# Patient Record
Sex: Female | Born: 1955 | Race: White | Hispanic: No | Marital: Married | Smoking: Former smoker
Health system: Southern US, Community
[De-identification: ages and names within clinical notes are randomized; demographics above are authoritative.]

## PROBLEM LIST (undated history)

## (undated) DIAGNOSIS — I671 Cerebral aneurysm, nonruptured: Secondary | ICD-10-CM

## (undated) DIAGNOSIS — Z9221 Personal history of antineoplastic chemotherapy: Secondary | ICD-10-CM

## (undated) DIAGNOSIS — C50919 Malignant neoplasm of unspecified site of unspecified female breast: Secondary | ICD-10-CM

## (undated) DIAGNOSIS — F419 Anxiety disorder, unspecified: Secondary | ICD-10-CM

## (undated) DIAGNOSIS — I720 Aneurysm of carotid artery: Secondary | ICD-10-CM

## (undated) DIAGNOSIS — M8430XA Stress fracture, unspecified site, initial encounter for fracture: Secondary | ICD-10-CM

## (undated) DIAGNOSIS — T7840XA Allergy, unspecified, initial encounter: Secondary | ICD-10-CM

## (undated) DIAGNOSIS — R232 Flushing: Secondary | ICD-10-CM

## (undated) DIAGNOSIS — C801 Malignant (primary) neoplasm, unspecified: Secondary | ICD-10-CM

## (undated) DIAGNOSIS — N951 Menopausal and female climacteric states: Secondary | ICD-10-CM

## (undated) DIAGNOSIS — Z79810 Long term (current) use of selective estrogen receptor modulators (SERMs): Secondary | ICD-10-CM

## (undated) DIAGNOSIS — Z923 Personal history of irradiation: Secondary | ICD-10-CM

## (undated) DIAGNOSIS — M1990 Unspecified osteoarthritis, unspecified site: Secondary | ICD-10-CM

## (undated) HISTORY — PX: PARTIAL HYSTERECTOMY: SHX80

## (undated) HISTORY — DX: Aneurysm of carotid artery: I72.0

## (undated) HISTORY — PX: OTHER SURGICAL HISTORY: SHX169

## (undated) HISTORY — DX: Cerebral aneurysm, nonruptured: I67.1

## (undated) HISTORY — DX: Malignant (primary) neoplasm, unspecified: C80.1

## (undated) HISTORY — DX: Personal history of antineoplastic chemotherapy: Z92.21

## (undated) HISTORY — DX: Stress fracture, unspecified site, initial encounter for fracture: M84.30XA

## (undated) HISTORY — DX: Allergy, unspecified, initial encounter: T78.40XA

## (undated) HISTORY — DX: Malignant neoplasm of unspecified site of unspecified female breast: C50.919

## (undated) HISTORY — DX: Menopausal and female climacteric states: N95.1

---
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
## 1979-02-17 HISTORY — PX: ABDOMINAL HYSTERECTOMY: SHX81

## 1998-12-25 ENCOUNTER — Emergency Department (HOSPITAL_COMMUNITY): Admission: EM | Admit: 1998-12-25 | Discharge: 1998-12-25 | Payer: Self-pay | Admitting: *Deleted

## 1998-12-25 ENCOUNTER — Encounter: Payer: Self-pay | Admitting: *Deleted

## 2003-02-02 ENCOUNTER — Emergency Department (HOSPITAL_COMMUNITY): Admission: EM | Admit: 2003-02-02 | Discharge: 2003-02-02 | Payer: Self-pay | Admitting: Emergency Medicine

## 2008-06-29 ENCOUNTER — Ambulatory Visit (HOSPITAL_BASED_OUTPATIENT_CLINIC_OR_DEPARTMENT_OTHER): Admission: RE | Admit: 2008-06-29 | Discharge: 2008-06-29 | Payer: Self-pay | Admitting: Orthopedic Surgery

## 2008-06-29 ENCOUNTER — Encounter (INDEPENDENT_AMBULATORY_CARE_PROVIDER_SITE_OTHER): Payer: Self-pay | Admitting: Orthopedic Surgery

## 2008-06-29 HISTORY — PX: TRIGGER FINGER RELEASE: SHX641

## 2009-11-09 ENCOUNTER — Ambulatory Visit: Payer: Self-pay | Admitting: Sports Medicine

## 2009-11-09 DIAGNOSIS — M79609 Pain in unspecified limb: Secondary | ICD-10-CM | POA: Insufficient documentation

## 2009-11-09 DIAGNOSIS — M84376A Stress fracture, unspecified foot, initial encounter for fracture: Secondary | ICD-10-CM | POA: Insufficient documentation

## 2009-11-09 DIAGNOSIS — M2010 Hallux valgus (acquired), unspecified foot: Secondary | ICD-10-CM | POA: Insufficient documentation

## 2009-11-09 HISTORY — DX: Stress fracture, unspecified foot, initial encounter for fracture: M84.376A

## 2010-01-17 ENCOUNTER — Ambulatory Visit: Payer: Self-pay | Admitting: Sports Medicine

## 2010-07-20 NOTE — Assessment & Plan Note (Signed)
Summary: NP,FOOT PAIN,MC   Vital Signs:  Patient profile:   55 year old female Height:      67 inches Weight:      182 pounds BMI:     28.61 BP sitting:   142 / 82  Vitals Entered By: Lillia Pauls CMA (Nov 09, 2009 2:41 PM)  History of Present Illness: This patient has had chronic lat foot pain Hx of multiple knee surgeries on left played a lot of softball and sports  old 3/4 length rigid orthotic  off the shelf sports inserts recently felt OK but not comfortable with current foot prob  4 weeks severe outer foot pain xrays neg at Dr Margreta Journey and he noted still poss stress fx  last week had 2 treatments with acupenture which helped significantly   went back to [facility] w worse pain cam walker placed 2 days ago? felt better stood 8 hrs p this and now with a lot of pain  comes for eval   Allergies (verified): No Known Drug Allergies  Physical Exam  General:  Well-developed,well-nourished,in no acute distress; alert,appropriate and cooperative throughout examination Msk:  2nd, 3rd toe riding over the 1st  1st toe has hallux vallugas  transverse arch collapse callusing under 3rd and 4th metatarsal  pain on palpation of distal shaft of 5th  pain under metatarsal heads of 3rd, 4th   Additional Exam:  MSK US  shows a partial callus and thickening of cortex of distal 5th MT before MTP this is noted on long and trans view edema noted as well no inc in doppler noted 4th MT loosk normal  images saved   Impression & Recommendations:  Problem # 1:  FOOT PAIN, LEFT (ICD-729.5)  This was much improved with placement of MT support into her CROCs very point tender over dorsum whjere we see callus not as much over MT heads or shaft to suggest metatarsalgia though  Orders: [place] LIMITED ([id number])  Problem # 2:  STRESS FRACTURE OF THE METATARSALS (ICD-733.94)  scan and exam very consistent w this  note seems that this must relate to abnormal foot structure and  breakdown  Orders: [place] LIMITED ([id number])  Problem # 3:  HALLUX VALGUS (ICD-735.0) left foot shows lots of transverse arch change and shift ot outside of foot bunion and lat subluxation  will need custom orthotic at some point  reck as noted or sooner for probs  Patient Instructions: 1)  calcium 2000 mg under instructions 2)  vitamin 800 international units  3)  ice it 5-10 min/day  4)  don't stand more than have to  5)  when tired, use camwalker for a while  6)  try not to walk barefoot without support  7)  take 4-6 weeks to heal 8)  recheck in 4-6 weeks

## 2010-07-20 NOTE — Assessment & Plan Note (Signed)
Summary: F/U FOOT,MC   Vital Signs:  Patient profile:   55 year old female BP sitting:   120 / 75  Vitals Entered By: Lillia Pauls CMA (January 17, 2010 10:18 AM)  History of Present Illness: Patient presented a little over a month ago with lateral left foot pain and ultrasound findings that were consistent with a distal fifth metatarsal stress fracture. Overall, the patient reports that she is feeling some small improvement. She used the metatarsal pad for four weeks with initial pain relief, but then increasing soreness that was not tolerable.  Patient has most pain with plantarflexion of left foot. Cam walker seems to help. She is not taking any NSAIDs at the moment, but has used celebrex in past which was helpful, and she is interested in using this again.  Still walking and standing quite a bit, building cornhole boards. 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  Allergies: No Known Drug Allergies  Physical Exam  General:  Well-developed,well-nourished,in no acute distress; alert,appropriate and cooperative throughout examination Msk:  First toe with hallux vallugas, bilaterally and right-sided hallux bunion.  Transverse arch collapse bilaterally with tendency toward supination, as evidenced by callouses on lateral foot and toes.  Pain on palpation of distal shaft of 5th metatarsal without evidence of significant erythema or edema. Additional Exam:  Ultrasound: Proximal 5th MT shows some mild callous formation. Distal 5th MTshows more significant callous formation that has improved and resolved hematoma. No increase in doppler flow.  Images saved.   Impression & Recommendations:  Problem # 1:  FOOT PAIN, LEFT (ICD-729.5)  Patient's foot pain is secondary to stress fracture of the 5th MT of the left foot. She may have started to develop some slight stress fracture reaction on the proximal portion of that metatarsal secondary to increased pressure to the area while  using the MT pad. We will discontinue use of the MT pad and try an arch strap to help with stabilization and healing. May consider orthotics in the future, after actue pain is resolved, to address structural and gait abnormailities that increase her risk of developing stress fractures. She can use celebrex 200mg  one tab daily, as needed for the pain.  Orders: [place] LIMITED ([id number])  Problem # 2:  STRESS FRACTURE OF THE METATARSALS (ICD-733.94)  The clinical and ultrasound exam show some improvement in distal left 5th MT stress fracture. As the patient did not tolerate the metatarsal pad, and sincer her pain is improved, will try the arch strap for support while standing or walking th stabilize the metatarsal for improved healing.  Orders: [place] LIMITED ([id number])  Problem # 3:  HALLUX VALGUS (ICD-735.0) This patient has transverse arch collapse and hallux valgus that are putting increaing pressure on the lateral edges of her feet. Will consider custom orthotics after acute pain is resolved.  Complete Medication List: 1)  Celebrex 200 Mg Caps (Celecoxib) .Marland Kitchen.. 1 by mouth once daily as needed for pain Prescriptions: CELEBREX 200 MG CAPS (CELECOXIB) 1 by mouth once daily as needed for pain  #30 x 4   Entered by:   Lillia Pauls CMA   Authorized by:   Enid Baas MD   Signed by:   Lillia Pauls CMA on 01/17/2010   Method used:   Electronically to        [id number] (retail)       [address]       [place], [place]  [address]  Ph: [phone] or [phone]       Fax: [phone]   RxID:   [id number]

## 2010-08-10 ENCOUNTER — Observation Stay (HOSPITAL_COMMUNITY)
Admission: EM | Admit: 2010-08-10 | Discharge: 2010-08-11 | Disposition: A | Discharge status: Home / Self Care | Payer: 59 | Attending: Internal Medicine | Admitting: Internal Medicine

## 2010-08-10 ENCOUNTER — Emergency Department (HOSPITAL_COMMUNITY): Payer: 59

## 2010-08-10 DIAGNOSIS — R110 Nausea: Secondary | ICD-10-CM | POA: Insufficient documentation

## 2010-08-10 DIAGNOSIS — Z8249 Family history of ischemic heart disease and other diseases of the circulatory system: Secondary | ICD-10-CM | POA: Insufficient documentation

## 2010-08-10 DIAGNOSIS — F17200 Nicotine dependence, unspecified, uncomplicated: Secondary | ICD-10-CM | POA: Insufficient documentation

## 2010-08-10 DIAGNOSIS — R079 Chest pain, unspecified: Principal | ICD-10-CM | POA: Insufficient documentation

## 2010-08-10 LAB — LIPID PANEL
Cholesterol: 167 mg/dL (ref 0–200)
HDL: 36 mg/dL — ABNORMAL LOW (ref >39)
LDL Cholesterol: 101 mg/dL — ABNORMAL HIGH (ref 0–99)
Total CHOL/HDL Ratio: 4.6 RATIO

## 2010-08-10 LAB — DIFFERENTIAL
Basophils Absolute: 0 10*3/uL (ref 0.0–0.1)
Basophils Relative: 1 % (ref 0–1)
Eosinophils Absolute: 0.1 10*3/uL (ref 0.0–0.7)
Eosinophils Relative: 2 % (ref 0–5)
Lymphocytes Relative: 22 % (ref 12–46)
Lymphs Abs: 1.5 10*3/uL (ref 0.7–4.0)
Monocytes Absolute: 0.3 10*3/uL (ref 0.1–1.0)
Monocytes Relative: 4 % (ref 3–12)
Neutro Abs: 4.6 10*3/uL (ref 1.7–7.7)
Neutrophils Relative %: 72 % (ref 43–77)

## 2010-08-10 LAB — POCT CARDIAC MARKERS
CKMB, poc: 2.4 ng/mL (ref 1.0–8.0)
Myoglobin, poc: 57.5 ng/mL (ref 12–200)
Troponin i, poc: <0.05 ng/mL (ref 0.00–0.09)

## 2010-08-10 LAB — CBC
HCT: 39 % (ref 36.0–46.0)
Hemoglobin: 13.8 g/dL (ref 12.0–15.0)
MCH: 31.8 pg (ref 26.0–34.0)
MCHC: 35.4 g/dL (ref 30.0–36.0)
MCV: 89.9 fL (ref 78.0–100.0)
Platelets: 276 10*3/uL (ref 150–400)
RBC: 4.34 MIL/uL (ref 3.87–5.11)
RDW: 12.5 % (ref 11.5–15.5)
WBC: 6.5 10*3/uL (ref 4.0–10.5)

## 2010-08-10 LAB — CARDIAC PANEL(CRET KIN+CKTOT+MB+TROPI)
CK, MB: 4.3 ng/mL — ABNORMAL HIGH (ref 0.3–4.0)
Total CK: 111 U/L (ref 7–177)
Troponin I: 0.01 ng/mL (ref 0.00–0.06)

## 2010-08-10 LAB — COMPREHENSIVE METABOLIC PANEL
ALT: 26 U/L (ref 0–35)
AST: 24 U/L (ref 0–37)
Calcium: 9 mg/dL (ref 8.4–10.5)
GFR calc Af Amer: >60 mL/min (ref >60)
Sodium: 140 mEq/L (ref 135–145)
Total Protein: 6.6 g/dL (ref 6.0–8.3)

## 2010-08-10 LAB — D-DIMER, QUANTITATIVE: D-Dimer, Quant: 0.38 ug/mL-FEU (ref 0.00–0.48)

## 2010-08-10 LAB — CK TOTAL AND CKMB (NOT AT ARMC): Relative Index: 2.8 — ABNORMAL HIGH (ref 0.0–2.5)

## 2010-08-10 LAB — TROPONIN I: Troponin I: 0.01 ng/mL (ref 0.00–0.06)

## 2010-08-10 LAB — GLUCOSE, CAPILLARY: Glucose-Capillary: 113 mg/dL — ABNORMAL HIGH (ref 70–99)

## 2010-08-10 LAB — T3 UPTAKE: T3 Uptake Ratio: 32.2 % (ref 22.5–37.0)

## 2010-08-10 NOTE — H&P (Signed)
NAME:  Emily Knight, MYHRE NO.:  [id number]  MEDICAL RECORD NO.:  [id number]           PATIENT TYPE:  E  LOCATION:  [facility]                         FACILITY:  [facility]  PHYSICIAN:  [name], MD  DATE OF BIRTH:  1956/06/13  DATE OF ADMISSION:  08/10/2010 DATE OF DISCHARGE:                             HISTORY & PHYSICAL   PRIMARY CARE PHYSICIAN:  [name], MD.  History obtainable from the patient.  CHIEF COMPLAINT:  Chest pain, which started today at about 7:30 a.m.  HISTORY OF PRESENT ILLNESS:  The patient is a very pleasant 55 year old Caucasian female with history of cerebral aneurysm, presenting to the emergency room with chest pain, which started at about 7:30 a.m. when the patient woke up from sleep.  The patient claimed that for some months now she has been having a lot of precordial discomfort, which has been intermittent; however, the present episode started when the patient woke up from sleep.  Pain is described as sharp, located in the precordial region about 10/10 intensity radiating to the neck to the left shoulder and also to the left hand with tingling.  Pain was said to be made worse by movement.  She claims she was nauseated, but denied any vomiting.  She denied any diaphoresis.  She denied any fever.  She denied any chills.  She denied any rigor.  Pain was said to have persisted, hence the patient presented to the emergency room to be evaluated.  PAST MEDICAL HISTORY:  Positive for cerebral aneurysm.  PAST SURGICAL HISTORY:  Left knee surgery 3 open and 3 arthroscopically.  MEDICATIONS:  Preadmission meds include, 1. Celebrex 20 mg one p.o. daily p.r.n. 2. Premarin (conjugated estrogens) 0.625 mg 1 p.o. daily.  ALLERGIES:  CODEINE.  SOCIAL HISTORY:  Negative for tobacco use.  Takes alcohol socially and she is a retired Emergency planning/management officer.  FAMILY HISTORY:  York Spaniel to be positive for coronary artery disease. Mother had an open heart  surgery.  REVIEW OF SYSTEMS:  The patient denies any history of headaches.  No blurred vision.  No nausea or vomiting.  No fever.  No chills.  No rigor.  The precordial pain is moderate in intensity.  She denies any systemic symptoms.  No fever.  No chills. No rigors.  She denies any cough.  No abdominal discomfort.  No diarrhea or hematochezia.  No dysuria or hematuria.  No swelling of the lower extremities.  No intolerance to heat or cold and no neuropsychiatric disorder.  PHYSICAL EXAMINATION:  GENERAL:  On examination, middle-aged lady, very pleasant, not in any obvious respiratory distress, well-hydrated. PRESENT VITAL SIGNS:  Blood pressure is 130/82, pulse is 83, respiratory rate 18, temperature is 97.7. HEENT:  Pupils are reactive to light.  Extraocular muscles are intact. NECK:  No jugular venous distention.  No carotid bruit.  No lymphadenopathy. CHEST:  Clear to auscultation. CARDIAC:  Heart Sounds are 1 and 2. ABDOMEN:  Soft and nontender.  Liver, spleen and kidney not palpable. Bowel sounds positive. EXTREMITIES:  No pedal edema. NEUROLOGIC EXAM:  Nonfocal. MUSCULOSKELETAL SYSTEM:  Show arthritic changes in the knees.  NEUROPSYCHIATRIC EVALUATION:  Unremarkable. SKIN:  Showed normal turgor.  LABORATORY DATA:  BNP 32.  First set of cardiac markers troponin-I is 0.01.  Chemistry showed sodium of 140, potassium of 3.9, chloride of 109, bicarb of 23, glucose is 101, BUN is 16, creatinine is 0.69.  LFT normal.  Hematological indices showed WBC of 6.5, hemoglobin of 13.8, hematocrit 39.0, MCV 89.9 with a platelet count of 276, normal differentials.  EKG shows sinus bradycardia with a rate of 56.  No ST- wave elevation or depression noted.  Chest x-ray, not done.  ADMITTING IMPRESSION:  Chest pain, rule out for acute coronary syndrome. Other medical problem: cerebral aneurysm.  PLAN:  Admit the patient to telemetry.  The patient will be given O2 via nasal cannula three  times per minute, aspirin 325 mg p.o. daily, nitroglycerin 0.4 mg sublingual p.r.n. for chest pain, and morphine 2 mg IV q.4 p.r.n. and she will be on Protonix 40 mg IV q.24 for GI prophylaxis and heparin 5000 units subcu daily for DVT prophylaxis. Labs ordered on this patient will include cardiac enzymes q.6 x3.  Lipid panel and thyroid panel, which include TSH, T3, and T4 and also D-dimer stat.  Imaging study to be ordered will include 2-D echo, carotid duplex.  Chest x-ray stat and CT thorax with PE protocol. Ultimately when all the results are obtained, the patient will be evaluated by the cardiologist for possible stress test either as inpatient or outpatient. The patient will be followed and evaluated on day-to-day basis.     [name], MD     CN/MEDQ  D:  08/10/2010  T:  08/10/2010  Job:  [id number]  Electronically Signed by [name]  on 08/10/2010 05:11:20 PM

## 2010-08-11 ENCOUNTER — Inpatient Hospital Stay (HOSPITAL_COMMUNITY): Payer: 59

## 2010-08-11 LAB — CARDIAC PANEL(CRET KIN+CKTOT+MB+TROPI)
CK, MB: 3.3 ng/mL (ref 0.3–4.0)
Relative Index: INVALID (ref 0.0–2.5)
Total CK: 78 U/L (ref 7–177)
Troponin I: 0.01 ng/mL (ref 0.00–0.06)

## 2010-08-12 NOTE — Discharge Summary (Signed)
NAME:  Emily Knight, Emily Knight NO.:  [id number]  MEDICAL RECORD NO.:  [id number]           PATIENT TYPE:  I  LOCATION:  3715                         FACILITY:  [facility]  PHYSICIAN:  [name], MD  DATE OF BIRTH:  06/05/1956  DATE OF ADMISSION:  08/10/2010 DATE OF DISCHARGE:  08/11/2010                        DISCHARGE SUMMARY - REFERRING   PRIMARY CARE PHYSICIAN:  [name], MD.  CONSULTANT CARDIOLOGY:  [name], MD  DISCHARGE DIAGNOSIS: 1. Chest pain, questionable unstable angina. 2. History of cerebral aneurysm.  HISTORY OF PRESENT ILLNESS:  The patient is a very pleasant 55 year old Caucasian female with history of cerebral aneurysm, was admitted to the hospital on August 10, 2010 with precordial chest pain, radiating to the neck and to the left shoulder with tingling sensation in the left arm.  This was associated with nausea, but no vomiting.  She denied any diaphoresis.  She denied any systemic symptoms i.e. no fever, no chills, no rigor.  The pain was said to have persisted, hence the patient presented to the Emergency Room to be evaluated.  PREADMISSION MEDICATIONS:  Include: 1. Celebrex 20 mg p.o. daily. 2. Premarin (conjugated estrogens) 0.625 mg 1 p.o. daily.  ALLERGIES:  CODEINE.  PAST SURGICAL HISTORY:  Left knee surgeries 6 in total, 3 open and 3 arthroscopically.  SOCIAL HISTORY:  Negative for tobacco use.  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  FAMILY HISTORY:  Positive for coronary artery disease.  REVIEW OF SYSTEMS:  Essentially as documented initial history and physical.  PHYSICAL EXAMINATION:  GENERAL:  At time the patient was seen by me she was not in any distress, well-hydrated. VITAL SIGNS:  Blood pressure is 130/82, pulse 83, respiratory rate 18, temperature is 97.7. HEENT:  Pupils are reactive to light.  Extraocular muscles intact. NECK:  No jugular venous distention.  No carotid  bruits.  No lymphadenopathy. CHEST:  Clear to auscultation. HEART:  Heart sounds are 1 and 2. ABDOMEN:  Soft and nontender.  Liver, spleen, and kidney are not palpable.  Bowel sounds are positive. EXTREMITIES: No pedal edema. NEUROLOGIC:  Nonfocal. MUSCULOSKELETAL:  Unremarkable. SKIN:  Normal turgor.  LABORATORY DATA:  Initial complete blood count with differential showed a WBC of 6.5, hemoglobin 13.8, hematocrit 39.0, MCV 89.9 with a platelet count of 276, normal differentials.  First set of cardiac markers troponin-I less than 0.05, 0.01, point less than 0.01 respectively.  BNP 32, D-dimer 0.38.  Lipid panel showed total cholesterol was 67, triglyceride 149, HDL 36, LDL 101.  Thyroid panel; TSH 1.409 normal, T3 of 32.3 normal, and T4 of 8.4 normal.  Imaging studies done include chest x-ray, which showed emphysematous changes in the upper lungs, no evidence of active cardiopulmonary disease.  A 2-D echo was cancelled by the cardiologist Dr. Anne Fu and this was agreeable by me.  HOSPITAL COURSE:  The patient was admitted to telemetry.  She was given aspirin 325 mg p.o. daily.  She was placed on O2 via nasal cannula 2 L per minute and this was eventually discontinued and she was also given nitro 0.4 mg sublingual p.r.n.,  pain control was with morphine, and GI prophylaxis with Protonix and DVT prophylaxis with heparin 5000 subcu q.12 hours.  The patient was also given Vicodin for pain control.  She was evaluated by the in-house cardiologist Dr. Anne Fu who recommended outpatient stress test.  The patient was reevaluated by me today, chest pain free.  Cardiac enzymes are negative.  Examination unremarkable. Vital signs, blood pressure is 107/68, temperature of 97.3, pulse 66, respiratory rate 20.  Medically stable.  PLAN:  For the patient to be discharged home today.  DISCHARGE ACTIVITIES:  As tolerated.  DISCHARGE DIET:  Low-sodium, low-cholesterol diet.  DISCHARGE  INSTRUCTIONS:  She will have a stress test done in  Cardiologist Office on Monday the August 14, 2010.  DISCHARGE MEDICATIONS:  Medication to be taken at home include: 1. Aspirin 325 mg one p.o. daily. 2. Nitroglycerin 0.4 mg sublingual p.r.n. 3. Celebrex 20 mg p.o. daily p.r.n. 4. Premarin (conjugated estrogens) 0.625 mg 1 p.o. daily.     [name], MD     CN/MEDQ  D:  08/11/2010  T:  08/11/2010  Job:  [id number]  cc:   [name], M.D.  Electronically Signed by [name]  on 08/12/2010 06:28:52 AM

## 2010-08-14 NOTE — Consult Note (Signed)
NAMEMarland Knight  Knight, Emily NO.:  [id number]  MEDICAL RECORD NO.:  [id number]           PATIENT TYPE:  I  LOCATION:  3715                         FACILITY:  [facility]  PHYSICIAN:  [name], MD      DATE OF BIRTH:  06/23/1955  DATE OF CONSULTATION: DATE OF DISCHARGE:  08/11/2010                                CONSULTATION   CARDIOLOGIST:  [name], MD  PRIMARY CARE PHYSICIAN:  [name] with [name].  REASON FOR CONSULTATION:  Evaluation of chest pain at the request of Dr. [name], [facility].  HISTORY OF PRESENT ILLNESS:  A 55 year old female, former smoker, nondiabetic, no hypertension, LDL 101 who over the past month has been experiencing sharp substernal chest discomfort at rest while sitting that has been gaining in frequency and intensity.  One episode occurred, for instance,  while sitting in her car and was quite intense.  The episode that brought her to the hospital however occurred after she woke up in the morning, went to the [employer] for breakfast, then sat down and began having quite severe substernal sharp chest pain.  She also woke up at that time with some left arm discomfort but she attributed this to lifting the day prior.  She did not experience any shortness of breath or syncope or dizziness with this discomfort.  She stated that the sharp retrosternal chest discomfort lasted for over an hour duration and was intermittent waxing and waning.  EMS was called and gave her aspirin as well as nitroglycerin and she did state that one nitroglycerin seem to ease the discomfort.  Shortly after arrival in the emergency department her chest pain was gone.  As stated above she has had this chest discomfort off and on over the past month and this has been worrisome to her.  In the year 2007, Dr. [name] had performed a nuclear stress test on her and she exercised for 11 minutes and had no evidence of ischemia on perfusion  images.  She did quit smoking in 2002.  PAST MEDICAL HISTORY: 1. Prior tobacco use - quit in 2002. 2. Hot flashes, perimenopausal - just started Premarin last month. 3. Ankle stress fracture. 4. Partial hysterectomy for fibroids in 1989. 5. Left knee surgery x6, ACL injury. 6. Cerebral aneurysm had been diagnosed previously with cerebral     angiogram.  I do not have the details but her history states that     the aneurysm was encased in bone and would not be touched.  FAMILY HISTORY:  Her brother had bypass surgery at age 76.  Her mother had bypass surgery at age 79 with aortic valve replacement.  SOCIAL HISTORY:  Rare alcohol use, quit tobacco in 2002.  No illicit drug use.  She has close friends with a rapid response nurse here at [facility] who was present during the interview.  MEDICATIONS:  Premarin just started over the past month and she has also been taking Celebrex for her ankle pain.  REVIEW OF SYSTEMS:  Denies any fever, melena, hematemesis, syncope, dizziness, rashes.  Positive for ankle pain as below.  She did have about of diarrhea  that was quite severe within the past month.  Unless expressed above, all other 12 review of systems negative.  Recent hot flashes for which she has been taking Premarin.  PHYSICAL EXAMINATION:  VITAL SIGNS:  Temperature 98.1, pulse 66, respirations 20, blood pressure 121/78 to 107/68, satting 97% on room air. GENERAL:  Alert and oriented x3 in no acute distress, pleasant. HEENT:  Eyes; well-perfused conjunctivae, EOMI, scleral icterus. NECK:  Supple.  No lymphadenopathy.  No thyromegaly.  No carotid bruits. No JVD. CARDIOVASCULAR:  Regular rate and rhythm without any appreciable murmurs, rubs or gallops.  Normal PMI. LUNGS:  Clear to auscultation bilaterally.  Normal respiratory effort. No wheezes.  No rales. ABDOMEN:  Soft, nontender, normoactive bowel sounds.  No rebound.  No guarding.  No bruits. EXTREMITIES:  No clubbing,  cyanosis or edema.  Normal distal pulses. SKIN:  Warm, dry and intact.  No rashes. GU:  Deferred. RECTAL:  Deferred. NEURO:  Nonfocal.  No tremors.  Distal pulses are equal in upper extremities bilaterally.  DATA:  Cardiac biomarkers are negative x3.  TSH is 1.4 and normal.  LDL cholesterol is 101, HDL 36, D-dimer is normal.  BNP was 32, creatinine was 0.6, potassium 3.9, white count 6.5, hemoglobin 13.8, platelets 276. Chest x-ray personally reviewed shows some emphysematous changes in the upper lungs but no evidence of active pulmonary disease.  EKG personally reviewed shows sinus rhythm with no ST-segment changes.  No other abnormalities noted.  ASSESSMENT/PLAN:  A 55 year old female with chest pain.  Chest discomfort has been increasing in frequency and severity according to the patient.  Does have some atypical features to it such as sharpness, nonexertional.  Certainly could be musculoskeletal in etiology.  However given her previous smoking history as well as her brother with bypass surgery at age 65, I do feel that further cardiac workup is warranted. I am currently setting up for a nuclear stress test/Lexiscan given her stress fracture in her ankle to be performed in the office setting. Full details will be given to her.  I have communicated this to [employer].  I have discussed with her the mild risk associated with Celebrex from a cardiovascular standpoint and I have also reassured her that there is no evidence of myocardial infarction based upon cardiac biomarkers and EKG at this point.  I do feel comfortable allowing her to be discharged and to be performing a stress test as an outpatient and I will ensure that she has nitroglycerin prescription.  In the course of any worrisome symptoms develop, she knows to call 911 or to report immediately to the emergency room.  In regards to her LDL, it is 101 and based on her current risk factors this is reasonable.  Of  course if she does end up with a diagnosis of coronary artery disease, we should treat for bolus less than 70.  Once again prior nuclear stress test in 2007 was low- risk.  If nuclear stress test is abnormal, of course, we will proceed with cardiac catheterization.  If symptoms worsened, may proceed with catheterization as well.     [name], MD     MCS/MEDQ  D:  08/11/2010  T:  08/12/2010  Job:  [id number]  cc:   [name], M.D. [name], MD  Electronically Signed by Donato Schultz MD on 08/14/2010 08:02:10 AM

## 2010-08-30 ENCOUNTER — Ambulatory Visit: Admission: RE | Admit: 2010-08-30 | Payer: 59 | Source: Ambulatory Visit | Admitting: Interventional Cardiology

## 2010-08-30 ENCOUNTER — Inpatient Hospital Stay (HOSPITAL_BASED_OUTPATIENT_CLINIC_OR_DEPARTMENT_OTHER)
Admission: RE | Admit: 2010-08-30 | Discharge: 2010-08-30 | Disposition: A | Discharge status: Home / Self Care | Payer: 59 | Source: Ambulatory Visit | Attending: Interventional Cardiology | Admitting: Interventional Cardiology

## 2010-08-30 DIAGNOSIS — R079 Chest pain, unspecified: Secondary | ICD-10-CM | POA: Insufficient documentation

## 2010-08-30 DIAGNOSIS — Z8249 Family history of ischemic heart disease and other diseases of the circulatory system: Secondary | ICD-10-CM | POA: Insufficient documentation

## 2010-10-31 NOTE — Op Note (Signed)
NAMEMarland Kitchen  Emily, Knight NO.:  [id number]   MEDICAL RECORD NO.:  [id number]          PATIENT TYPE:  AMB   LOCATION:  DSC                          FACILITY:  [facility]   PHYSICIAN:  Cindee Salt, M.D.       DATE OF BIRTH:  1956/01/09   DATE OF PROCEDURE:  DATE OF DISCHARGE:                               OPERATIVE REPORT   PREOPERATIVE DIAGNOSIS:  Release of A1 pulley and excision of flexor  sheath cyst, left thumb.   POSTOPERATIVE DIAGNOSIS:  Release of A1 pulley and excision of flexor  sheath cyst, left thumb.   OPERATION:  Release of A1 pulley and excision of cyst, left thumb.   SURGEON:  Cindee Salt, MD   ASSISTANT:  Carolyne Fiscal, RN   ANESTHESIA:  IV regional with local supplementation.   ANESTHESIOLOGIST:  Burna Forts, MD   HISTORY:  The patient is a 55 year old female with a history of a large  mass on the metacarpophalangeal joint crease of her left thumb,  triggering of the left middle finger.  This has not responded to  conservative treatment.  She has elected to undergo surgical release and  excision.  She is aware of risks and complications including infection,  recurrence of injury to arteries, nerves, tendons, incomplete relief of  symptoms, and dystrophy.  In the preoperative area, the patient is seen.  The extremity marked by both the patient and surgeon.  Antibiotic given.   PROCEDURE:  The patient was brought to the operating room where a  forearm-based IV regional anesthetic was carried out without difficulty  under the direction of Dr. Jacklynn Bue.  She was prepped using DuraPrep,  supine position, left arm.  A time-out was taken.  The incision was  made, carried down through the subcutaneous tissue.  The digital nerves  were identified immediately.  The ulnar digital nerve was found draped  over a large cystic mass.  This was freed, taken to the ulnar aspect.  The A1 pulley was identified.  A retractor was placed protecting the  neurovascular  structures and release performed to the A1 pulley in the  radial aspect.  The cyst was then excised taking care to protect the  neurovascular bundle and sent to pathology.  No further lesions were  identified.  The thumb placed through a full range of motion, no further  triggering was evident.  The wound was copiously irrigated with saline.  The skin closed with interrupted 5-0 Vicryl Rapide sutures.  Sterile  compressive dressing was applied with the thumb free.  The patient  tolerated the procedure well and was taken to the recovery room for  observation in satisfactory condition.  On deflation of the tourniquet,  all fingers of the thumb immediately pinked.  She is discharged to home  to return to [facility] in 1 week on [place] [facility].           ______________________________  Cindee Salt, M.D.    GK/MEDQ  D:  06/29/2008  T:  06/30/2008  Job:  [id number]

## 2010-12-28 NOTE — Cardiovascular Report (Signed)
  NAME:  Emily Knight, SGROI NO.:  [id number]  MEDICAL RECORD NO.:  [id number]           PATIENT TYPE:  O  LOCATION:  CATH                         FACILITY:  [facility]  PHYSICIAN:  [name], MDDATE OF BIRTH:  April 29, 1956  DATE OF PROCEDURE:  08/30/2010 DATE OF DISCHARGE:                           CARDIAC CATHETERIZATION   PRIMARY CARE PHYSICIAN:  [name], MD  PROCEDURES PERFORMED: 1. Left heart catheterization. 2. Left ventriculogram. 3. Coronary angiogram. 4. Abdominal aortogram.  OPERATOR:  [name], MD  INDICATIONS:  Refractory chest pain in a patient with significant family history of heart disease.  PROCEDURE NARRATIVE:  The risks and benefits of cardiac catheterization were explained to the patient and informed consent was obtained.  She was brought to the cath lab.  She was prepped and draped in the usual sterile fashion.  Her right groin was infiltrated with 1% lidocaine.  A 4-French sheath was placed into the right common femoral artery using the modified Seldinger technique.  Left coronary artery angiography was performed using a JL-4 pigtail catheter.  The catheter was advanced to the vessel ostium under fluoroscopic guidance.  Digital angiography was performed in multiple projections using hand injection of contrast. Right coronary artery angiography was performed using a JR-4 catheter in a similar fashion.  A pigtail catheter was advanced to the ascending aorta and across the aortic valve under fluoroscopic guidance.  A power injection of contrast was performed in the RAO projection to image the left ventricle.  The catheter was pulled back under continuous hemodynamic pressure monitoring.  The catheter was withdrawn to the abdominal aorta and a power injection of contrast was performed in the AP projection.  FINDINGS:  The left main was a large vessel, widely patent. The left circumflex is a large vessel.  There were 3  obtuse marginal vessels which were medium-sized and widely patent. The left anterior descending is a large vessel proximally.  There is a large first diagonal vessel.  The mid-to-distal LAD is a small vessel. The entire LAD system appeared angiographically widely patent. The right coronary artery is a large dominant vessel.  There are minimal luminal irregularities.  The posterolateral artery and posterior descending artery are medium-sized vessels and appear widely patent. The left ventriculogram showed normal ventricular function with an estimated ejection fraction of 60%.  There is no significant mitral regurgitation.  The ascending aorta appeared normal. The abdominal aortogram showed no abdominal aortic aneurysm.  There are bilateral single renal arteries.  There is no proximal vessel stenosis.  HEMODYNAMICS:  Left ventricular pressure 111/10 with an LVEDP of 16 mmHg.  Aortic pressure 109/61 with a mean aortic pressure of 85 mmHg.  IMPRESSION: 1. No significant coronary artery disease. 2. Normal left ventricular function. 3. No abdominal aortic aneurysm. 4. No renal artery stenosis. 5. Normal hemodynamics.  RECOMMENDATIONS:  Continue aggressive preventive therapy.     [name], MD     JSV/MEDQ  D:  08/30/2010  T:  08/31/2010  Job:  [id number]  Electronically Signed by Lance Muss MD on 10/04/2010 02:22:12 PM

## 2011-02-13 ENCOUNTER — Other Ambulatory Visit: Payer: Self-pay | Admitting: Family Medicine

## 2011-02-13 DIAGNOSIS — I729 Aneurysm of unspecified site: Secondary | ICD-10-CM

## 2011-02-14 ENCOUNTER — Other Ambulatory Visit: Payer: Self-pay | Admitting: Sports Medicine

## 2011-02-21 ENCOUNTER — Ambulatory Visit
Admission: RE | Admit: 2011-02-21 | Discharge: 2011-02-21 | Disposition: A | Discharge status: Home / Self Care | Payer: 59 | Source: Ambulatory Visit | Attending: Family Medicine | Admitting: Family Medicine

## 2011-02-21 DIAGNOSIS — I729 Aneurysm of unspecified site: Secondary | ICD-10-CM

## 2011-07-24 ENCOUNTER — Other Ambulatory Visit: Payer: Self-pay | Admitting: Radiology

## 2011-07-25 ENCOUNTER — Other Ambulatory Visit: Payer: Self-pay | Admitting: Radiology

## 2011-07-25 DIAGNOSIS — C50912 Malignant neoplasm of unspecified site of left female breast: Secondary | ICD-10-CM

## 2011-07-26 ENCOUNTER — Encounter (INDEPENDENT_AMBULATORY_CARE_PROVIDER_SITE_OTHER): Payer: Self-pay | Admitting: General Surgery

## 2011-07-26 ENCOUNTER — Ambulatory Visit (INDEPENDENT_AMBULATORY_CARE_PROVIDER_SITE_OTHER): Payer: 59 | Admitting: General Surgery

## 2011-07-26 VITALS — BP 142/88 | HR 70 | Resp 18 | Ht 67.0 in | Wt 183.0 lb

## 2011-07-26 DIAGNOSIS — C50419 Malignant neoplasm of upper-outer quadrant of unspecified female breast: Secondary | ICD-10-CM

## 2011-07-26 NOTE — Progress Notes (Signed)
Patient ID: Emily Knight, female   DOB: [birth year], 56 y.o.   MRN: [id number] HPI Emily Knight is a 56 y.o. female.  Referred by Dr. Rogelia Mire HPI This is a 56 year old female who has a history of abnormal mammograms on the right side. She was then followed up for her routine yearly exam when she had a mass in the left upper outer quadrant that was spiculated measuring 1.5 cm in diameter noted. There is some adjacent architectural distortion. Ultrasound of the left breast showed an ill-defined, hypoechoic mass in the 2:30 position about 4 cm from the nipple measuring 15 mm in diameter. There also were some cystic lesions in the 11:00 position an ultrasound of the right breast also showed several cystic-appearing lesions. She underwent ultrasound-guided core biopsy with clip placement that shows an invasive mammary carcinoma that is grade 2. This has now been confirmed as a lobular cancer after each adherent staining was performed. She reports no complaints referable to her breasts and cannot identify the mass and denies any discharge. She comes in today to discuss her options. She has an MRI scheduled for the 12th. Her prognostic panel is still pending at this time.  Past Medical History  Diagnosis Date  . Allergy   . Cancer     left breast    Past Surgical History  Procedure Date  . Left knee reconstruction     six surgeries  . Tonsillectomy and adenoidectomy 1971  . Partial hysterectomy 1984    History reviewed. No pertinent family history.  Social History History  Substance Use Topics  . Smoking status: Former Games developer  . Smokeless tobacco: Not on file  . Alcohol Use: Yes    Allergies  Allergen Reactions  . Codeine Nausea And Vomiting    No current outpatient prescriptions on file.    Review of Systems Review of Systems  Constitutional: Negative for fever, chills and unexpected weight change.  HENT: Negative for hearing loss, congestion, sore throat, trouble  swallowing and voice change.   Eyes: Negative for visual disturbance.  Respiratory: Negative for cough and wheezing.   Cardiovascular: Negative for chest pain, palpitations and leg swelling.  Gastrointestinal: Negative for nausea, vomiting, abdominal pain, diarrhea, constipation, blood in stool, abdominal distention and anal bleeding.  Genitourinary: Negative for hematuria, vaginal bleeding and difficulty urinating.  Musculoskeletal: Positive for arthralgias.  Skin: Negative for rash and wound.  Neurological: Negative for seizures, syncope and headaches.  Hematological: Negative for adenopathy. Bruises/bleeds easily.  Psychiatric/Behavioral: Negative for confusion.    Blood pressure 142/88, pulse 70, resp. rate 18, height 5\' 7"  (1.702 m), weight 183 lb (83.008 kg).  Physical Exam Physical Exam  Vitals reviewed. Constitutional: She appears well-developed and well-nourished.  Eyes: No scleral icterus.  Neck: Neck supple.  Cardiovascular: Normal rate, regular rhythm and normal heart sounds.   Pulmonary/Chest: Effort normal and breath sounds normal. She has no wheezes. She has no rales. Right breast exhibits no inverted nipple, no mass, no nipple discharge, no skin change and no tenderness. Left breast exhibits no inverted nipple, no mass, no nipple discharge, no skin change and no tenderness. Breasts are symmetrical.    Abdominal: Soft. There is no hepatomegaly.  Lymphadenopathy:    She has no cervical adenopathy.    She has no axillary adenopathy.       Right: No supraclavicular adenopathy present.       Left: No supraclavicular adenopathy present.    Data Reviewed MMG, pathology reviewed  Assessment  Left breast cancer, clinical stage I, lobular    Plan    We discussed breast conservation therapy with wire guided lumpectomy, snbx today pending results of MRI.  I think reasonable to get MRI due to breast density, lobular cancer and other abnormalities she was followed for.   We will follow up after MRI and discuss final options.  We discussed at length the pros and cons of breast MRI.   We discussed the staging and pathophysiology of breast cancer. We discussed all of the different options for treatment for breast cancer including surgery, chemotherapy, radiation therapy, Herceptin, and antiestrogen therapy.   We discussed a sentinel lymph node biopsy as she does not appear to having lymph node involvement right now. We discussed the performance of that with injection of radioactive tracer and blue dye. We discussed that she would have an incision underneath her axillary hairline. We discussed that there is a bout a 10-20% chance of having a positive node with a sentinel lymph node biopsy and we will await the permanent pathology to make any other first further decisions in terms of her treatment. One of these options might be to return to the operating room to perform an axillary lymph node dissection. We discussed about a 1-2% risk lifetime of chronic shoulder pain as well as lymphedema associated with a sentinel lymph node biopsy.  We discussed the options for treatment of the breast cancer which included lumpectomy versus a mastectomy. We discussed the performance of the lumpectomy with a wire placement. We discussed a 10-20% chance of a positive margin requiring reexcision in the operating room. We also discussed that she may need radiation therapy or antiestrogen therapy or both if she undergoes lumpectomy. We discussed the mastectomy and the postoperative care for that as well. We discussed that there is no difference in her survival whether she undergoes lumpectomy with radiation therapy or antiestrogen therapy versus a mastectomy. There is a slight difference in the local recurrence rate being 3-5% with lumpectomy and about 1% with a mastectomy. We discussed the risks of operation including bleeding, infection, possible reoperation. She understands her further therapy  will be based on what her stages at the time of her operation.

## 2011-07-31 ENCOUNTER — Ambulatory Visit
Admission: RE | Admit: 2011-07-31 | Discharge: 2011-07-31 | Disposition: A | Discharge status: Home / Self Care | Payer: 59 | Source: Ambulatory Visit | Attending: Radiology | Admitting: Radiology

## 2011-07-31 ENCOUNTER — Other Ambulatory Visit (INDEPENDENT_AMBULATORY_CARE_PROVIDER_SITE_OTHER): Payer: Self-pay | Admitting: General Surgery

## 2011-07-31 ENCOUNTER — Other Ambulatory Visit: Payer: Self-pay | Admitting: Radiology

## 2011-07-31 DIAGNOSIS — C50912 Malignant neoplasm of unspecified site of left female breast: Secondary | ICD-10-CM

## 2011-07-31 DIAGNOSIS — C50919 Malignant neoplasm of unspecified site of unspecified female breast: Secondary | ICD-10-CM

## 2011-07-31 DIAGNOSIS — Z7729 Contact with and (suspected ) exposure to other hazardous substances: Secondary | ICD-10-CM

## 2011-07-31 MED ORDER — GADOBENATE DIMEGLUMINE 529 MG/ML IV SOLN: 17.0000 mL | Freq: Once | INTRAVENOUS | Status: DC | PRN

## 2011-08-01 ENCOUNTER — Telehealth (INDEPENDENT_AMBULATORY_CARE_PROVIDER_SITE_OTHER): Payer: Self-pay

## 2011-08-01 NOTE — Telephone Encounter (Signed)
Called pt to let her know that Dr Dwain Sarna did review her breast MRI today and we will be able to do a lumpectomy. Dr Dwain Sarna will go over the results with pt for her appt in the am but he wanted her to know that there were no other area's of concern beside the know breast cancer. The pt understands and will see [place] tomorrow.

## 2011-08-02 ENCOUNTER — Encounter (INDEPENDENT_AMBULATORY_CARE_PROVIDER_SITE_OTHER): Payer: Self-pay | Admitting: General Surgery

## 2011-08-02 ENCOUNTER — Ambulatory Visit (INDEPENDENT_AMBULATORY_CARE_PROVIDER_SITE_OTHER): Payer: 59 | Admitting: General Surgery

## 2011-08-02 VITALS — BP 142/82 | HR 74 | Resp 16 | Ht 67.0 in | Wt 186.0 lb

## 2011-08-02 DIAGNOSIS — C50919 Malignant neoplasm of unspecified site of unspecified female breast: Secondary | ICD-10-CM

## 2011-08-02 NOTE — Progress Notes (Signed)
Subjective:     Patient ID: Emily Knight, female   DOB: Nov 11, 1955, 56 y.o.   MRN: [id number]  HPI 56 yof with newly diagnosed left breast cancer.  She has now undergone MRI which shows a little bit larger area on the left side but no other abnormality.  Her 2 cannot be performed, er and pr are weakly positive at 30 and 10%. Ki67 is 30.  We discussed her MRI results today at length.     Review of Systems BILATERAL BREAST MRI WITH AND WITHOUT CONTRAST  Technique: Multiplanar, multisequence MR images of both breasts  were obtained prior to and following the intravenous administration  of 17ml of multihance. Three dimensional images were evaluated at  the independent DynaCad workstation.  Comparison: Mammograms from [facility] dated 07/24/2011,  07/23/2011 and 06/30/2010  Findings: There is a moderate background parenchymal enhancement  pattern.  In the lateral aspect of the left breast there is an irregular,  enhancing mass with satellite nodularity measuring 2.2 x 1.4 x 2.5  cm in anterior-posterior, transverse and longitudinal dimensions.  It is associated with a biopsy clip artifact.  There is no enlarged axillary or internal mammary adenopathy.  No abnormal enhancement is seen in the right breast.  IMPRESSION:  Solitary enhancing mass with adjacent satellite nodularity in the  lateral aspect of the left breast corresponding well with the known  malignancy. Treatment planning is recommended.  THREE-DIMENSIONAL MR IMAGE RENDERING ON INDEPENDENT WORKSTATION:  Three-dimensional MR images were rendered by post-processing of the  original MR data on an independent workstation. The three-  dimensional MR images were interpreted, and findings were reported  in the accompanying complete MRI report for this study.  BI-RADS CATEGORY 6: Known biopsy-proven malignancy - appropriate  action should be taken.     Objective:   Physical Exam     Assessment:     Left breast  cancer    Plan:     We discussed MRI findings.  I still think wire guide lumpectomy and sentinel node biopsy are reasonable.  We again discussed possibility of returning for positive margins as well as all treatments being possibilities after surgery.

## 2011-08-03 ENCOUNTER — Encounter (HOSPITAL_BASED_OUTPATIENT_CLINIC_OR_DEPARTMENT_OTHER): Payer: Self-pay | Admitting: *Deleted

## 2011-08-03 ENCOUNTER — Encounter (HOSPITAL_BASED_OUTPATIENT_CLINIC_OR_DEPARTMENT_OTHER)
Admission: RE | Admit: 2011-08-03 | Discharge: 2011-08-03 | Disposition: A | Discharge status: Home / Self Care | Payer: 59 | Source: Ambulatory Visit | Attending: General Surgery | Admitting: General Surgery

## 2011-08-03 NOTE — Pre-Procedure Instructions (Signed)
To come for CBC, CMET, CA 27.29, EKG, CXR

## 2011-08-03 NOTE — Pre-Procedure Instructions (Signed)
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 OK to come for surg.

## 2011-08-09 ENCOUNTER — Encounter (HOSPITAL_BASED_OUTPATIENT_CLINIC_OR_DEPARTMENT_OTHER)
Admission: RE | Admit: 2011-08-09 | Discharge: 2011-08-09 | Disposition: A | Discharge status: Home / Self Care | Payer: 59 | Source: Ambulatory Visit | Attending: General Surgery | Admitting: General Surgery

## 2011-08-09 ENCOUNTER — Other Ambulatory Visit: Payer: Self-pay

## 2011-08-09 ENCOUNTER — Ambulatory Visit
Admission: RE | Admit: 2011-08-09 | Discharge: 2011-08-09 | Disposition: A | Discharge status: Home / Self Care | Payer: 59 | Source: Ambulatory Visit | Attending: General Surgery | Admitting: General Surgery

## 2011-08-09 LAB — COMPREHENSIVE METABOLIC PANEL
ALT: 54 U/L — ABNORMAL HIGH (ref 0–35)
AST: 29 U/L (ref 0–37)
Albumin: 3.9 g/dL (ref 3.5–5.2)
Alkaline Phosphatase: 55 U/L (ref 39–117)
Calcium: 10.1 mg/dL (ref 8.4–10.5)
GFR calc Af Amer: >90 mL/min (ref >90)
Glucose, Bld: 98 mg/dL (ref 70–99)
Potassium: 3.8 mEq/L (ref 3.5–5.1)
Sodium: 139 mEq/L (ref 135–145)
Total Protein: 7.3 g/dL (ref 6.0–8.3)

## 2011-08-09 LAB — CBC
HCT: 39.5 % (ref 36.0–46.0)
Hemoglobin: 14.2 g/dL (ref 12.0–15.0)
RDW: 11.6 % (ref 11.5–15.5)
WBC: 7.8 10*3/uL (ref 4.0–10.5)

## 2011-08-09 LAB — CANCER ANTIGEN 27.29: CA 27.29: 22 U/mL (ref 0–39)

## 2011-08-10 ENCOUNTER — Other Ambulatory Visit (INDEPENDENT_AMBULATORY_CARE_PROVIDER_SITE_OTHER): Payer: Self-pay | Admitting: General Surgery

## 2011-08-10 DIAGNOSIS — Z01818 Encounter for other preprocedural examination: Secondary | ICD-10-CM

## 2011-08-14 ENCOUNTER — Ambulatory Visit (HOSPITAL_COMMUNITY)
Admission: RE | Admit: 2011-08-14 | Discharge: 2011-08-14 | Disposition: A | Discharge status: Home / Self Care | Payer: 59 | Source: Ambulatory Visit | Attending: General Surgery | Admitting: General Surgery

## 2011-08-14 ENCOUNTER — Encounter (HOSPITAL_BASED_OUTPATIENT_CLINIC_OR_DEPARTMENT_OTHER): Payer: Self-pay | Admitting: Certified Registered Nurse Anesthetist

## 2011-08-14 ENCOUNTER — Ambulatory Visit (HOSPITAL_BASED_OUTPATIENT_CLINIC_OR_DEPARTMENT_OTHER)
Admission: RE | Admit: 2011-08-14 | Discharge: 2011-08-14 | Disposition: A | Discharge status: Home / Self Care | Payer: 59 | Source: Ambulatory Visit | Attending: General Surgery | Admitting: General Surgery

## 2011-08-14 ENCOUNTER — Encounter (HOSPITAL_BASED_OUTPATIENT_CLINIC_OR_DEPARTMENT_OTHER)
Admission: RE | Disposition: A | Discharge status: Home / Self Care | Payer: Self-pay | Source: Ambulatory Visit | Attending: General Surgery

## 2011-08-14 ENCOUNTER — Ambulatory Visit (HOSPITAL_BASED_OUTPATIENT_CLINIC_OR_DEPARTMENT_OTHER): Payer: 59 | Admitting: Certified Registered Nurse Anesthetist

## 2011-08-14 ENCOUNTER — Encounter (HOSPITAL_BASED_OUTPATIENT_CLINIC_OR_DEPARTMENT_OTHER): Payer: Self-pay | Admitting: *Deleted

## 2011-08-14 DIAGNOSIS — C50919 Malignant neoplasm of unspecified site of unspecified female breast: Secondary | ICD-10-CM | POA: Insufficient documentation

## 2011-08-14 DIAGNOSIS — Z170 Estrogen receptor positive status [ER+]: Secondary | ICD-10-CM | POA: Insufficient documentation

## 2011-08-14 DIAGNOSIS — Z01810 Encounter for preprocedural cardiovascular examination: Secondary | ICD-10-CM | POA: Insufficient documentation

## 2011-08-14 DIAGNOSIS — Z01818 Encounter for other preprocedural examination: Secondary | ICD-10-CM

## 2011-08-14 DIAGNOSIS — Z01812 Encounter for preprocedural laboratory examination: Secondary | ICD-10-CM | POA: Insufficient documentation

## 2011-08-14 HISTORY — PX: BREAST LUMPECTOMY: SHX2

## 2011-08-14 HISTORY — DX: Unspecified osteoarthritis, unspecified site: M19.90

## 2011-08-14 LAB — POCT HEMOGLOBIN-HEMACUE: Hemoglobin: 15.9 g/dL — ABNORMAL HIGH (ref 12.0–15.0)

## 2011-08-14 SURGERY — BREAST LUMPECTOMY WITH NEEDLE LOCALIZATION AND AXILLARY SENTINEL LYMPH NODE BX
Anesthesia: General | Site: Breast | Laterality: Left | Wound class: Clean

## 2011-08-14 MED ORDER — EPHEDRINE SULFATE 50 MG/ML IJ SOLN
INTRAMUSCULAR | Status: DC | PRN
  Administered 2011-08-14: 10 mg via INTRAVENOUS

## 2011-08-14 MED ORDER — FENTANYL CITRATE 0.05 MG/ML IJ SOLN
INTRAMUSCULAR | Status: DC | PRN
  Administered 2011-08-14: 25 ug via INTRAVENOUS
  Administered 2011-08-14: 50 ug via INTRAVENOUS
  Administered 2011-08-14: 25 ug via INTRAVENOUS

## 2011-08-14 MED ORDER — BUPIVACAINE HCL (PF) 0.25 % IJ SOLN
INTRAMUSCULAR | Status: DC | PRN
  Administered 2011-08-14: 11 mL

## 2011-08-14 MED ORDER — TECHNETIUM TC 99M SULFUR COLLOID FILTERED
1.0000 | Freq: Once | INTRAVENOUS | Status: AC | PRN
  Administered 2011-08-14: 1 via INTRADERMAL

## 2011-08-14 MED ORDER — METOCLOPRAMIDE HCL 5 MG/ML IJ SOLN: 10.0000 mg | Freq: Once | INTRAMUSCULAR | Status: DC | PRN

## 2011-08-14 MED ORDER — ACETAMINOPHEN 10 MG/ML IV SOLN
1000.0000 mg | Freq: Once | INTRAVENOUS | Status: AC
  Administered 2011-08-14: 1000 mg via INTRAVENOUS

## 2011-08-14 MED ORDER — FENTANYL CITRATE 0.05 MG/ML IJ SOLN
50.0000 ug | INTRAMUSCULAR | Status: DC | PRN
  Administered 2011-08-14: 100 ug via INTRAVENOUS

## 2011-08-14 MED ORDER — ONDANSETRON HCL 4 MG/2ML IJ SOLN
INTRAMUSCULAR | Status: DC | PRN
  Administered 2011-08-14: 4 mg via INTRAVENOUS

## 2011-08-14 MED ORDER — MIDAZOLAM HCL 2 MG/2ML IJ SOLN
0.5000 mg | INTRAMUSCULAR | Status: DC | PRN
  Administered 2011-08-14: 2 mg via INTRAVENOUS

## 2011-08-14 MED ORDER — PROPOFOL 10 MG/ML IV EMUL
INTRAVENOUS | Status: DC | PRN
  Administered 2011-08-14: 200 mg via INTRAVENOUS

## 2011-08-14 MED ORDER — DEXAMETHASONE SODIUM PHOSPHATE 4 MG/ML IJ SOLN
INTRAMUSCULAR | Status: DC | PRN
  Administered 2011-08-14: 1 mg via INTRAVENOUS

## 2011-08-14 MED ORDER — MIDAZOLAM HCL 5 MG/5ML IJ SOLN
INTRAMUSCULAR | Status: DC | PRN
  Administered 2011-08-14: 1 mg via INTRAVENOUS

## 2011-08-14 MED ORDER — SCOPOLAMINE 1 MG/3DAYS TD PT72
1.0000 | MEDICATED_PATCH | TRANSDERMAL | Status: DC
  Administered 2011-08-14: 1.5 mg via TRANSDERMAL

## 2011-08-14 MED ORDER — CEFAZOLIN SODIUM 1-5 GM-% IV SOLN
1.0000 g | INTRAVENOUS | Status: AC
  Administered 2011-08-14: 2 g via INTRAVENOUS

## 2011-08-14 MED ORDER — HYDROMORPHONE HCL PF 1 MG/ML IJ SOLN
0.2500 mg | INTRAMUSCULAR | Status: DC | PRN
  Administered 2011-08-14 (×3): 0.5 mg via INTRAVENOUS

## 2011-08-14 MED ORDER — OXYCODONE HCL 5 MG PO TABS: 5.0000 mg | ORAL_TABLET | Freq: Once | ORAL | Status: DC | PRN

## 2011-08-14 MED ORDER — LACTATED RINGERS IV SOLN
INTRAVENOUS | Status: DC
  Administered 2011-08-14 (×2): via INTRAVENOUS

## 2011-08-14 MED ORDER — METOCLOPRAMIDE HCL 5 MG/ML IJ SOLN
INTRAMUSCULAR | Status: DC | PRN
  Administered 2011-08-14: 10 mg via INTRAVENOUS

## 2011-08-14 MED ORDER — SODIUM CHLORIDE 0.9 % IJ SOLN
INTRAMUSCULAR | Status: DC | PRN
  Administered 2011-08-14: 10:00:00 via INTRAMUSCULAR

## 2011-08-14 MED ORDER — OXYCODONE-ACETAMINOPHEN 5-325 MG PO TABS
1.0000 | ORAL_TABLET | Freq: Four times a day (QID) | ORAL | Status: AC | PRN
Start: 2011-08-14 — End: 2011-08-24

## 2011-08-14 MED ORDER — LIDOCAINE HCL (CARDIAC) 20 MG/ML IV SOLN
INTRAVENOUS | Status: DC | PRN
  Administered 2011-08-14: 60 mg via INTRAVENOUS

## 2011-08-14 MED ORDER — OXYCODONE HCL 5 MG PO TABS
5.0000 mg | ORAL_TABLET | ORAL | Status: DC | PRN
  Administered 2011-08-14: 5 mg via ORAL

## 2011-08-14 SURGICAL SUPPLY — 59 items
APPLIER CLIP 9.375 MED OPEN (MISCELLANEOUS) ×2
BENZOIN TINCTURE PRP APPL 2/3 (GAUZE/BANDAGES/DRESSINGS) ×2 IMPLANT
BINDER BREAST LRG (GAUZE/BANDAGES/DRESSINGS) IMPLANT
BINDER BREAST MEDIUM (GAUZE/BANDAGES/DRESSINGS) IMPLANT
BINDER BREAST XLRG (GAUZE/BANDAGES/DRESSINGS) ×2 IMPLANT
BINDER BREAST XXLRG (GAUZE/BANDAGES/DRESSINGS) IMPLANT
BLADE SURG 15 STRL LF DISP TIS (BLADE) ×1 IMPLANT
BLADE SURG 15 STRL SS (BLADE) ×1
BNDG COHESIVE 4X5 TAN STRL (GAUZE/BANDAGES/DRESSINGS) IMPLANT
CANISTER SUCTION 1200CC (MISCELLANEOUS) ×2 IMPLANT
CHLORAPREP W/TINT 26ML (MISCELLANEOUS) ×2 IMPLANT
CLIP APPLIE 9.375 MED OPEN (MISCELLANEOUS) ×1 IMPLANT
CLOTH BEACON ORANGE TIMEOUT ST (SAFETY) ×2 IMPLANT
COVER MAYO STAND STRL (DRAPES) ×2 IMPLANT
COVER PROBE W GEL 5X96 (DRAPES) ×2 IMPLANT
COVER TABLE BACK 60X90 (DRAPES) ×2 IMPLANT
DECANTER SPIKE VIAL GLASS SM (MISCELLANEOUS) IMPLANT
DERMABOND ADVANCED (GAUZE/BANDAGES/DRESSINGS)
DERMABOND ADVANCED .7 DNX12 (GAUZE/BANDAGES/DRESSINGS) IMPLANT
DEVICE DUBIN W/COMP PLATE 8390 (MISCELLANEOUS) ×2 IMPLANT
DRAIN CHANNEL 19F RND (DRAIN) IMPLANT
DRAPE LAPAROSCOPIC ABDOMINAL (DRAPES) ×2 IMPLANT
DRAPE U-SHAPE 76X120 STRL (DRAPES) IMPLANT
DRSG TEGADERM 4X4.75 (GAUZE/BANDAGES/DRESSINGS) ×4 IMPLANT
ELECT COATED BLADE 2.86 ST (ELECTRODE) ×2 IMPLANT
ELECT REM PT RETURN 9FT ADLT (ELECTROSURGICAL) ×2
ELECTRODE REM PT RTRN 9FT ADLT (ELECTROSURGICAL) ×1 IMPLANT
EVACUATOR SILICONE 100CC (DRAIN) IMPLANT
GAUZE SPONGE 4X4 12PLY STRL LF (GAUZE/BANDAGES/DRESSINGS) ×2 IMPLANT
GLOVE BIO SURGEON STRL SZ7 (GLOVE) ×4 IMPLANT
GLOVE BIOGEL PI IND STRL 7.5 (GLOVE) ×1 IMPLANT
GLOVE BIOGEL PI INDICATOR 7.5 (GLOVE) ×1
GOWN PREVENTION PLUS XLARGE (GOWN DISPOSABLE) ×4 IMPLANT
KIT MARKER MARGIN INK (KITS) ×2 IMPLANT
NDL SAFETY ECLIPSE 18X1.5 (NEEDLE) ×1 IMPLANT
NEEDLE HYPO 18GX1.5 SHARP (NEEDLE) ×1
NEEDLE HYPO 25X1 1.5 SAFETY (NEEDLE) ×4 IMPLANT
NS IRRIG 1000ML POUR BTL (IV SOLUTION) ×2 IMPLANT
PACK BASIN DAY SURGERY FS (CUSTOM PROCEDURE TRAY) ×2 IMPLANT
PENCIL BUTTON HOLSTER BLD 10FT (ELECTRODE) ×2 IMPLANT
PIN SAFETY STERILE (MISCELLANEOUS) IMPLANT
SLEEVE SCD COMPRESS KNEE MED (MISCELLANEOUS) ×2 IMPLANT
SPONGE LAP 18X18 X RAY DECT (DISPOSABLE) IMPLANT
SPONGE LAP 4X18 X RAY DECT (DISPOSABLE) ×4 IMPLANT
STOCKINETTE IMPERVIOUS LG (DRAPES) IMPLANT
STRIP CLOSURE SKIN 1/2X4 (GAUZE/BANDAGES/DRESSINGS) ×2 IMPLANT
SUT MNCRL AB 4-0 PS2 18 (SUTURE) ×4 IMPLANT
SUT SILK 2 0 SH (SUTURE) ×2 IMPLANT
SUT VIC AB 2-0 SH 27 (SUTURE) ×2
SUT VIC AB 2-0 SH 27XBRD (SUTURE) ×2 IMPLANT
SUT VIC AB 3-0 SH 27 (SUTURE) ×2
SUT VIC AB 3-0 SH 27X BRD (SUTURE) ×2 IMPLANT
SUT VICRYL AB 3 0 TIES (SUTURE) IMPLANT
SYR CONTROL 10ML LL (SYRINGE) ×4 IMPLANT
TOWEL OR 17X24 6PK STRL BLUE (TOWEL DISPOSABLE) ×2 IMPLANT
TOWEL OR NON WOVEN STRL DISP B (DISPOSABLE) ×2 IMPLANT
TUBE CONNECTING 20X1/4 (TUBING) ×2 IMPLANT
WATER STERILE IRR 1000ML POUR (IV SOLUTION) IMPLANT
YANKAUER SUCT BULB TIP NO VENT (SUCTIONS) ×2 IMPLANT

## 2011-08-14 NOTE — Progress Notes (Signed)
Pt tolerated well ,  Family at bedside

## 2011-08-14 NOTE — Anesthesia Postprocedure Evaluation (Signed)
Anesthesia Post Note  Patient: Emily Knight  Procedure(s) Performed: Procedure(s) (LRB): BREAST LUMPECTOMY WITH NEEDLE LOCALIZATION AND AXILLARY SENTINEL LYMPH NODE BX (Left)  Anesthesia type: General  Patient location: PACU  Post pain: Pain level controlled  Post assessment: Patient's Cardiovascular Status Stable  Last Vitals:  Filed Vitals:   08/14/11 1207  BP: 132/72  Pulse: 70  Temp: 36.7 C  Resp: 18    Post vital signs: Reviewed and stable  Level of consciousness: alert  Complications: No apparent anesthesia complications

## 2011-08-14 NOTE — Interval H&P Note (Signed)
History and Physical Interval Note:  08/14/2011 9:33 AM  Emily Knight  has presented today for surgery, with the diagnosis of breast cance  The various methods of treatment have been discussed with the patient and family. After consideration of risks, benefits and other options for treatment, the patient has consented to  Procedure(s) (LRB): Left BREAST LUMPECTOMY WITH NEEDLE LOCALIZATION AND AXILLARY SENTINEL LYMPH NODE BX (Left) as a surgical intervention .  The patients' history has been reviewed, patient examined, no change in status, stable for surgery.  I have reviewed the patients' chart and labs.  Questions were answered to the patient's satisfaction.     [name]

## 2011-08-14 NOTE — Anesthesia Procedure Notes (Signed)
Procedure Name: LMA Insertion Date/Time: 08/14/2011 9:42 AM Performed by: Amariana Mirando D Pre-anesthesia Checklist: Patient identified, Emergency Drugs available, Suction available and Patient being monitored Patient Re-evaluated:Patient Re-evaluated prior to inductionOxygen Delivery Method: Circle System Utilized Preoxygenation: Pre-oxygenation with 100% oxygen Intubation Type: IV induction Ventilation: Mask ventilation without difficulty LMA: LMA inserted LMA Size: 4.0 Number of attempts: 1 Placement Confirmation: positive ETCO2 Tube secured with: Tape Dental Injury: Teeth and Oropharynx as per pre-operative assessment

## 2011-08-14 NOTE — Transfer of Care (Signed)
Immediate Anesthesia Transfer of Care Note  Patient: Emily Knight  Procedure(s) Performed: Procedure(s) (LRB): BREAST LUMPECTOMY WITH NEEDLE LOCALIZATION AND AXILLARY SENTINEL LYMPH NODE BX (Left)  Patient Location: PACU  Anesthesia Type: General  Level of Consciousness: awake, alert , oriented and patient cooperative  Airway & Oxygen Therapy: Patient Spontanous Breathing and Patient connected to face mask oxygen  Post-op Assessment: Report given to PACU RN and Post -op Vital signs reviewed and stable  Post vital signs: Reviewed and stable  Complications: No apparent anesthesia complications

## 2011-08-14 NOTE — Discharge Instructions (Signed)
[facility] Office Phone Number [phone]  BREAST BIOPSY/ PARTIAL MASTECTOMY: POST OP INSTRUCTIONS  Always review your discharge instruction sheet given to you by the facility where your surgery was performed.  IF YOU HAVE DISABILITY OR FAMILY LEAVE FORMS, YOU MUST BRING THEM TO THE OFFICE FOR PROCESSING.  DO NOT GIVE THEM TO YOUR DOCTOR.  1. A prescription for pain medication may be given to you upon discharge.  Take your pain medication as prescribed, if needed.  If narcotic pain medicine is not needed, then you may take acetaminophen (Tylenol), naprosyn (Alleve) or ibuprofen (Advil) as needed. 2. Take your usually prescribed medications unless otherwise directed 3. If you need a refill on your pain medication, please contact your pharmacy.  They will contact our office to request authorization.  Prescriptions will not be filled after 5pm or on week-ends. 4. You should eat very light the first 24 hours after surgery, such as soup, crackers, pudding, etc.  Resume your normal diet the day after surgery. 5. Most patients will experience some swelling and bruising in the breast.  Ice packs and a good support bra will help.  Wear the breast binder provided or a sports bra for 72 hours day and night.  After that wear a sports bra during the day until you return to the office. Swelling and bruising can take several days to resolve.  6. It is common to experience some constipation if taking pain medication after surgery.  Increasing fluid intake and taking a stool softener will usually help or prevent this problem from occurring.  A mild laxative (Milk of Magnesia or Miralax) should be taken according to package directions if there are no bowel movements after 48 hours. 7. Unless discharge instructions indicate otherwise, you may remove your bandages 48 hours after surgery and you may shower at that time.  You may have steri-strips (small skin tapes) in place directly over the incision.   These strips should be left on the skin for 7-10 days and will come off on their own.  If your surgeon used skin glue on the incision, you may shower in 24 hours.  The glue will flake off over the next 2-3 weeks.  Any sutures or staples will be removed at the office during your follow-up visit. 8. ACTIVITIES:  You may resume regular daily activities (gradually increasing) beginning the next day.  Wearing a good support bra or sports bra minimizes pain and swelling.  You may have sexual intercourse when it is comfortable. a. You may drive when you no longer are taking prescription pain medication, you can comfortably wear a seatbelt, and you can safely maneuver your car and apply brakes. b. RETURN TO WORK:  ______________________________________________________________________________________ 9. You should see your doctor in the office for a follow-up appointment approximately two weeks after your surgery.  Your doctor's nurse will typically make your follow-up appointment when she calls you with your pathology report.  Expect your pathology report 3-4 business days after your surgery.  You may call to check if you do not hear from us after three days. 10. OTHER INSTRUCTIONS: _______________________________________________________________________________________________ _____________________________________________________________________________________________________________________________________ _____________________________________________________________________________________________________________________________________ _____________________________________________________________________________________________________________________________________  WHEN TO CALL DR WAKEFIELD: 1. Fever over 101.0 2. Nausea and/or vomiting. 3. Extreme swelling or bruising. 4. Continued bleeding from incision. 5. Increased pain, redness, or drainage from the incision.  The clinic staff is available to  answer your questions during regular business hours.  Please don't hesitate to call and ask to speak to one of the nurses for   clinical concerns.  If you have a medical emergency, go to the nearest emergency room or call 911.  A surgeon from [facility] is always on call at the hospital.  For further questions, please visit [email or web address]   [facility]  [address] [place], [place] [address] [phone]   Post Anesthesia Home Care Instructions  Activity: Get plenty of rest for the remainder of the day. A responsible adult should stay with you for 24 hours following the procedure.  For the next 24 hours, DO NOT: -Drive a car -Operate machinery -Drink alcoholic beverages -Take any medication unless instructed by your physician -Make any legal decisions or sign important papers.  Meals: Start with liquid foods such as gelatin or soup. Progress to regular foods as tolerated. Avoid greasy, spicy, heavy foods. If nausea and/or vomiting occur, drink only clear liquids until the nausea and/or vomiting subsides. Call your physician if vomiting continues.  Special Instructions/Symptoms: Your throat may feel dry or sore from the anesthesia or the breathing tube placed in your throat during surgery. If this causes discomfort, gargle with warm salt water. The discomfort should disappear within 24 hours.   

## 2011-08-14 NOTE — Anesthesia Preprocedure Evaluation (Signed)
Anesthesia Evaluation  Patient identified by MRN, date of birth, ID band Patient awake    Reviewed: Allergy & Precautions, H&P , NPO status , Patient's Chart, lab work & pertinent test results, reviewed documented beta blocker date and time   Airway Mallampati: II TM Distance: >3 FB Neck ROM: full    Dental   Pulmonary neg pulmonary ROS,          Cardiovascular neg cardio ROS     Neuro/Psych Negative Neurological ROS  Negative Psych ROS   GI/Hepatic negative GI ROS, Neg liver ROS,   Endo/Other  Negative Endocrine ROS  Renal/GU negative Renal ROS  Genitourinary negative   Musculoskeletal   Abdominal   Peds  Hematology negative hematology ROS (+)   Anesthesia Other Findings See surgeon's H&P   Reproductive/Obstetrics negative OB ROS                           Anesthesia Physical Anesthesia Plan  ASA: II  Anesthesia Plan: General   Post-op Pain Management:    Induction: Intravenous  Airway Management Planned: LMA  Additional Equipment:   Intra-op Plan:   Post-operative Plan: Extubation in OR  Informed Consent: I have reviewed the patients History and Physical, chart, labs and discussed the procedure including the risks, benefits and alternatives for the proposed anesthesia with the patient or authorized representative who has indicated his/her understanding and acceptance.     Plan Discussed with: CRNA and Surgeon  Anesthesia Plan Comments:         Anesthesia Quick Evaluation  

## 2011-08-14 NOTE — H&P (View-Only) (Signed)
Subjective:     Patient ID: Emily Knight, female   DOB: 03/25/1956, 55 y.o.   MRN: [id number]  HPI 55 yof with newly diagnosed left breast cancer.  She has now undergone MRI which shows a little bit larger area on the left side but no other abnormality.  Her 2 cannot be performed, er and pr are weakly positive at 30 and 10%. Ki67 is 30.  We discussed her MRI results today at length.     Review of Systems BILATERAL BREAST MRI WITH AND WITHOUT CONTRAST  Technique: Multiplanar, multisequence MR images of both breasts  were obtained prior to and following the intravenous administration  of 17ml of multihance. Three dimensional images were evaluated at  the independent DynaCad workstation.  Comparison: Mammograms from [facility] dated 07/24/2011,  07/23/2011 and 06/30/2010  Findings: There is a moderate background parenchymal enhancement  pattern.  In the lateral aspect of the left breast there is an irregular,  enhancing mass with satellite nodularity measuring 2.2 x 1.4 x 2.5  cm in anterior-posterior, transverse and longitudinal dimensions.  It is associated with a biopsy clip artifact.  There is no enlarged axillary or internal mammary adenopathy.  No abnormal enhancement is seen in the right breast.  IMPRESSION:  Solitary enhancing mass with adjacent satellite nodularity in the  lateral aspect of the left breast corresponding well with the known  malignancy. Treatment planning is recommended.  THREE-DIMENSIONAL MR IMAGE RENDERING ON INDEPENDENT WORKSTATION:  Three-dimensional MR images were rendered by post-processing of the  original MR data on an independent workstation. The three-  dimensional MR images were interpreted, and findings were reported  in the accompanying complete MRI report for this study.  BI-RADS CATEGORY 6: Known biopsy-proven malignancy - appropriate  action should be taken.     Objective:   Physical Exam     Assessment:     Left breast  cancer    Plan:     We discussed MRI findings.  I still think wire guide lumpectomy and sentinel node biopsy are reasonable.  We again discussed possibility of returning for positive margins as well as all treatments being possibilities after surgery.      

## 2011-08-14 NOTE — Op Note (Signed)
Preoperative diagnosis: Clinical stage I left breast cancer Postoperative diagnosis: Same as above Procedure: #1 left breast wire-guided lumpectomy #2 left axillary sentinel lymph node biopsy #3 injection of blue dye for sentinel node identification  Surgeon: Dr. Harden Mo Anesthesia: Gen. Estimated blood loss: 50 cc Specimens: #1 left breast tissue marked with paint kit #2 additional deep margin marked short stitch superior, long stitch lateral, double stitch deep #3 left axillary sentinel node #1 with count of 1609 #4 left axillary sentinel node #2 with count of 485 #5 left axillary non-sentinel node tissue  Complications: None Drains: None Disposition of patient to recovery room in stable condition Sponge and needle count correct x2 at operation  Indications: This is a 57 year old female with a left breast mammographic abnormality. This was followed up with ultrasound and a core biopsy showing invasive lobular carcinoma. She also underwent an MRI which showed around a 2 cm lesion with no other abnormality noted. All of her other information puts this as a stage I cancer which is why it is that way in the op report.I discussed all of her options with her. We have decided on breast conservation therapy with sentinel lymph node biopsy. We discussed the risks and benefits of that procedure prior to beginning.  Procedure: After informed consent was obtained the patient was first taken at the breast center where she had a wire placed. She was then brought to day surgery. She was then injected with technetium in a standard periareolar fashion. She was administered 1 g of intravenous cefazolin. Sequential compression devices were placed on her legs prior to induction. She was then placed under general anesthesia without complication. A surgical timeout was then performed. I then cleansed the area around her areola. I injected a cc of a methylene blue saline mixture and each of the 4 positions  around the nipple areolar complex. This was then massaged. She was then prepped and draped in the standard sterile surgical fashion.  I first approached the breast. I had the mammograms with the wire placement available for my review. I made a radial incision in the lower outer quadrant of the left breast. I then used cautery to excise the wire and all of the surrounding tissue. I brought the wire in remotely from my incision. This was removed in its entirety. I then marked this with the patient can't. Faxitron mammogram was taken confirming removal of the wire, clipped, and lesion. I did take some additional deep margin of her deep margin is now her pectoralis muscle.I obtained hemostasis in this and packed this with a sponge. It was confirmed by radiology that I had removed the specimen adequately.  I then located the sentinel node in her axilla. I made about a 2 cm incision below her axillary hairline. I used cautery to carry this through the axillary fascia. I then noted a fair amount of radioactivity. There were some a bundle of small nodes excised and this is the axillary non-sentinel nodes. I then identified 2 nodes. Counts are listed as above. After these were excised there was no background radioactivity present. I packed this with a sponge. I then turned my attention back to the breast.  I then placed 2 clips deep and one clip in each caudal position around the cavity. I then closed this in multiple layers of 2-0 Vicryl 3-0 Vicryl and 4 Monocryl. I then went to the axilla again. Hemostasis was observed. I closed the x-ray fashion with 2-0 Vicryl. The dermis was closed with  3-0 Vicryl and the skin with 4 Monocryl. Steri-Strips and sterile dressings were placed on both of these. A breast binder was placed. She was extubated in the operating room and transferred to recovery room in stable condition.

## 2011-08-16 ENCOUNTER — Other Ambulatory Visit (INDEPENDENT_AMBULATORY_CARE_PROVIDER_SITE_OTHER): Payer: Self-pay | Admitting: General Surgery

## 2011-08-16 DIAGNOSIS — Z09 Encounter for follow-up examination after completed treatment for conditions other than malignant neoplasm: Secondary | ICD-10-CM

## 2011-08-21 ENCOUNTER — Encounter: Payer: Self-pay | Admitting: *Deleted

## 2011-08-21 ENCOUNTER — Telehealth: Payer: Self-pay | Admitting: *Deleted

## 2011-08-21 ENCOUNTER — Ambulatory Visit (INDEPENDENT_AMBULATORY_CARE_PROVIDER_SITE_OTHER): Payer: 59 | Admitting: General Surgery

## 2011-08-21 ENCOUNTER — Encounter (INDEPENDENT_AMBULATORY_CARE_PROVIDER_SITE_OTHER): Payer: Self-pay | Admitting: General Surgery

## 2011-08-21 VITALS — BP 138/98 | HR 72 | Temp 97.3°F | Resp 16 | Ht 67.0 in | Wt 185.0 lb

## 2011-08-21 DIAGNOSIS — Z09 Encounter for follow-up examination after completed treatment for conditions other than malignant neoplasm: Secondary | ICD-10-CM

## 2011-08-21 DIAGNOSIS — C50919 Malignant neoplasm of unspecified site of unspecified female breast: Secondary | ICD-10-CM | POA: Insufficient documentation

## 2011-08-21 NOTE — Progress Notes (Signed)
Patient is at Dr. Doreen Salvage office and [name] called [name] for an appt.  Gave [name] Thursday, 08/23/11 at 12:30 to see Dr. Donnie Coffin.  Unable to mail before letter & packet.  Emailed Clydie Braun for [employer].

## 2011-08-21 NOTE — Progress Notes (Signed)
Subjective:     Patient ID: Emily Knight, female   DOB: 05/07/1956, 55 y.o.   MRN: [id number]  HPI 55 yof who underwent left lumpectomy/snbx recently.  She is doing well without any real complaints from the surgery.  She comes in today to discuss her pathology results.  Pathology showed 2.2 cm lobular cancer with negative margins and extensive lcis.  The closest margin was 0.8 cm.  Three nodes were excised.  Two sentinel nodes were positive for tumor and extracapsular extension.  One additional nonsentinel node was negative.  Her tumor is er pos at 20% pr pos at 10% her2neu is not amplified.  Review of Systems     Objective:   Physical Exam Healing left breast and left axillary incisions without infection    Assessment:     Stage IIb left breast cancer    Plan:     We had a lengthy discussion about pathology and options from now on out.  We first discussed lumpectomy.  She asked if proceeding with a mastectomy would have benefit for her cancer and I told her no.  I think continuing with lumpectomy and xrt is still fine esp with negative margin.  We discussed the LCIS component as well.  She is comfortable with the decision still that lumpectomy will give her same survival after she undergoes xrt.   We also discussed her lymph node status at length.  She has two sentinel nodes positive and a nonsentinel node negative.  I told her I do think she will receive both chemotherapy as well as antiestrogen therapy to reduce risk of recurrence long term.  She will also likely need a staging workup.  She is due to see Dr. Donnie Coffin of med onc and Dr. Basilio Cairo of rad onc later this week.  I told her we would then come up with final recommendations.  We discussed completion alnd vs no further axillary surgery.  Technically she meets Z11 criteria although low er positivity, extracapsular extensions and her age are somewhat worrisome to her.  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  I think we will determine need for alnd after she sees both the other physicians.  I will plan on following up with her next week.

## 2011-08-22 ENCOUNTER — Ambulatory Visit
Admission: RE | Admit: 2011-08-22 | Discharge: 2011-08-22 | Disposition: A | Discharge status: Home / Self Care | Payer: 59 | Source: Ambulatory Visit | Attending: Radiation Oncology | Admitting: Radiation Oncology

## 2011-08-22 ENCOUNTER — Encounter: Payer: Self-pay | Admitting: Radiation Oncology

## 2011-08-22 VITALS — BP 146/98 | HR 75 | Temp 98.0°F | Resp 20 | Ht 67.0 in | Wt 187.5 lb

## 2011-08-22 DIAGNOSIS — Z808 Family history of malignant neoplasm of other organs or systems: Secondary | ICD-10-CM | POA: Insufficient documentation

## 2011-08-22 DIAGNOSIS — Z87891 Personal history of nicotine dependence: Secondary | ICD-10-CM | POA: Insufficient documentation

## 2011-08-22 DIAGNOSIS — C50419 Malignant neoplasm of upper-outer quadrant of unspecified female breast: Secondary | ICD-10-CM

## 2011-08-22 DIAGNOSIS — Z809 Family history of malignant neoplasm, unspecified: Secondary | ICD-10-CM | POA: Insufficient documentation

## 2011-08-22 DIAGNOSIS — Z90710 Acquired absence of both cervix and uterus: Secondary | ICD-10-CM | POA: Insufficient documentation

## 2011-08-22 DIAGNOSIS — C50919 Malignant neoplasm of unspecified site of unspecified female breast: Secondary | ICD-10-CM

## 2011-08-22 DIAGNOSIS — C773 Secondary and unspecified malignant neoplasm of axilla and upper limb lymph nodes: Secondary | ICD-10-CM | POA: Insufficient documentation

## 2011-08-22 DIAGNOSIS — R232 Flushing: Secondary | ICD-10-CM | POA: Insufficient documentation

## 2011-08-22 HISTORY — DX: Flushing: R23.2

## 2011-08-22 HISTORY — DX: Anxiety disorder, unspecified: F41.9

## 2011-08-22 NOTE — Progress Notes (Signed)
Please see the Nurse Progress Note in the MD Initial Consult Encounter for this patient. 

## 2011-08-22 NOTE — Progress Notes (Signed)
New Consult Left  Breast Cancer Retired Emergency planning/management officer Allergies: Codeine, nausea adhesive tape=blister  Left breast with steri-strips incision looks good,healing, swollen,tenderness  Single,has significant other Father being treated now lung/brain Cancer

## 2011-08-22 NOTE — Progress Notes (Addendum)
[facility] Radiation Oncology NEW PATIENT EVALUATION  Name: Emily Knight MRN: [id number]  Date: 08/22/2011  DOB: 24-May-1956  Status: outpatient   CC: Mickie Hillier, MD, MD  Emelia Loron, MD Pierce Crane, MD   REFERRING PHYSICIAN: Emelia Loron, MD   DIAGNOSIS: [id number] Stage IIb invasive lobular carcinoma of the upper-outer quadrant of the left breast. ER 20% PR 10% HER-2/neu negative.    HISTORY OF PRESENT ILLNESS:  Emily Knight is a perimenopausal 56 y.o. female who has a history of abnormality in the right breast that has been followed with serial mammograms. However, her most recent screening mammogram showed a concerning abnormality in her left breast which prompted further workup. I do not have access to her prior mammography reports. However I do have access to the pathology report from her initial biopsy of the left breast which was conducted on 07-24-11. The needle core biopsy of the left breast showed invasive mammary carcinoma which is grade 2. This is ER 20% PR 10% and ultimately he was found to be HER-2/neu negative. She underwent an MRI of her breasts on 07-31-11. There was no abnormal enhancement reported in the right breast. However the left breast showed an irregular enhancing mass with satellite nodularity that measured 2.2 x 1.4 x 2.5 cm at the lateral aspect of the breast. There was no enlarged axillary or internal mammary adenopathy. The patient subsequently underwent a lumpectomy and sentinel lymph node biopsy by Dr. Dwain Sarna on 08/14/2011. I reviewed the pathology report which demonstrates invasive grade 2 lobular carcinoma - 2.2 cm in greatest dimension. The margins are negative by 0.8 cm. There is extensive LCIS. 2 sentinel lymph nodes were removed and both of these were positive with extracapsular extension. A third left axillary lymph node which is felt to be clinically suspicious during surgery was removed but this was actually negative by  pathology.  The patient has met again with her surgeon, Dr. Dwain Sarna, and he has talked to her about the ACOSOG Z-0011 acknowledging that she would've been a candidate for that trial and it would not be outside of the standard of care to do defer any further axillary surgery. However some aspects of her case give him pause, including the fact that she is relatively young and otherwise healthy and could live for quite a long time; furthermore she has invasive lobular carcinoma and there is some concern as to how sensitive this would be the chemotherapy. Furthermore she has extracapsular extension in both of the sentinel lymph nodes.  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 Lymphedema would be potentially very difficult for her lifestyle.   PREVIOUS RADIATION THERAPY: No   PAST MEDICAL HISTORY:  has a past medical history of Allergy; Carotid artery aneurysm; Arthritis; Stress fracture; Hot flushes, perimenopausal; Cerebral aneurysm; Cancer; Breast cancer (08/14/11 [facility]); Hot flashes; and Anxiety.     PAST SURGICAL HISTORY:  Past Surgical History  Procedure Date  . Left knee reconstruction     six surgeries (3 orthoscopic) 3 open, ACL  . Tonsillectomy and adenoidectomy 1971  . Partial hysterectomy 1984? 1989?     for fibroids   . Trigger finger release 06/29/2008    release A-1 pulley and exc. cyst left thumb  . Breast lumpectomy 08/14/11    Left breast lumpectomy, left axillary sentinel node biopsy  . Abdominal hysterectomy 1980's    ovaries intact     FAMILY  HISTORY: family history includes Anesthesia problems in her mother; Cancer in her father, maternal aunt, paternal aunt, and paternal uncle; and Melanoma in her cousin.   SOCIAL HISTORY:  reports that she has quit smoking. Her smoking use included Cigarettes. She quit after 10 years of use. She has never used smokeless tobacco. She reports that she drinks alcohol.  She reports that she does not use illicit drugs.   ALLERGIES: Adhesive and Codeine   MEDICATIONS:  Current Outpatient Prescriptions  Medication Sig Dispense Refill  . fish oil-omega-3 fatty acids 1000 MG capsule Take 2 g by mouth daily.      Marland Kitchen oxyCODONE-acetaminophen (ROXICET) 5-325 MG per tablet Take 1-2 tablets by mouth every 6 (six) hours as needed for pain.  30 tablet  0     REVIEW OF SYSTEMS:  A comprehensive review of systems was negative.    PHYSICAL EXAM:  height is 5\' 7"  (1.702 m) and weight is 187 lb 8 oz (85.049 kg). Her oral temperature is 98 F (36.7 C). Her blood pressure is 146/98 and her pulse is 75. Her respiration is 20.   General: Alert and oriented, in no acute distress. She is well-appearing. HEENT: Head is normocephalic. Pupils are equally round and reactive to light. Extraocular movements are intact. Oropharynx is clear. Neck: Neck is supple, no palpable cervical or supraclavicular lymphadenopathy. Heart: Regular in rate and rhythm with no murmurs, rubs, or gallops. Chest: Clear to auscultation bilaterally, with no rhonchi, wheezes, or rales. Abdomen: Soft, nontender, nondistended, with no rigidity or guarding. Extremities: No cyanosis or edema. Lymphatics: No concerning lymphadenopathy. Skin: No concerning lesions. Musculoskeletal: symmetric strength and muscle tone throughout. Neurologic: Cranial nerves II through XII are grossly intact. No obvious focalities. Speech is fluent. Coordination is intact. Psychiatric: Judgment and insight are intact. Affect is appropriate. Breast exam: The right breast demonstrates no palpable lesions. The left breast is notable for a healing lumpectomy scar. She also has a healing axillary scar. There are no palpable axillary nodes on either side.    LABORATORY DATA:  Lab Results  Component Value Date   WBC 7.8 08/09/2011   HGB 15.9* 08/14/2011   HCT 39.5 08/09/2011   MCV 87.4 08/09/2011   PLT 234 08/09/2011   CMP       Component Value Date/Time   NA 139 08/09/2011 1600   K 3.8 08/09/2011 1600   CL 104 08/09/2011 1600   CO2 25 08/09/2011 1600   GLUCOSE 98 08/09/2011 1600   BUN 17 08/09/2011 1600   CREATININE 0.73 08/09/2011 1600   CALCIUM 10.1 08/09/2011 1600   PROT 7.3 08/09/2011 1600   ALBUMIN 3.9 08/09/2011 1600   AST 29 08/09/2011 1600   ALT 54* 08/09/2011 1600   ALKPHOS 55 08/09/2011 1600   BILITOT 0.3 08/09/2011 1600   GFRNONAA >90 08/09/2011 1600   GFRAA >90 08/09/2011 1600     PATHOLOGY: As above   RADIOLOGY: As above    IMPRESSION/PLAN: This is a delightful 56 year old woman with stage IIB invasive lobular carcinoma of the left breast. Technically she would be a candidate for the ACOSOG Z-0011 trial. That being said there are some concerning factors about her case that give her surgeon pause to consider with whether axillary dissection should still be pursued.  Today, I tried to educate the patient, to help her make what I believe will ultimately be a personal decision based on her value structure and and what approach will give her the best peace of mind. I  explained that if she undergoes an axillary lymph node dissection, I would still recommend radiotherapy to the supraclavicular fossa. If she does not undergo an axillary lymph node dissection, I would provide some extra radiotherapy to the axillary region as well so that the axillary region is comprehensively covered. I explained that there is no medical data to prove a survival benefit or improved disease specific survival from axillary lymph node dissection. I did acknowledge however that the data from the ACOSOG  trial has not matured; I suspect that if there is any benefit to axillary lymph node dissection for patients with her type of disease, the statistical benefit is probably going to be modest in the future, if at all.  I explained that with axillary lymph node dissection and radiotherapy her risk of significant lymphedema would be  approximately 25-30%. If she decides against any further surgery but gets radiation alone, the risk of significant lymphedema would be approximately 10%, 15% at most.  I think either approach is appropriate and I would support either decision made by the patient.   The patient asked many thoughtful questions today which I answered to the best of my ability. She is still going to meet with Dr. Donnie Coffin of medical oncology and a couple of days and will ask him further about her concerns regarding the sensitivity of lobular breast cancers to chemotherapy. I think this will factor into her decision.   I spent 60 minutes minutes face to face with the patient and more than 50% of that time was spent in counseling and/or coordination of care.

## 2011-08-23 ENCOUNTER — Ambulatory Visit: Payer: 59

## 2011-08-23 ENCOUNTER — Other Ambulatory Visit (INDEPENDENT_AMBULATORY_CARE_PROVIDER_SITE_OTHER): Payer: Self-pay | Admitting: General Surgery

## 2011-08-23 ENCOUNTER — Other Ambulatory Visit: Payer: 59 | Admitting: Lab

## 2011-08-23 ENCOUNTER — Other Ambulatory Visit (HOSPITAL_BASED_OUTPATIENT_CLINIC_OR_DEPARTMENT_OTHER): Payer: 59 | Admitting: Lab

## 2011-08-23 ENCOUNTER — Other Ambulatory Visit: Payer: Self-pay | Admitting: *Deleted

## 2011-08-23 ENCOUNTER — Ambulatory Visit (HOSPITAL_BASED_OUTPATIENT_CLINIC_OR_DEPARTMENT_OTHER): Payer: 59 | Admitting: Oncology

## 2011-08-23 ENCOUNTER — Ambulatory Visit: Payer: 59 | Admitting: Oncology

## 2011-08-23 VITALS — BP 166/98 | HR 67 | Temp 98.0°F | Ht 67.0 in | Wt 185.8 lb

## 2011-08-23 DIAGNOSIS — C50919 Malignant neoplasm of unspecified site of unspecified female breast: Secondary | ICD-10-CM

## 2011-08-23 DIAGNOSIS — C50419 Malignant neoplasm of upper-outer quadrant of unspecified female breast: Secondary | ICD-10-CM

## 2011-08-23 LAB — COMPREHENSIVE METABOLIC PANEL
ALT: 35 U/L (ref 0–35)
CO2: 29 mEq/L (ref 19–32)
Calcium: 10.4 mg/dL (ref 8.4–10.5)
Chloride: 101 mEq/L (ref 96–112)
Sodium: 138 mEq/L (ref 135–145)
Total Protein: 7.7 g/dL (ref 6.0–8.3)

## 2011-08-23 LAB — CANCER ANTIGEN 27.29: CA 27.29: 29 U/mL (ref 0–39)

## 2011-08-23 LAB — CBC WITH DIFFERENTIAL/PLATELET
BASO%: 0.4 % (ref 0.0–2.0)
MCHC: 34.4 g/dL (ref 31.5–36.0)
MONO#: 0.5 10*3/uL (ref 0.1–0.9)
RBC: 4.81 10*6/uL (ref 3.70–5.45)
WBC: 11 10*3/uL — ABNORMAL HIGH (ref 3.9–10.3)
lymph#: 2.3 10*3/uL (ref 0.9–3.3)

## 2011-08-23 NOTE — Progress Notes (Signed)
Encounter addended by: Delynn Flavin, RN on: 08/23/2011  6:20 PM<BR>     Documentation filed: Charges VN

## 2011-08-24 ENCOUNTER — Telehealth: Payer: Self-pay | Admitting: *Deleted

## 2011-08-24 ENCOUNTER — Encounter (HOSPITAL_BASED_OUTPATIENT_CLINIC_OR_DEPARTMENT_OTHER): Payer: Self-pay | Admitting: *Deleted

## 2011-08-24 NOTE — Telephone Encounter (Signed)
Called patient to inform the patient of the new date and time of the pet scan on 09-04-2011 arrival time 1:15pm nothing to eat or drink six hours prior to 1:30 patient confirmed over the phone on 08-24-2011

## 2011-08-27 ENCOUNTER — Telehealth (INDEPENDENT_AMBULATORY_CARE_PROVIDER_SITE_OTHER): Payer: Self-pay

## 2011-08-27 NOTE — Telephone Encounter (Signed)
Called pt back to go over sx date info. The pt understands.

## 2011-08-27 NOTE — Progress Notes (Signed)
Referral MD  Reason for Referral: Breast Cancer   No chief complaint on file. : Abnormal mammogram  HPI: 56 yo woman from [place] who presents with an abnormal mammogram. U/S of the breast revealed a 1.5 cm in the lt breast at the 230 position. A biopsy of the abnormality revealed a Grade 2 , er/pr+ ( 20/10%) and ki67 30%,  her2 negative,  lobular type cancer. Bilateral MRI scans revealed a 2.5 cm abnormality in the lt breast, no other abnormalities were seen.The patient underwent lumpectomy and sentinel node biopsy on 08/14/11, measuring 2.3 cm with clear surgical margins. 3 lymph nodes were removed , 2 of which had lobular cancer with extracapsular extension.  Past Medical History  Diagnosis Date  . Allergy   . 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 Arthritis     left knee  . Stress fracture     left foot/ankle  . Hot flushes, perimenopausal   . Cerebral aneurysm     dx by cerebral angiogram,encased in bone  . Cancer     left breast  . Breast cancer 08/14/11 [facility]    Left breast lumpectomy/left axillary lymph node resection,invasive Grade II Lobular CA in Situ,,left lymph node bx==positive for metastatic lobular ca(2/2)  . Hot flashes   . Anxiety   :  Past Surgical History  Procedure Date  . Left knee reconstruction     six surgeries (3 orthoscopic) 3 open, ACL  . Tonsillectomy and adenoidectomy 1971  . Partial hysterectomy 1984? 1989?     for fibroids   . Trigger finger release 06/29/2008    release A-1 pulley and exc. cyst left thumb  . Breast lumpectomy 08/14/11    Left breast lumpectomy, left axillary sentinel node biopsy  . Abdominal hysterectomy 1980's    ovaries intact  :  No current outpatient prescriptions on file.:    :  Allergies  Allergen Reactions  . Adhesive (Tape) Other (See Comments)    BLISTERS  . Codeine Nausea And Vomiting  :  Family History  Problem Relation Age of Onset  . Anesthesia problems Mother      post-op N/V  . Cancer Father     lung  . Cancer Maternal Aunt     ovarian  . Cancer Paternal Aunt     lung  . Cancer Paternal Uncle     unaware  . Melanoma Cousin     female ,skin  :  History   Social History  . Marital Status: Single, has SO    Spouse Name: N/A    Number of Children: N/A  . Years of Education: N/A   Occupational History  . 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 Smoking status: Former Smoker -- 10 years    Types: Cigarettes  . Smokeless tobacco: Never Used   Comment: quit smoking 10 yrs. ago  . Alcohol Use: Yes     occasionally  . Drug Use: No     quit smoking 2002, smoked for 10 quit restrated for 5 more  . Sexually Active: Not on file     hrt 1 year     Other Topics Concern  . Not on file   Social History Narrative  . 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  Exam:  IPVITAL General appearance: alert, cooperative and appears stated age Eyes: conjunctivae/corneas clear. PERRL, EOM's intact. Fundi benign. Throat: lips, mucosa, and tongue normal; teeth and gums normal Resp: clear to auscultation bilaterally and normal percussion bilaterally Breasts: normal appearance, no masses or tenderness Cardio: regular rate and rhythm, S1, S2 normal, no murmur, click, rub or gallop and normal apical impulse GI: soft, non-tender; bowel sounds normal; no masses,  no organomegaly    Blood smear review: n/a  Pathology:as above  Dg Eye Foreign Body  07/31/2011  *RADIOLOGY REPORT*  Clinical Data: 57 year old female with history of metal exposure to the orbits.  Planned MRI.  ORBITS FOR FOREIGN BODY - 2 VIEW  Comparison: None.  Findings: There is no evidence of metallic foreign body within the orbits.  No significant bone abnormality identified. .  IMPRESSION:  No evidence of metallic foreign body  within the orbits.  Original Report Authenticated By: Harley Hallmark, M.D.   Chest 2 View  08/09/2011  *RADIOLOGY REPORT*  Clinical Data: Preop for breast surgery  CHEST - 2 VIEW  Comparison: Portable chest x-ray of 08/10/2010  Findings:  The lungs are clear.  Mediastinal contours appear normal.  The heart is within normal limits in size.  No bony abnormality is seen.  IMPRESSION: No active lung disease.  Original Report Authenticated By: Juline Patch, M.D.   Mr Breast Bilateral W Wo Contrast  08/01/2011  *RADIOLOGY REPORT*  Clinical Data: Newly diagnosed invasive mammary carcinoma of the left breast  BILATERAL BREAST MRI WITH AND WITHOUT CONTRAST  Technique: Multiplanar, multisequence MR images of both breasts were obtained prior to and following the intravenous administration of 17ml of multihance.  Three dimensional images were evaluated at the independent DynaCad workstation.  Comparison:  Mammograms from [facility] dated 07/24/2011, 07/23/2011 and 06/30/2010  Findings: There is a moderate background parenchymal enhancement pattern.  In the lateral aspect of the left breast there is an irregular, enhancing mass with satellite nodularity measuring 2.2 x 1.4 x 2.5 cm in anterior-posterior, transverse and longitudinal dimensions. It is associated with a biopsy clip artifact.  There is no enlarged axillary or internal mammary adenopathy.  No abnormal enhancement is seen in the right breast.  IMPRESSION: Solitary enhancing mass with adjacent satellite nodularity in the lateral aspect of the left breast corresponding well with the known malignancy.  Treatment planning is recommended.  THREE-DIMENSIONAL MR IMAGE RENDERING ON INDEPENDENT WORKSTATION:  Three-dimensional MR images were rendered by post-processing of the original MR data on an independent workstation.  The three- dimensional MR images were interpreted, and findings were reported in the accompanying complete MRI report for this study.   BI-RADS CATEGORY 6:  Known biopsy-proven malignancy - appropriate action should be taken.  Original Report Authenticated By: Littie Deeds. Judyann Munson, M.D.   Nm Sentinel Node Inj-no Rpt (breast)  08/14/2011  CLINICAL DATA: preop   Sulfur colloid was injected intradermally by the nuclear medicine  technologist for breast cancer sentinel node localization.      Assessment and Plan:  56 yo woman with hx of node positive lobular cancer . We spent 45 minutes discussing her current situation with respect to possibility of full node dissection. Given the number of nodes involved, the low er/pr positivity and increased ki67, I would recommend a full node dissection. Following this I would recommend adjuvant chemotherapy and possible enrollment in the B49 protocol. The patient has been seen  By Radiation Oncology. I will go ahead with a staging PET scan, 2d  echo, chemo teaching  and port placement/ I will see her back in 2 weeks.   Mervin Hack MD [facility]

## 2011-08-28 ENCOUNTER — Encounter (HOSPITAL_BASED_OUTPATIENT_CLINIC_OR_DEPARTMENT_OTHER): Payer: Self-pay | Admitting: Anesthesiology

## 2011-08-28 ENCOUNTER — Encounter (HOSPITAL_BASED_OUTPATIENT_CLINIC_OR_DEPARTMENT_OTHER)
Admission: RE | Disposition: A | Discharge status: Home / Self Care | Payer: Self-pay | Source: Ambulatory Visit | Attending: General Surgery

## 2011-08-28 ENCOUNTER — Ambulatory Visit (HOSPITAL_BASED_OUTPATIENT_CLINIC_OR_DEPARTMENT_OTHER)
Admission: RE | Admit: 2011-08-28 | Discharge: 2011-08-29 | Disposition: A | Discharge status: Home / Self Care | Payer: 59 | Source: Ambulatory Visit | Attending: General Surgery | Admitting: General Surgery

## 2011-08-28 ENCOUNTER — Ambulatory Visit (HOSPITAL_COMMUNITY): Payer: 59

## 2011-08-28 ENCOUNTER — Ambulatory Visit (HOSPITAL_BASED_OUTPATIENT_CLINIC_OR_DEPARTMENT_OTHER): Payer: 59 | Admitting: Anesthesiology

## 2011-08-28 ENCOUNTER — Encounter (HOSPITAL_BASED_OUTPATIENT_CLINIC_OR_DEPARTMENT_OTHER): Payer: Self-pay | Admitting: *Deleted

## 2011-08-28 DIAGNOSIS — C50919 Malignant neoplasm of unspecified site of unspecified female breast: Secondary | ICD-10-CM

## 2011-08-28 HISTORY — PX: PORTACATH PLACEMENT: SHX2246

## 2011-08-28 HISTORY — PX: AXILLARY LYMPH NODE DISSECTION: SHX5229

## 2011-08-28 LAB — POCT HEMOGLOBIN-HEMACUE: Hemoglobin: 14.8 g/dL (ref 12.0–15.0)

## 2011-08-28 SURGERY — LYMPHADENECTOMY, AXILLARY
Anesthesia: General | Site: Chest | Laterality: Right | Wound class: Clean

## 2011-08-28 MED ORDER — DROPERIDOL 2.5 MG/ML IJ SOLN
INTRAMUSCULAR | Status: DC | PRN
  Administered 2011-08-28: 0.625 mg via INTRAVENOUS

## 2011-08-28 MED ORDER — HEPARIN SOD (PORK) LOCK FLUSH 100 UNIT/ML IV SOLN
INTRAVENOUS | Status: DC | PRN
  Administered 2011-08-28: 500 [IU] via INTRAVENOUS

## 2011-08-28 MED ORDER — MIDAZOLAM HCL 5 MG/5ML IJ SOLN
INTRAMUSCULAR | Status: DC | PRN
  Administered 2011-08-28: 2 mg via INTRAVENOUS

## 2011-08-28 MED ORDER — ONDANSETRON HCL 4 MG/2ML IJ SOLN
4.0000 mg | Freq: Four times a day (QID) | INTRAMUSCULAR | Status: DC | PRN
  Administered 2011-08-28: 4 mg via INTRAVENOUS

## 2011-08-28 MED ORDER — LIDOCAINE HCL (CARDIAC) 20 MG/ML IV SOLN
INTRAVENOUS | Status: DC | PRN
  Administered 2011-08-28: 60 mg via INTRAVENOUS

## 2011-08-28 MED ORDER — ACETAMINOPHEN 325 MG PO TABS
650.0000 mg | ORAL_TABLET | Freq: Four times a day (QID) | ORAL | Status: DC | PRN
  Administered 2011-08-28 – 2011-08-29 (×2): 650 mg via ORAL

## 2011-08-28 MED ORDER — FENTANYL CITRATE 0.05 MG/ML IJ SOLN
25.0000 ug | INTRAMUSCULAR | Status: DC | PRN
  Administered 2011-08-28: 50 ug via INTRAVENOUS
  Administered 2011-08-28 (×2): 25 ug via INTRAVENOUS

## 2011-08-28 MED ORDER — BUPIVACAINE HCL (PF) 0.25 % IJ SOLN
INTRAMUSCULAR | Status: DC | PRN
  Administered 2011-08-28: 10 mL
  Administered 2011-08-28: 5 mL

## 2011-08-28 MED ORDER — METOCLOPRAMIDE HCL 5 MG/ML IJ SOLN: 10.0000 mg | Freq: Once | INTRAMUSCULAR | Status: AC | PRN

## 2011-08-28 MED ORDER — CEFAZOLIN SODIUM-DEXTROSE 2-3 GM-% IV SOLR
2.0000 g | INTRAVENOUS | Status: AC
  Administered 2011-08-28: 2 g via INTRAVENOUS

## 2011-08-28 MED ORDER — BACITRACIN ZINC 500 UNIT/GM EX OINT
TOPICAL_OINTMENT | CUTANEOUS | Status: DC | PRN
  Administered 2011-08-28: 1 via TOPICAL

## 2011-08-28 MED ORDER — CEFAZOLIN SODIUM-DEXTROSE 2-3 GM-% IV SOLR: 2.0000 g | INTRAVENOUS | Status: DC

## 2011-08-28 MED ORDER — SODIUM CHLORIDE 0.9 % IV SOLN: INTRAVENOUS | Status: DC

## 2011-08-28 MED ORDER — FENTANYL CITRATE 0.05 MG/ML IJ SOLN
INTRAMUSCULAR | Status: DC | PRN
  Administered 2011-08-28: 25 ug via INTRAVENOUS
  Administered 2011-08-28: 50 ug via INTRAVENOUS
  Administered 2011-08-28: 100 ug via INTRAVENOUS
  Administered 2011-08-28 (×4): 25 ug via INTRAVENOUS

## 2011-08-28 MED ORDER — PROPOFOL 10 MG/ML IV EMUL
INTRAVENOUS | Status: DC | PRN
  Administered 2011-08-28: 200 mg via INTRAVENOUS

## 2011-08-28 MED ORDER — ONDANSETRON HCL 4 MG/2ML IJ SOLN
INTRAMUSCULAR | Status: DC | PRN
  Administered 2011-08-28 (×2): 4 mg via INTRAVENOUS

## 2011-08-28 MED ORDER — EPHEDRINE SULFATE 50 MG/ML IJ SOLN
INTRAMUSCULAR | Status: DC | PRN
  Administered 2011-08-28 (×2): 10 mg via INTRAVENOUS

## 2011-08-28 MED ORDER — MORPHINE SULFATE 2 MG/ML IJ SOLN: 2.0000 mg | INTRAMUSCULAR | Status: DC | PRN

## 2011-08-28 MED ORDER — MIDAZOLAM HCL 2 MG/2ML IJ SOLN: 0.5000 mg | INTRAMUSCULAR | Status: DC | PRN

## 2011-08-28 MED ORDER — HEPARIN (PORCINE) IN NACL 2-0.9 UNIT/ML-% IJ SOLN
INTRAMUSCULAR | Status: DC | PRN
  Administered 2011-08-28: 1 via INTRAVENOUS

## 2011-08-28 MED ORDER — ACETAMINOPHEN 650 MG RE SUPP: 650.0000 mg | Freq: Four times a day (QID) | RECTAL | Status: DC | PRN

## 2011-08-28 MED ORDER — ACETAMINOPHEN 10 MG/ML IV SOLN
1000.0000 mg | Freq: Once | INTRAVENOUS | Status: AC
  Administered 2011-08-28: 1000 mg via INTRAVENOUS

## 2011-08-28 MED ORDER — LACTATED RINGERS IV SOLN
INTRAVENOUS | Status: DC
  Administered 2011-08-28 (×3): via INTRAVENOUS

## 2011-08-28 MED ORDER — DEXAMETHASONE SODIUM PHOSPHATE 4 MG/ML IJ SOLN
INTRAMUSCULAR | Status: DC | PRN
  Administered 2011-08-28: 10 mg via INTRAVENOUS

## 2011-08-28 MED ORDER — OXYCODONE HCL 5 MG PO TABS
5.0000 mg | ORAL_TABLET | ORAL | Status: DC | PRN
  Administered 2011-08-28 – 2011-08-29 (×4): 10 mg via ORAL

## 2011-08-28 SURGICAL SUPPLY — 72 items
APPLIER CLIP 9.375 MED OPEN (MISCELLANEOUS) ×3
BAG DECANTER FOR FLEXI CONT (MISCELLANEOUS) ×3 IMPLANT
BENZOIN TINCTURE PRP APPL 2/3 (GAUZE/BANDAGES/DRESSINGS) IMPLANT
BINDER BREAST XLRG (GAUZE/BANDAGES/DRESSINGS) ×3 IMPLANT
BLADE SURG 11 STRL SS (BLADE) ×3 IMPLANT
BLADE SURG 15 STRL LF DISP TIS (BLADE) ×2 IMPLANT
BLADE SURG 15 STRL SS (BLADE) ×1
CANISTER SUCTION 1200CC (MISCELLANEOUS) ×3 IMPLANT
CHLORAPREP W/TINT 26ML (MISCELLANEOUS) ×6 IMPLANT
CLIP APPLIE 9.375 MED OPEN (MISCELLANEOUS) ×2 IMPLANT
CLOTH BEACON ORANGE TIMEOUT ST (SAFETY) ×3 IMPLANT
COVER MAYO STAND STRL (DRAPES) ×3 IMPLANT
COVER TABLE BACK 60X90 (DRAPES) ×3 IMPLANT
DECANTER SPIKE VIAL GLASS SM (MISCELLANEOUS) IMPLANT
DERMABOND ADVANCED (GAUZE/BANDAGES/DRESSINGS) ×2
DERMABOND ADVANCED .7 DNX12 (GAUZE/BANDAGES/DRESSINGS) ×4 IMPLANT
DRAIN CHANNEL 19F RND (DRAIN) ×3 IMPLANT
DRAPE C-ARM 42X72 X-RAY (DRAPES) ×3 IMPLANT
DRAPE LAPAROSCOPIC ABDOMINAL (DRAPES) ×3 IMPLANT
DRAPE PED LAPAROTOMY (DRAPES) ×3 IMPLANT
DRAPE U-SHAPE 76X120 STRL (DRAPES) ×3 IMPLANT
DRSG TEGADERM 4X4.75 (GAUZE/BANDAGES/DRESSINGS) ×3 IMPLANT
ELECT COATED BLADE 2.86 ST (ELECTRODE) ×3 IMPLANT
ELECT REM PT RETURN 9FT ADLT (ELECTROSURGICAL) ×3
ELECTRODE REM PT RTRN 9FT ADLT (ELECTROSURGICAL) ×2 IMPLANT
EVACUATOR SILICONE 100CC (DRAIN) ×3 IMPLANT
GAUZE SPONGE 4X4 12PLY STRL LF (GAUZE/BANDAGES/DRESSINGS) ×3 IMPLANT
GLOVE BIO SURGEON STRL SZ 6.5 (GLOVE) ×3 IMPLANT
GLOVE BIO SURGEON STRL SZ7 (GLOVE) ×6 IMPLANT
GLOVE BIOGEL PI IND STRL 7.0 (GLOVE) ×2 IMPLANT
GLOVE BIOGEL PI IND STRL 7.5 (GLOVE) ×4 IMPLANT
GLOVE BIOGEL PI INDICATOR 7.0 (GLOVE) ×1
GLOVE BIOGEL PI INDICATOR 7.5 (GLOVE) ×2
GOWN PREVENTION PLUS XLARGE (GOWN DISPOSABLE) ×9 IMPLANT
HEMOSTAT SURGICEL .5X2 ABSORB (HEMOSTASIS) ×3 IMPLANT
IV HEPARIN 1000UNITS/500ML (IV SOLUTION) IMPLANT
IV KIT MINILOC 20X1 SAFETY (NEEDLE) IMPLANT
KIT POWER CATH 8FR (Catheter) ×3 IMPLANT
NDL SAFETY ECLIPSE 18X1.5 (NEEDLE) IMPLANT
NEEDLE HYPO 18GX1.5 SHARP (NEEDLE)
NEEDLE HYPO 25X1 1.5 SAFETY (NEEDLE) ×3 IMPLANT
NS IRRIG 1000ML POUR BTL (IV SOLUTION) ×3 IMPLANT
PACK BASIN DAY SURGERY FS (CUSTOM PROCEDURE TRAY) ×3 IMPLANT
PENCIL BUTTON HOLSTER BLD 10FT (ELECTRODE) ×3 IMPLANT
PIN SAFETY STERILE (MISCELLANEOUS) ×3 IMPLANT
SHEET MEDIUM DRAPE 40X70 STRL (DRAPES) ×3 IMPLANT
SLEEVE SCD COMPRESS KNEE MED (MISCELLANEOUS) ×3 IMPLANT
SPONGE GAUZE 4X4 12PLY (GAUZE/BANDAGES/DRESSINGS) ×6 IMPLANT
SPONGE LAP 4X18 X RAY DECT (DISPOSABLE) ×6 IMPLANT
STAPLER VISISTAT 35W (STAPLE) ×3 IMPLANT
STOCKINETTE IMPERVIOUS LG (DRAPES) ×3 IMPLANT
STRIP CLOSURE SKIN 1/2X4 (GAUZE/BANDAGES/DRESSINGS) ×3 IMPLANT
SUT ETHILON 2 0 FS 18 (SUTURE) ×3 IMPLANT
SUT MNCRL AB 4-0 PS2 18 (SUTURE) ×6 IMPLANT
SUT MON AB 4-0 PC3 18 (SUTURE) ×3 IMPLANT
SUT PROLENE 2 0 SH DA (SUTURE) ×3 IMPLANT
SUT SILK 2 0 SH (SUTURE) IMPLANT
SUT SILK 2 0 TIES 17X18 (SUTURE)
SUT SILK 2-0 18XBRD TIE BLK (SUTURE) IMPLANT
SUT VIC AB 2-0 SH 27 (SUTURE) ×1
SUT VIC AB 2-0 SH 27XBRD (SUTURE) ×2 IMPLANT
SUT VIC AB 3-0 SH 27 (SUTURE) ×2
SUT VIC AB 3-0 SH 27X BRD (SUTURE) ×4 IMPLANT
SUT VICRYL AB 3 0 TIES (SUTURE) IMPLANT
SYR 5ML LUER SLIP (SYRINGE) ×3 IMPLANT
SYR CONTROL 10ML LL (SYRINGE) ×3 IMPLANT
TAPE CLOTH SURG 4X10 WHT LF (GAUZE/BANDAGES/DRESSINGS) ×3 IMPLANT
TOWEL OR 17X24 6PK STRL BLUE (TOWEL DISPOSABLE) ×6 IMPLANT
TOWEL OR NON WOVEN STRL DISP B (DISPOSABLE) ×3 IMPLANT
TUBE CONNECTING 20X1/4 (TUBING) ×3 IMPLANT
WATER STERILE IRR 1000ML POUR (IV SOLUTION) IMPLANT
YANKAUER SUCT BULB TIP NO VENT (SUCTIONS) ×3 IMPLANT

## 2011-08-28 NOTE — Interval H&P Note (Signed)
History and Physical Interval Note:  08/28/2011 12:15 PM  Emily Knight  has presented today for surgery, with the diagnosis of breast cancer, need for venous access  The various methods of treatment have been discussed with the patient and family. After consideration of risks, benefits and other options for treatment, the patient has consented to  Procedure(s) (LRB): AXILLARY LYMPH NODE DISSECTION (N/A) INSERTION PORT-A-CATH (N/A) as a surgical intervention .  The patients' history has been reviewed, patient examined, no change in status, stable for surgery.  I have reviewed the patients' chart and labs.  Questions were answered to the patient's satisfaction.     Gar Glance

## 2011-08-28 NOTE — Anesthesia Postprocedure Evaluation (Signed)
Anesthesia Post Note  Patient: Emily Knight  Procedure(s) Performed: Procedure(s) (LRB): AXILLARY LYMPH NODE DISSECTION (Left) INSERTION PORT-A-CATH (Right)  Anesthesia type: General  Patient location: PACU  Post pain: Pain level controlled  Post assessment: Patient's Cardiovascular Status Stable  Last Vitals:  Filed Vitals:   08/28/11 1615  BP: 144/86  Pulse: 86  Temp: 36.7 C  Resp: 16    Post vital signs: Reviewed and stable  Level of consciousness: alert  Complications: No apparent anesthesia complications

## 2011-08-28 NOTE — Anesthesia Procedure Notes (Signed)
Procedure Name: LMA Insertion Date/Time: 08/28/2011 12:25 PM Performed by: Caren Macadam Pre-anesthesia Checklist: Patient identified, Emergency Drugs available, Suction available and Patient being monitored Patient Re-evaluated:Patient Re-evaluated prior to inductionOxygen Delivery Method: Circle system utilized Preoxygenation: Pre-oxygenation with 100% oxygen Intubation Type: IV induction Ventilation: Mask ventilation without difficulty LMA: LMA inserted LMA Size: 4.0 Number of attempts: 1 Placement Confirmation: breath sounds checked- equal and bilateral and positive ETCO2 Tube secured with: Tape Dental Injury: Teeth and Oropharynx as per pre-operative assessment

## 2011-08-28 NOTE — Discharge Instructions (Addendum)
Refer to your handout on "About my [name] Bulb" for information on how to care for the bulb. Be sure to empty and record the drainage two to three times a day and take this sheet with you when you go back to Emily Knight. Call his office with any problems or questions about your drain, incisions or medication concerns.   Segmental Mastectomy and Axillary Lymph Node Dissection Care After These discharge instructions provide you with general information on caring for yourself after you leave the hospital. While your treatment has been planned according to the most current medical practices available, unavoidable complications occasionally occur. It is important that you know the warning signs of complications so that you can seek treatment. Please read the instructions outlined below and refer to this sheet in the next few weeks. Your doctor may also give you specific information. If you have any complications or questions after discharge, please call your doctor.  ACTIVITY  Your doctor will tell you when you may resume strenuous activities, driving and sports. After the drain(s) are removed, you may do light housework, but avoid heavy lifting, carrying or pushing (you should not be lifting anything heavier than 5 lbs.).   Take frequent rest periods. You may tire more easily than usual. Always rest and elevate the arm affected by your surgery for a period of time equal to your activity time.   Continue doing the exercises given to you by the physical therapist/occupational therapist even after full range of motion has returned. The amount of time this takes will vary from person to person. Generally, after normal range of motion has returned, some stiffness and soreness may persist for 2-3 months. This is normal and should subside.   Begin sports or strenuous activities in moderation, giving you a chance to rebuild your endurance.   Continue to be cautious of heavy lifting or carrying (not more  than 10 lbs.) with the affected arm. You may return to work as recommended by your doctor.  NUTRITION  You may resume your normal diet.   Make sure you drink plenty of fluids (6-8 glasses per day).   Eat a well-balanced diet.   Daily portions of food from the grains, vegetables, fruits, milk, and meat and beans families are necessary for your health. You may visit [email or web address] for more dietary information.  HYGIENE  You may wash your hair.   If your incision (the cut from surgery) is healed, you may shower or take a bath, unless instructed otherwise by your caregiver.  FEVER If you feel feverish or have shaking chills, take your temperature. If your temperature is 102 F (38.9 C) or above, call your caregiver. The fever may mean there is an infection. If you call early, infection can be treated with antibiotics and hospitalization may be avoided. PAIN CONTROL  Mild discomfort may occur. You may need to take an "over-the-counter" pain medication or a medication prescribed by your doctor.   Call your doctor if you experience increased pain.  INCISION CARE Check your incision daily for increased redness, drainage, swelling or separation of skin edges. Call your doctor if any of the above are noted. DRAIN CARE If you have a drain in place, ask for instructions for "Surgical Drain Care". ARM AND HAND CARE  Since the lymph nodes under your arm have been removed, there may be a greater chance for the arm to swell.   Avoid, if possible, having blood pressures taken, blood drawn or injections given in the affected  arm.   Use hand lotion to soften fingernail cuticles instead of cutting them to avoid cutting yourself.   Use an electric shaver to shave your underarms.   You may use a deodorant after the incision has completely healed.   Be careful not to cut yourself when cooking, sewing or gardening.   Do not weigh your arm straight down with a package or your purse.    Note: Follow the exercises and instructions given to you by the physical therapist/occupational therapist and your treating doctor.   New Prescription Oxycodone 5 mg: Take one or two tablets by mouth every 6 hours as needed for pain.

## 2011-08-28 NOTE — Anesthesia Preprocedure Evaluation (Signed)
Anesthesia Evaluation  Patient identified by MRN, date of birth, ID band Patient awake    Reviewed: Allergy & Precautions, H&P , NPO status , Patient's Chart, lab work & pertinent test results, reviewed documented beta blocker date and time   Airway Mallampati: II TM Distance: >3 FB Neck ROM: full    Dental   Pulmonary neg pulmonary ROS,          Cardiovascular + Peripheral Vascular Disease     Neuro/Psych negative neurological ROS  negative psych ROS   GI/Hepatic negative GI ROS, Neg liver ROS,   Endo/Other  negative endocrine ROS  Renal/GU negative Renal ROS  negative genitourinary   Musculoskeletal   Abdominal   Peds  Hematology negative hematology ROS (+)   Anesthesia Other Findings See surgeon's H&P   Reproductive/Obstetrics negative OB ROS                           Anesthesia Physical Anesthesia Plan  ASA: III  Anesthesia Plan: General   Post-op Pain Management:    Induction:   Airway Management Planned: LMA  Additional Equipment:   Intra-op Plan:   Post-operative Plan: Extubation in OR  Informed Consent: I have reviewed the patients History and Physical, chart, labs and discussed the procedure including the risks, benefits and alternatives for the proposed anesthesia with the patient or authorized representative who has indicated his/her understanding and acceptance.     Plan Discussed with: CRNA and Surgeon  Anesthesia Plan Comments:         Anesthesia Quick Evaluation

## 2011-08-28 NOTE — Transfer of Care (Signed)
Immediate Anesthesia Transfer of Care Note  Patient: Emily Knight  Procedure(s) Performed: Procedure(s) (LRB): AXILLARY LYMPH NODE DISSECTION (Left) INSERTION PORT-A-CATH (Right)  Patient Location: PACU  Anesthesia Type: General  Level of Consciousness: sedated  Airway & Oxygen Therapy: Patient Spontanous Breathing and Patient connected to face mask oxygen  Post-op Assessment: Report given to PACU RN and Post -op Vital signs reviewed and stable  Post vital signs: Reviewed and stable  Complications: No apparent anesthesia complications

## 2011-08-28 NOTE — Op Note (Signed)
Preoperative diagnosis: stage II left lobular breast cancer status post lumpectomy and sentinel node biopsy Postoperative diagnosis: Same as above Procedure: #1 right subclavian MRI compatible power port insertion #2 left axillary node dissection  Surgeon: Dr. Harden Mo Anesthesia: Gen. Specimens: Left axillary tissue Drains: 19 French Blake drain to left axilla Complications: None Estimated blood loss: Minimal Sponge and needle count correct x2 Operation Disposition: To recovery in stable condition  Indications: This is a 56 year old female who underwent a left breast lumpectomy and sentinel node biopsy. Her pathology returned with a margin negative lobular breast cancer. She had 2 of 2 sentinel lymph nodes that were positive. She had an additional node that was negative. She had extracapsular extension as well. She has been seen in the multidisciplinary fashion we all agree that continuing with axillary lymph node dissection as well as a port placement for chemotherapy are indicated. She and I discussed this prior to beginning.  Procedure: After informed consent was obtained the patient was taken to the operating room. We decided to do the port first. She had sequential compression devices placed on her legs and 1 g of intravenous cefazolin was administered. She then underwent general anesthesia. Her arms were tucked and axillary roll placed. Both sides of her chest were prepped and draped in the standard sterile surgical fashion. A surgical timeout was performed.  I accessed her right subclavian vein on the first pass. The wire was placed. This was confirmed to be in position by fluoroscopy. I then made a pocket below this overlying the pectoralis fascia. The line was tunneled between the two. The dilator was then placed along with the peel-away sheath under direct vision. The wire assembly was removed. The line was then passed and the peel-away sheath removed. The line was then pulled  back to be a distal vena cava. This was then cut and attached to the port. This was sutured into position in 3 places with 2-0 Prolene suture. This flushed easily and aspirated blood. This was packed with heparin. Final fluoroscopy showed the line not to be kinked and in good position. I then closed with 3-0 Vicryl for Monocryl and Dermabond. We then broke down this set and turned our attention to the left axilla. Her arm was brought out.  Her left axilla and left chest and arm were prepped and draped in the standard sterile surgical fashion. I then infiltrated 10 cc of quarter percent Marcaine throughout her axilla. I then enlarged her prior incision from the sentinel node. This part of the procedure was very difficult given the amount of scarring she had from her prior sentinel node. I eventually was able to get into her axilla and identified the axillary vein. Both nerves were identified and were functional upon entrance. I then removed the tissue caudad to the vein all the way down to the muscle. This was then passed off the table as a specimen. I then obtained hemostasis there was a small area there was oozing right next to the vein that looked like it was just from the fat. I did place a small piece of Surgicel overlying this. I then placed a 19 [place] Blake drain. I then closes with 2-0 Vicryl, 3-0 Vicryl, and 4-0 Monocryl. Steri-Strips and a dressing were placed on this. The drain was secured with a 2-0 nylon and functional upon completion. She tolerated this well transferred to recovery in stable condition.

## 2011-08-28 NOTE — H&P (View-Only) (Signed)
Subjective:     Patient ID: Emily Knight, female   DOB: 07/25/1955, 55 y.o.   MRN: [id number]  HPI 55 yof who underwent left lumpectomy/snbx recently.  She is doing well without any real complaints from the surgery.  She comes in today to discuss her pathology results.  Pathology showed 2.2 cm lobular cancer with negative margins and extensive lcis.  The closest margin was 0.8 cm.  Three nodes were excised.  Two sentinel nodes were positive for tumor and extracapsular extension.  One additional nonsentinel node was negative.  Her tumor is er pos at 20% pr pos at 10% her2neu is not amplified.  Review of Systems     Objective:   Physical Exam Healing left breast and left axillary incisions without infection    Assessment:     Stage IIb left breast cancer    Plan:     We had a lengthy discussion about pathology and options from now on out.  We first discussed lumpectomy.  She asked if proceeding with a mastectomy would have benefit for her cancer and I told her no.  I think continuing with lumpectomy and xrt is still fine esp with negative margin.  We discussed the LCIS component as well.  She is comfortable with the decision still that lumpectomy will give her same survival after she undergoes xrt.   We also discussed her lymph node status at length.  She has two sentinel nodes positive and a nonsentinel node negative.  I told her I do think she will receive both chemotherapy as well as antiestrogen therapy to reduce risk of recurrence long term.  She will also likely need a staging workup.  She is due to see Dr. Rubin of med onc and Dr. Squire of rad onc later this week.  I told her we would then come up with final recommendations.  We discussed completion alnd vs no further axillary surgery.  Technically she meets Z11 criteria although low er positivity, extracapsular extensions and her age are somewhat worrisome to her.  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  I think we will determine need for alnd after she sees both the other physicians.  I will plan on following up with her next week.      

## 2011-08-29 ENCOUNTER — Encounter (HOSPITAL_BASED_OUTPATIENT_CLINIC_OR_DEPARTMENT_OTHER): Payer: Self-pay | Admitting: General Surgery

## 2011-08-29 NOTE — Progress Notes (Signed)
Doing well, drain output as expected, dc home

## 2011-09-04 ENCOUNTER — Encounter (HOSPITAL_COMMUNITY)
Admission: RE | Admit: 2011-09-04 | Discharge: 2011-09-04 | Disposition: A | Discharge status: Home / Self Care | Payer: 59 | Source: Ambulatory Visit | Attending: Oncology | Admitting: Oncology

## 2011-09-04 ENCOUNTER — Encounter: Payer: Self-pay | Admitting: *Deleted

## 2011-09-04 ENCOUNTER — Other Ambulatory Visit: Payer: Self-pay | Admitting: *Deleted

## 2011-09-04 ENCOUNTER — Telehealth: Payer: Self-pay | Admitting: Oncology

## 2011-09-04 DIAGNOSIS — K4020 Bilateral inguinal hernia, without obstruction or gangrene, not specified as recurrent: Secondary | ICD-10-CM | POA: Insufficient documentation

## 2011-09-04 DIAGNOSIS — Z90710 Acquired absence of both cervix and uterus: Secondary | ICD-10-CM | POA: Insufficient documentation

## 2011-09-04 DIAGNOSIS — C50419 Malignant neoplasm of upper-outer quadrant of unspecified female breast: Secondary | ICD-10-CM

## 2011-09-04 DIAGNOSIS — M899 Disorder of bone, unspecified: Secondary | ICD-10-CM | POA: Insufficient documentation

## 2011-09-04 DIAGNOSIS — M949 Disorder of cartilage, unspecified: Secondary | ICD-10-CM | POA: Insufficient documentation

## 2011-09-04 DIAGNOSIS — I700 Atherosclerosis of aorta: Secondary | ICD-10-CM | POA: Insufficient documentation

## 2011-09-04 DIAGNOSIS — C50919 Malignant neoplasm of unspecified site of unspecified female breast: Secondary | ICD-10-CM

## 2011-09-04 DIAGNOSIS — R911 Solitary pulmonary nodule: Secondary | ICD-10-CM | POA: Insufficient documentation

## 2011-09-04 LAB — GLUCOSE, CAPILLARY: Glucose-Capillary: 95 mg/dL (ref 70–99)

## 2011-09-04 MED ORDER — FLUDEOXYGLUCOSE F - 18 (FDG) INJECTION
18.9000 | Freq: Once | INTRAVENOUS | Status: AC | PRN
  Administered 2011-09-04: 18.9 via INTRAVENOUS

## 2011-09-04 NOTE — Telephone Encounter (Signed)
lmonvm adviisng the pt of her appts on 09/05/2011@9 :00am

## 2011-09-05 ENCOUNTER — Other Ambulatory Visit: Payer: Self-pay | Admitting: Oncology

## 2011-09-05 ENCOUNTER — Other Ambulatory Visit (HOSPITAL_BASED_OUTPATIENT_CLINIC_OR_DEPARTMENT_OTHER): Payer: 59 | Admitting: Lab

## 2011-09-05 ENCOUNTER — Encounter: Payer: Self-pay | Admitting: *Deleted

## 2011-09-05 ENCOUNTER — Ambulatory Visit (HOSPITAL_BASED_OUTPATIENT_CLINIC_OR_DEPARTMENT_OTHER): Payer: 59 | Admitting: Oncology

## 2011-09-05 VITALS — BP 141/92 | HR 86 | Temp 98.9°F | Ht 67.0 in | Wt 189.8 lb

## 2011-09-05 DIAGNOSIS — C50419 Malignant neoplasm of upper-outer quadrant of unspecified female breast: Secondary | ICD-10-CM

## 2011-09-05 DIAGNOSIS — C50919 Malignant neoplasm of unspecified site of unspecified female breast: Secondary | ICD-10-CM

## 2011-09-05 LAB — CBC WITH DIFFERENTIAL/PLATELET
Basophils Absolute: 0 10*3/uL (ref 0.0–0.1)
Eosinophils Absolute: 0.2 10*3/uL (ref 0.0–0.5)
HGB: 14.4 g/dL (ref 11.6–15.9)
MCV: 91.7 fL (ref 79.5–101.0)
NEUT#: 5.9 10*3/uL (ref 1.5–6.5)
RDW: 12.2 % (ref 11.2–14.5)
lymph#: 1.9 10*3/uL (ref 0.9–3.3)

## 2011-09-05 LAB — COMPREHENSIVE METABOLIC PANEL
Albumin: 4.1 g/dL (ref 3.5–5.2)
BUN: 11 mg/dL (ref 6–23)
CO2: 19 mEq/L (ref 19–32)
Calcium: 9.3 mg/dL (ref 8.4–10.5)
Chloride: 106 mEq/L (ref 96–112)
Glucose, Bld: 118 mg/dL — ABNORMAL HIGH (ref 70–99)
Potassium: 4.1 mEq/L (ref 3.5–5.3)

## 2011-09-05 LAB — CANCER ANTIGEN 27.29: CA 27.29: 27 U/mL (ref 0–39)

## 2011-09-05 NOTE — Progress Notes (Signed)
Referral MD  Reason for Referral: Breast Cancer   No chief complaint on file. : Abnormal mammogram  HPI: 56 yo woman from [place] who presents with an abnormal mammogram. U/S of the breast revealed a 1.5 cm in the lt breast at the 230 position. A biopsy of the abnormality revealed a Grade 2 , er/pr+ ( 20/10%) and ki67 30%,  her2 negative,  lobular type cancer. Bilateral MRI scans revealed a 2.5 cm abnormality in the lt breast, no other abnormalities were seen.The patient underwent lumpectomy and sentinel node biopsy on 08/14/11, measuring 2.3 cm with clear surgical margins. 3 lymph nodes were removed , 2 of which had lobular cancer with extracapsular extension.she was recently lymph node dissection approximately one week ago for additional lymph nodes were removed all of which are negative for malignancy. She has a drain in place and is recovering well from surgery. She had a staging PET scan which was essentially negative. She had a good in approximately year ago   Past Medical History  Diagnosis Date  . Allergy   . 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 Arthritis     left knee  . Stress fracture     left foot/ankle  . Hot flushes, perimenopausal   . Cerebral aneurysm     dx by cerebral angiogram,encased in bone  . Cancer     left breast  . Breast cancer 08/14/11 [facility]    Left breast lumpectomy/left axillary lymph node resection,invasive Grade II Lobular CA in Situ,,left lymph node bx==positive for metastatic lobular ca(2/2)  . Hot flashes   . Anxiety   :  Past Surgical History  Procedure Date  . Left knee reconstruction     six surgeries (3 orthoscopic) 3 open, ACL  . Tonsillectomy and adenoidectomy 1971  . Partial hysterectomy 1984? 1989?     for fibroids   . Trigger finger release 06/29/2008    release A-1 pulley and exc. cyst left thumb  . Breast lumpectomy 08/14/11    Left breast lumpectomy, left axillary sentinel node biopsy  .  Abdominal hysterectomy 1980's    ovaries intact  :  No current outpatient prescriptions on file.:    :  Allergies  Allergen Reactions  . Adhesive (Tape) Other (See Comments)    BLISTERS  . Codeine Nausea And Vomiting  :  Family History  Problem Relation Age of Onset  . Anesthesia problems Mother     post-op N/V  . Cancer Father     lung  . Cancer Maternal Aunt     ovarian  . Cancer Paternal Aunt     lung  . Cancer Paternal Uncle     un[place]  . Melanoma Cousin     female ,skin  :  History   Social History  . Marital Status: Single, has SO    Spouse Name: N/A    Number of Children: N/A  . Years of Education: N/A   Occupational History  . 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 Smoking status: Former Smoker -- 10 years    Types: Cigarettes  . Smokeless tobacco: Never Used   Comment: quit smoking 10 yrs. ago  . Alcohol Use: Yes     occasionally  . Drug Use: No     quit smoking 2002, smoked for 10 quit restrated for 5 more  . Sexually Active: Not on file  hrt 1 year     Other Topics Concern  . Not on file   Social History Narrative  . 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  Exam: Pleasant woman in no acute distress  General appearance: alert, cooperative and appears stated age Eyes: conjunctivae/corneas clear. PERRL, EOM's intact. Fundi benign. Throat: lips, mucosa, and tongue normal; teeth and gums normal Resp: clear to auscultation bilaterally and normal percussion bilaterally Breasts: normal appearance, no masses or tenderness, left axilla has a drain in place, port in place right anterior Cardio: regular rate and rhythm, S1, S2 normal, no murmur, click, rub or gallop and normal apical impulse GI: soft, non-tender; bowel sounds normal; no masses,  no organomegaly    Blood smear review:  n/a  Pathology:as above  Dg Eye Foreign Body  07/31/2011  *RADIOLOGY REPORT*  Clinical Data: 56 year old female with history of metal exposure to the orbits.  Planned MRI.  ORBITS FOR FOREIGN BODY - 2 VIEW  Comparison: None.  Findings: There is no evidence of metallic foreign body within the orbits.  No significant bone abnormality identified. .  IMPRESSION:  No evidence of metallic foreign body within the orbits.  Original Report Authenticated By: Harley Hallmark, M.D.   Chest 2 View  08/09/2011  *RADIOLOGY REPORT*  Clinical Data: Preop for breast surgery  CHEST - 2 VIEW  Comparison: Portable chest x-ray of 08/10/2010  Findings:  The lungs are clear.  Mediastinal contours appear normal.  The heart is within normal limits in size.  No bony abnormality is seen.  IMPRESSION: No active lung disease.  Original Report Authenticated By: Juline Patch, M.D.   Mr Breast Bilateral W Wo Contrast  08/01/2011  *RADIOLOGY REPORT*  Clinical Data: Newly diagnosed invasive mammary carcinoma of the left breast  BILATERAL BREAST MRI WITH AND WITHOUT CONTRAST  Technique: Multiplanar, multisequence MR images of both breasts were obtained prior to and following the intravenous administration of 17ml of multihance.  Three dimensional images were evaluated at the independent DynaCad workstation.  Comparison:  Mammograms from [facility] dated 07/24/2011, 07/23/2011 and 06/30/2010  Findings: There is a moderate background parenchymal enhancement pattern.  In the lateral aspect of the left breast there is an irregular, enhancing mass with satellite nodularity measuring 2.2 x 1.4 x 2.5 cm in anterior-posterior, transverse and longitudinal dimensions. It is associated with a biopsy clip artifact.  There is no enlarged axillary or internal mammary adenopathy.  No abnormal enhancement is seen in the right breast.  IMPRESSION: Solitary enhancing mass with adjacent satellite nodularity in the lateral aspect of the left breast  corresponding well with the known malignancy.  Treatment planning is recommended.  THREE-DIMENSIONAL MR IMAGE RENDERING ON INDEPENDENT WORKSTATION:  Three-dimensional MR images were rendered by post-processing of the original MR data on an independent workstation.  The three- dimensional MR images were interpreted, and findings were reported in the accompanying complete MRI report for this study.  BI-RADS CATEGORY 6:  Known biopsy-proven malignancy - appropriate action should be taken.  Original Report Authenticated By: Littie Deeds. Judyann Munson, M.D.   Nm Sentinel Node Inj-no Rpt (breast)  08/14/2011  CLINICAL DATA: preop   Sulfur colloid was injected intradermally by the nuclear medicine  technologist for breast cancer sentinel node localization.      Assessment and Plan:  56 year old woman with history of node positive lobular type  breast cancer. We am had a discussion today about adjuvant chemotherapy, side effects and enrollment in the B. 49 protocol. I explained the premise of the B. 49 protocol and the randomization. She will take the consent form were reviewed this and we'll make a decision hopefully begin chemotherapy next week. Mervin Hack MD [facility]

## 2011-09-06 ENCOUNTER — Telehealth: Payer: Self-pay | Admitting: *Deleted

## 2011-09-06 ENCOUNTER — Other Ambulatory Visit: Payer: Self-pay | Admitting: *Deleted

## 2011-09-06 ENCOUNTER — Ambulatory Visit (INDEPENDENT_AMBULATORY_CARE_PROVIDER_SITE_OTHER): Payer: 59 | Admitting: General Surgery

## 2011-09-06 ENCOUNTER — Other Ambulatory Visit: Payer: Self-pay | Admitting: Oncology

## 2011-09-06 ENCOUNTER — Encounter (INDEPENDENT_AMBULATORY_CARE_PROVIDER_SITE_OTHER): Payer: Self-pay | Admitting: General Surgery

## 2011-09-06 VITALS — BP 124/88 | HR 88 | Resp 16 | Ht 67.0 in | Wt 187.0 lb

## 2011-09-06 DIAGNOSIS — C50919 Malignant neoplasm of unspecified site of unspecified female breast: Secondary | ICD-10-CM

## 2011-09-06 DIAGNOSIS — Z09 Encounter for follow-up examination after completed treatment for conditions other than malignant neoplasm: Secondary | ICD-10-CM

## 2011-09-06 NOTE — Progress Notes (Signed)
Subjective:     Patient ID: CAMIA DIPINTO, female   DOB: 02/24/1956, 56 y.o.   MRN: [id number]  HPI 56 yof s/p left lumpectomy and snbx for stage IIb lobular cancer.  After multidisciplinary discussion we moved forward with completion left alnd and port placement.  Her path shows only four additional nodes that are all negative.  She is doing well without any significant complaints except for expected tingling and numbness.  She is moving her left arm well. She has also had a pet scan that is negative. Drain has been putting out about 30 cc per day for past 3 days. Review of Systems     Objective:   Physical Exam Well healing breast and axillary incisions without infection, drain with serous fluid   right port site without infection Assessment:     Stage IIb lobular cancer    Plan:     I have asked pathology to go back to see if they can identify any more nodes.  Her alnd was difficult due to scarring from sentinel node but there is not anymore tissue to remove. Will continue drain and she will call Monday about output hopefully out early next week. PT referral

## 2011-09-06 NOTE — Telephone Encounter (Signed)
left voice message to inform the patient of the new date and time of her chemo education class on 09-10-2011 at 10:00am

## 2011-09-06 NOTE — Telephone Encounter (Signed)
na

## 2011-09-06 NOTE — Telephone Encounter (Signed)
09/06/2011 3:06pm Received return phone call from patient stating that she is at [facility] [address] with her father who has just had a stroke. Patient aware of issues related to insurance coverage, but hopes that she will be able to participate in the research study. Plans made for patient to come to [facility] on Monday 09/10/11 at 9am, prior to chemo education class, to discuss the clinical trial further. She states that she has read the consent form in its entirety and she has researched the study online.

## 2011-09-06 NOTE — Telephone Encounter (Signed)
09/06/2011 2:30pm Left messages on patient's home answering machine and cell phone voice mail in regards to scheduling an appointment to discuss the B-49 clinical trial. Noted that we are awaiting a response to see if her insurance company will cover her participation in this trial. Suggested that patient plan to meet with research nurse on Monday, prior to or following her chemo education class scheduled for 10am. Will follow-up with patient on Friday.

## 2011-09-07 ENCOUNTER — Encounter: Payer: Self-pay | Admitting: Oncology

## 2011-09-07 ENCOUNTER — Telehealth: Payer: Self-pay | Admitting: *Deleted

## 2011-09-07 NOTE — Telephone Encounter (Signed)
patient called back and confirmed over the appointments for chemo education and echo

## 2011-09-07 NOTE — Telephone Encounter (Signed)
patient called in and confirmed she did receive my message about the echo and chemo class

## 2011-09-10 ENCOUNTER — Ambulatory Visit: Payer: 59 | Admitting: *Deleted

## 2011-09-10 ENCOUNTER — Encounter: Payer: Self-pay | Admitting: *Deleted

## 2011-09-10 ENCOUNTER — Other Ambulatory Visit: Payer: 59

## 2011-09-10 ENCOUNTER — Other Ambulatory Visit: Payer: 59 | Admitting: Lab

## 2011-09-10 ENCOUNTER — Ambulatory Visit (INDEPENDENT_AMBULATORY_CARE_PROVIDER_SITE_OTHER): Payer: 59 | Admitting: General Surgery

## 2011-09-10 ENCOUNTER — Telehealth: Payer: Self-pay | Admitting: Oncology

## 2011-09-10 ENCOUNTER — Encounter (INDEPENDENT_AMBULATORY_CARE_PROVIDER_SITE_OTHER): Payer: Self-pay | Admitting: General Surgery

## 2011-09-10 VITALS — BP 140/90 | HR 68 | Resp 20 | Ht 67.0 in | Wt 188.0 lb

## 2011-09-10 DIAGNOSIS — C50919 Malignant neoplasm of unspecified site of unspecified female breast: Secondary | ICD-10-CM

## 2011-09-10 DIAGNOSIS — Z09 Encounter for follow-up examination after completed treatment for conditions other than malignant neoplasm: Secondary | ICD-10-CM

## 2011-09-10 NOTE — Telephone Encounter (Signed)
lmonvm adviisng the pt of her next chemo appt on 09/18/2011@12 :15pm

## 2011-09-10 NOTE — Progress Notes (Signed)
Mailed after appt letter to pt. 

## 2011-09-10 NOTE — Progress Notes (Signed)
Subjective:     Patient ID: Emily Knight, female   DOB: Apr 02, 1956, 56 y.o.   MRN: [id number]  HPI 69 yof s/p left lumpectomy and eventual axillary lymph node dissection for a lobular cancer. This is a stage IIb breast cancer. She comes back in today to have her drain removed. It put out less than 30 cc for the last 2 days. I went over pathology with the pathologist. There were not any more nodes in the specimen.  Review of Systems     Objective:   Physical Exam Healing left axillary incision and left breast incision    Assessment:     Left breast cancer s/p lump/alnd    Plan:     I removed her drain today and she will begin chemotherapy

## 2011-09-10 NOTE — Progress Notes (Signed)
See Consent Form Encounter for additional details regarding consent process.

## 2011-09-12 ENCOUNTER — Telehealth: Payer: Self-pay | Admitting: *Deleted

## 2011-09-12 ENCOUNTER — Telehealth (INDEPENDENT_AMBULATORY_CARE_PROVIDER_SITE_OTHER): Payer: Self-pay | Admitting: General Surgery

## 2011-09-12 NOTE — Telephone Encounter (Signed)
Received phone call from patient this morning with question regarding off-study proposed treatment of FEC, including length of treatment and whether or not she would also receive a taxane. Regardless, patient says that she wants to enroll in the NSABP B-49 study and is satisfied with her treatment options and randomization within the trial.  Explained to patient that her estradiol result was not within the range of a post-menopausal female, therefore, for study purposes, she would be categorized as pre-menopausal. Assured patient that this does not affect her eligibility for the trial but is used to identify her menopausal status prior to study enrollment. Also explained that we are awaiting the results of tomorrow's echocardiogram and will have these results reviewed by Dr. Donnie Coffin on Friday prior to enrollment. Plans made to contact patient by Friday afternoon to notify her of treatment assignment.  Patient states that she has not received any prescriptions, including anti-emetics and EMLA cream. Also explained to patient that she may need to begin taking dexamethasone on Monday (Day -1) if she is randomized to Group 2. These prescriptions will be completed by Dr. Donnie Coffin on Friday, depending on treatment assignment.  Patient states that her preferred pharmacy is [facility] on [address]. EMR updated accordingly.

## 2011-09-12 NOTE — Telephone Encounter (Signed)
Pt was referred for PT; evaluation showed no lymphedema and good ROM.  April suggested sign and FAX orders for ABC class.

## 2011-09-13 ENCOUNTER — Ambulatory Visit (HOSPITAL_COMMUNITY)
Admission: RE | Admit: 2011-09-13 | Discharge: 2011-09-13 | Disposition: A | Discharge status: Home / Self Care | Payer: 59 | Source: Ambulatory Visit | Attending: Oncology | Admitting: Oncology

## 2011-09-13 DIAGNOSIS — C50919 Malignant neoplasm of unspecified site of unspecified female breast: Secondary | ICD-10-CM | POA: Insufficient documentation

## 2011-09-13 NOTE — Progress Notes (Signed)
*  PRELIMINARY RESULTS* Echocardiogram 2D Echocardiogram has been performed.  Katheren Puller 09/13/2011, 12:20 PM

## 2011-09-14 ENCOUNTER — Other Ambulatory Visit: Payer: Self-pay | Admitting: Oncology

## 2011-09-14 ENCOUNTER — Encounter: Payer: Self-pay | Admitting: *Deleted

## 2011-09-14 ENCOUNTER — Telehealth: Payer: Self-pay | Admitting: Oncology

## 2011-09-14 ENCOUNTER — Other Ambulatory Visit: Payer: Self-pay | Admitting: *Deleted

## 2011-09-14 DIAGNOSIS — C50419 Malignant neoplasm of upper-outer quadrant of unspecified female breast: Secondary | ICD-10-CM

## 2011-09-14 DIAGNOSIS — C50919 Malignant neoplasm of unspecified site of unspecified female breast: Secondary | ICD-10-CM

## 2011-09-14 MED ORDER — LIDOCAINE-PRILOCAINE 2.5-2.5 % EX CREA: TOPICAL_CREAM | Freq: Once | CUTANEOUS | Status: DC

## 2011-09-14 NOTE — Telephone Encounter (Signed)
lmonvm adviisng the pt of her injection appt on 09/19/2011

## 2011-09-14 NOTE — Progress Notes (Signed)
[facility] Psychosocial Distress Screening Clinical Social Work  Clinical Social Work was referred by distress screening protocol.  The patient scored a 9 on the Psychosocial Distress Thermometer which indicates severe distress. Clinical Social Worker was unable to meet with the pt in the exam room; therefore contacted pt at home to assess for distress and other psychosocial needs.  After multiple attempts to reach pt by phone CSW left a message offering support and information on the support services at [facility].  CSW encouraged pt to call with any questions or concerns.      Clinical Social Worker follow up needed:not at this time  Tamala Julian, MSW, LCSW Clinical Social Worker [facility] [phone]

## 2011-09-17 ENCOUNTER — Other Ambulatory Visit: Payer: Self-pay | Admitting: *Deleted

## 2011-09-17 ENCOUNTER — Other Ambulatory Visit: Payer: Self-pay | Admitting: Physician Assistant

## 2011-09-17 MED ORDER — DEXAMETHASONE 4 MG PO TABS: 4.0000 mg | ORAL_TABLET | Freq: Two times a day (BID) | ORAL | Status: AC

## 2011-09-17 MED ORDER — PROCHLORPERAZINE MALEATE 10 MG PO TABS: 10.0000 mg | ORAL_TABLET | Freq: Four times a day (QID) | ORAL | Status: DC | PRN

## 2011-09-17 MED ORDER — LORAZEPAM 0.5 MG PO TABS: 0.5000 mg | ORAL_TABLET | Freq: Four times a day (QID) | ORAL | Status: AC | PRN

## 2011-09-17 MED ORDER — ONDANSETRON HCL 8 MG PO TABS: 8.0000 mg | ORAL_TABLET | Freq: Two times a day (BID) | ORAL | Status: AC | PRN

## 2011-09-18 ENCOUNTER — Ambulatory Visit (HOSPITAL_BASED_OUTPATIENT_CLINIC_OR_DEPARTMENT_OTHER): Payer: 59

## 2011-09-18 VITALS — BP 147/89 | HR 62 | Temp 97.7°F

## 2011-09-18 DIAGNOSIS — Z170 Estrogen receptor positive status [ER+]: Secondary | ICD-10-CM

## 2011-09-18 DIAGNOSIS — Z5111 Encounter for antineoplastic chemotherapy: Secondary | ICD-10-CM

## 2011-09-18 DIAGNOSIS — C50919 Malignant neoplasm of unspecified site of unspecified female breast: Secondary | ICD-10-CM

## 2011-09-18 DIAGNOSIS — C50419 Malignant neoplasm of upper-outer quadrant of unspecified female breast: Secondary | ICD-10-CM

## 2011-09-18 MED ORDER — LORAZEPAM 2 MG/ML IJ SOLN
0.5000 mg | Freq: Once | INTRAMUSCULAR | Status: AC
  Administered 2011-09-18: 0.5 mg via INTRAVENOUS

## 2011-09-18 MED ORDER — DEXAMETHASONE SODIUM PHOSPHATE 4 MG/ML IJ SOLN
12.0000 mg | Freq: Once | INTRAMUSCULAR | Status: AC
  Administered 2011-09-18: 12 mg via INTRAVENOUS

## 2011-09-18 MED ORDER — DOXORUBICIN HCL CHEMO IV INJECTION 2 MG/ML
60.0000 mg/m2 | Freq: Once | INTRAVENOUS | Status: AC
  Administered 2011-09-18: 120 mg via INTRAVENOUS
  Filled 2011-09-18: qty 60

## 2011-09-18 MED ORDER — FOSAPREPITANT DIMEGLUMINE INJECTION 150 MG
150.0000 mg | Freq: Once | INTRAVENOUS | Status: AC
  Administered 2011-09-18: 150 mg via INTRAVENOUS
  Filled 2011-09-18: qty 5

## 2011-09-18 MED ORDER — SODIUM CHLORIDE 0.9 % IV SOLN: Freq: Once | INTRAVENOUS | Status: DC

## 2011-09-18 MED ORDER — SODIUM CHLORIDE 0.9 % IV SOLN
600.0000 mg/m2 | Freq: Once | INTRAVENOUS | Status: AC
  Administered 2011-09-18: 1200 mg via INTRAVENOUS
  Filled 2011-09-18: qty 60

## 2011-09-18 MED ORDER — PALONOSETRON HCL INJECTION 0.25 MG/5ML
0.2500 mg | Freq: Once | INTRAVENOUS | Status: AC
  Administered 2011-09-18: 0.25 mg via INTRAVENOUS

## 2011-09-18 NOTE — Progress Notes (Signed)
Patient in to clinic today for first treatment. Sign for infusion given to [name] RN.

## 2011-09-19 ENCOUNTER — Telehealth: Payer: Self-pay

## 2011-09-19 ENCOUNTER — Ambulatory Visit (HOSPITAL_BASED_OUTPATIENT_CLINIC_OR_DEPARTMENT_OTHER): Payer: 59

## 2011-09-19 VITALS — BP 128/84 | HR 61 | Temp 96.9°F

## 2011-09-19 DIAGNOSIS — C50419 Malignant neoplasm of upper-outer quadrant of unspecified female breast: Secondary | ICD-10-CM

## 2011-09-19 DIAGNOSIS — Z5189 Encounter for other specified aftercare: Secondary | ICD-10-CM

## 2011-09-19 DIAGNOSIS — C50919 Malignant neoplasm of unspecified site of unspecified female breast: Secondary | ICD-10-CM

## 2011-09-19 MED ORDER — PEGFILGRASTIM INJECTION 6 MG/0.6ML
6.0000 mg | Freq: Once | SUBCUTANEOUS | Status: AC
  Administered 2011-09-19: 6 mg via SUBCUTANEOUS
  Filled 2011-09-19: qty 0.6

## 2011-09-19 NOTE — Telephone Encounter (Signed)
Message copied by Albertha Ghee on Wed Sep 19, 2011 11:27 AM ------      Message from: Tylene Fantasia      Created: Tue Sep 18, 2011  3:15 PM      Regarding: Chemo follow up call      Contact: [phone]       First time Adria, Cytoxan.  Dr. Donnie Coffin.            Thanks       Morrie Sheldon

## 2011-09-19 NOTE — Telephone Encounter (Signed)
Contacted pt re: 1st A/C yesterday.  Pt reports "feeling great."  One episode of nausea last pm resolved by Compazine.  Denies diarrhea or constipation. Denies fatigue.  Reviewed how to take home anti-emetics.  Aware of appt for Neulasta today.  Denies questions. dph

## 2011-09-24 ENCOUNTER — Other Ambulatory Visit: Payer: Self-pay | Admitting: *Deleted

## 2011-09-24 ENCOUNTER — Other Ambulatory Visit: Payer: Self-pay | Admitting: Physician Assistant

## 2011-09-24 DIAGNOSIS — C50919 Malignant neoplasm of unspecified site of unspecified female breast: Secondary | ICD-10-CM

## 2011-09-24 MED ORDER — CIPROFLOXACIN HCL 500 MG PO TABS: 500.0000 mg | ORAL_TABLET | Freq: Two times a day (BID) | ORAL | Status: AC

## 2011-09-25 ENCOUNTER — Telehealth: Payer: Self-pay | Admitting: *Deleted

## 2011-09-25 ENCOUNTER — Other Ambulatory Visit (HOSPITAL_BASED_OUTPATIENT_CLINIC_OR_DEPARTMENT_OTHER): Payer: 59 | Admitting: Lab

## 2011-09-25 ENCOUNTER — Other Ambulatory Visit: Payer: 59 | Admitting: Lab

## 2011-09-25 ENCOUNTER — Ambulatory Visit (HOSPITAL_BASED_OUTPATIENT_CLINIC_OR_DEPARTMENT_OTHER): Payer: 59 | Admitting: Physician Assistant

## 2011-09-25 ENCOUNTER — Encounter: Payer: Self-pay | Admitting: Physician Assistant

## 2011-09-25 ENCOUNTER — Ambulatory Visit: Payer: 59 | Admitting: Physician Assistant

## 2011-09-25 VITALS — BP 133/83 | HR 73 | Temp 98.1°F | Ht 67.0 in | Wt 188.8 lb

## 2011-09-25 DIAGNOSIS — C50919 Malignant neoplasm of unspecified site of unspecified female breast: Secondary | ICD-10-CM

## 2011-09-25 DIAGNOSIS — C50419 Malignant neoplasm of upper-outer quadrant of unspecified female breast: Secondary | ICD-10-CM

## 2011-09-25 DIAGNOSIS — Z170 Estrogen receptor positive status [ER+]: Secondary | ICD-10-CM

## 2011-09-25 LAB — LACTATE DEHYDROGENASE: LDH: 105 U/L (ref 94–250)

## 2011-09-25 LAB — CBC WITH DIFFERENTIAL/PLATELET
Basophils Absolute: 0 10*3/uL (ref 0.0–0.1)
Eosinophils Absolute: 0.2 10*3/uL (ref 0.0–0.5)
HGB: 13.5 g/dL (ref 11.6–15.9)
LYMPH%: 33.9 % (ref 14.0–49.7)
MCV: 91.7 fL (ref 79.5–101.0)
MONO%: 10.7 % (ref 0.0–14.0)
NEUT#: 1.7 10*3/uL (ref 1.5–6.5)
Platelets: 190 10*3/uL (ref 145–400)

## 2011-09-25 LAB — COMPREHENSIVE METABOLIC PANEL
BUN: 16 mg/dL (ref 6–23)
CO2: 24 mEq/L (ref 19–32)
Creatinine, Ser: 0.69 mg/dL (ref 0.50–1.10)
Glucose, Bld: 93 mg/dL (ref 70–99)
Total Bilirubin: 0.3 mg/dL (ref 0.3–1.2)

## 2011-09-25 NOTE — Telephone Encounter (Signed)
gave patient appointment for 09-2011 thru 10-2011 printed out calendar and gave to the patient

## 2011-09-25 NOTE — Patient Instructions (Signed)
1.) Continue Cipro 500mg  twice a day through 09/28/11  2.) Lorazepam (Ativan) 0.5mg , use 1/2-2 tab as needed for nausea (you may use sublingually)  3.) Keep in mind, for the first 3 days: if a headache occurs, you may use Benadryl 25mg  every 6-8 hours (Zofran may also cause this to occur)

## 2011-09-27 NOTE — Progress Notes (Signed)
Hematology and Oncology Follow Up Visit  Emily Knight [id number] 1956/03/13 56 y.o. 09/25/11    HPI: [name] is a 56 year old [place] woman with a history of a T2, N1 C., nuclear grade 2 invasive lobular carcinoma of the left breast for which she underwent a left lumpectomy with sentinel node dissection on 08/14/2011  revealing a 2.2 cm primary lesion with 2/3 sentinal nodes involved with metastatic disease with extracapsular extension. Tumor was noted to be ER/PR positive at 20/10% respectively, HER-2 negative, with a Ki-67 of 30%. She underwent reexcision of the left axillary lymph nodes on 08/28/2011 which showed 0 of 4 nodes involved. She is participating in NSABP B-49, arm one, currently day 7 cycle one of 4 planned adjuvant dose dense Adriamycin/Cytoxan.  Interim History:   Emily Knight is seen today for followup after her first of 4 planned adjuvant dose dense Adriamycin/Cytoxan. She did receive Neulasta on day 2. Overall she actually feels pretty well denying any unexplained fevers, chills, night sweats, no shortness of breath or chest pain issues. She's had no persistent nausea, emesis, diarrhea or constipation issues. Energy level is fairly good, she has not appreciated any alopecia as of yet. A detailed review of systems is otherwise noncontributory as noted below.  Review of Systems: Constitutional:  no weight loss, fever, night sweats and feels well Eyes: uses glasses ENT: no complaints Cardiovascular: no chest pain or dyspnea on exertion Respiratory: no cough, shortness of breath, or wheezing Neurological: no TIA or stroke symptoms Dermatological: negative Gastrointestinal: no abdominal pain, change in bowel habits, or black or bloody stools Genito-Urinary: no dysuria, trouble voiding, or hematuria Hematological and Lymphatic: negative Breast: negative for breast lumps Musculoskeletal: negative Remaining ROS negative.   Medications:   I have reviewed the  patient's current medications.  Current Outpatient Prescriptions  Medication Sig Dispense Refill  . ciprofloxacin (CIPRO) 500 MG tablet Take 1 tablet (500 mg total) by mouth 2 (two) times daily.  14 tablet  0  . dexamethasone (DECADRON) 4 MG tablet Take 1 tablet (4 mg total) by mouth 2 (two) times daily with a meal. Dex 4mg  tabs 2 tabs Bid day 2,3 and 4 post chemo then stop  30 tablet  2  . lidocaine-prilocaine (EMLA) cream Apply 1 application topically as needed.      Marland Kitchen LORazepam (ATIVAN) 0.5 MG tablet Take 1 tablet (0.5 mg total) by mouth every 6 (six) hours as needed for anxiety.  30 tablet  1  . oxyCODONE-acetaminophen (PERCOCET) 5-325 MG per tablet Take 1 tablet by mouth every 6 (six) hours as needed.      . prochlorperazine (COMPAZINE) 10 MG tablet Take 1 tablet (10 mg total) by mouth every 6 (six) hours as needed. 1 tab every 6 hours as needed for breakthrough nausea  30 tablet  2   No current facility-administered medications for this visit.   Facility-Administered Medications Ordered in Other Visits  Medication Dose Route Frequency Provider Last Rate Last Dose  . lidocaine-prilocaine (EMLA) cream   Topical Once Pierce Crane, MD        Allergies:  Allergies  Allergen Reactions  . Adhesive (Tape) Other (See Comments)    BLISTERS  . Codeine Nausea And Vomiting    Physical Exam: Filed Vitals:   09/25/11 1356  BP: 133/83  Pulse: 73  Temp: 98.1 F (36.7 C)    Body mass index is 29.57 kg/(m^2). Weight: 188 lbs. HEENT:  Sclerae anicteric, conjunctivae pink.  Oropharynx clear.  No mucositis  or candidiasis.   Nodes:  No cervical, supraclavicular, or axillary lymphadenopathy palpated.  Breast Exam:  Deferred.   Lungs:  Clear to auscultation bilaterally.  No crackles, rhonchi, or wheezes.   Heart:  Regular rate and rhythm.   Abdomen:  Soft, nontender.  Positive bowel sounds.  No organomegaly or masses palpated.   Musculoskeletal:  No focal spinal tenderness to palpation.    Extremities:  Benign.  No peripheral edema or cyanosis.   Skin:  Benign.   Neuro:  Nonfocal, alert and oriented x 3.   Lab Results: Lab Results  Component Value Date   WBC 3.4* 09/25/2011   HGB 13.5 09/25/2011   HCT 38.5 09/25/2011   MCV 91.7 09/25/2011   PLT 190 09/25/2011   NEUTROABS 1.7 09/25/2011     Chemistry      Component Value Date/Time   NA 134* 09/25/2011 1311   K 4.2 09/25/2011 1311   CL 102 09/25/2011 1311   CO2 24 09/25/2011 1311   BUN 16 09/25/2011 1311   CREATININE 0.69 09/25/2011 1311      Component Value Date/Time   CALCIUM 9.0 09/25/2011 1311   ALKPHOS 74 09/25/2011 1311   AST 11 09/25/2011 1311   ALT 15 09/25/2011 1311   BILITOT 0.3 09/25/2011 1311      Lab Results  Component Value Date   LABCA2 27 09/05/2011    Radiological Studies: Nm Pet Image Initial (pi) Skull Base To Thigh 09/04/2011  *RADIOLOGY REPORT*  Clinical Data:  Initial treatment strategy for left breast cancer.  NUCLEAR MEDICINE PET CT INITIAL (PI) SKULL BASE TO THIGH  Technique:  18.9 mCi F-18 FDG was injected intravenously via the right antecubital fossa.  Full-ring PET imaging was performed from the skull base through the mid-thighs 65  minutes after injection. CT data was obtained and used for attenuation correction and anatomic localization only.  (This was not acquired as a diagnostic CT examination.)  Fasting Blood Glucose:  95  Patient Weight:  186 pounds.  Comparison:  None.  Findings: Postsurgical changes/seroma in the upper outer left breast.  Associated vague hypermetabolism, max SUV 3.5, favored to reflect postsurgical change.  Postsurgical changes in the left axilla.  Indwelling surgical drain.  Associated hypermetabolism with max SUV 4.1, also favored to be postsurgical.  No suspicious hypermetabolic foci to suggest distant metastases.  No suspicious cervical lymphadenopathy.  3 mm left upper lobe nodule (series 2/image 83).  Mild patchy opacity in the right middle lobe and the lingula.  No pleural effusion or  pneumothorax.  Right chest port.  Atherosclerotic calcifications of the aorta and branch vessels.  No evidence of bowel obstruction.  Normal appendix.  Tiny bilateral fat containing inguinal hernias.  Status post hysterectomy. Bladder is mildly thick-walled but underdistended.  Scattered tiny sclerotic lesions in the thoracolumbar spine and bilateral pelvis.  IMPRESSION: Postsurgical changes/seroma in the upper outer left breast. Associated vague hypermetabolism, max SUV 3.5, favored to reflect postsurgical change.  Postsurgical changes in the left axilla with indwelling surgical drain.  Associated hypermetabolism with max SUV 4.1, also favored to be postsurgical.  No hypermetabolic foci to suggest distant metastases.  3 mm left upper lobe pulmonary nodule.  Scattered tiny sclerotic lesions in the thoracolumbar spine and bilateral pelvis.  In the absence of prior imaging to document stability, attention on follow- up is suggested.  Original Report Authenticated By: Charline Bills, M.D.     Assessment:  Ms. Danielsen is a 56 year old [place] woman with a  history of a T2, N1 C., nuclear grade 2 invasive lobular carcinoma of the left breast for which she underwent a left lumpectomy with sentinel node dissection on 08/14/2011  revealing a 2.2 cm primary lesion with 2/3 sentinal nodes involved with metastatic disease with extracapsular extension. Tumor was noted to be ER/PR positive at 20/10% respectively, HER-2 negative, with a Ki-67 of 30%. She underwent reexcision of the left axillary lymph nodes on 08/28/2011 which showed 0 of 4 nodes involved. She is participating in NSABP B-49, arm one, currently day 7 cycle one of 4 planned adjuvant dose dense Adriamycin/Cytoxan.  ECOG: 0 Case reviewed with Dr. Pierce Crane.    Plan:  Radha will return on 10/02/2011 for followup exam prior to day 1 cycle 2 of 4 planned adjuvant dose dense Adriamycin/Cytoxan. A CBC and serum chemistry along with LDH  will be obtained prior. She knows to contact us in the interim if the need should arise.  This plan was reviewed with the patient, who voices understanding and agreement.  She knows to call with any changes or problems.    Boyce Keltner T, PA-C 09/25/11

## 2011-09-28 ENCOUNTER — Encounter: Payer: Self-pay | Admitting: *Deleted

## 2011-09-28 ENCOUNTER — Telehealth: Payer: Self-pay | Admitting: Oncology

## 2011-09-28 ENCOUNTER — Other Ambulatory Visit: Payer: Self-pay | Admitting: Oncology

## 2011-09-28 NOTE — Progress Notes (Signed)
Pt reports that she is experiencing throbbing in lower back during diarrhea. Pt reports that diarrhea has subsided but is concerned about lower back pain. Per CS and MD, it is thought to be Nulasta related. This desk nurse instructed pt to call "on call" MD if sx persist. Pt expresses that she has pain medications if needed however the pain is not severe enough for her to take any. Pt states that she will call this desk on Monday 10-01-11  With new or worsening sx

## 2011-09-28 NOTE — Telephone Encounter (Signed)
Emily Knight called at 4:30 on Friday 09/28/11 with a question.  She said that she had diarrhea on Thursday several times but it is better today with diet.  Her question is when she was having the diarrhea she was having a throbbing pain in her back. She wanted to know if that was normal since she is receiving chemo.  I asked Emily Knight my manager what to do and I got her phone number and she took the information down to Dr. Renelda Loma nurse to call the patient back.

## 2011-10-02 ENCOUNTER — Ambulatory Visit: Payer: 59

## 2011-10-02 ENCOUNTER — Encounter: Payer: Self-pay | Admitting: *Deleted

## 2011-10-02 ENCOUNTER — Ambulatory Visit (HOSPITAL_BASED_OUTPATIENT_CLINIC_OR_DEPARTMENT_OTHER): Payer: 59

## 2011-10-02 ENCOUNTER — Ambulatory Visit (HOSPITAL_BASED_OUTPATIENT_CLINIC_OR_DEPARTMENT_OTHER): Payer: 59 | Admitting: Physician Assistant

## 2011-10-02 ENCOUNTER — Other Ambulatory Visit (HOSPITAL_BASED_OUTPATIENT_CLINIC_OR_DEPARTMENT_OTHER): Payer: 59 | Admitting: Lab

## 2011-10-02 ENCOUNTER — Other Ambulatory Visit: Payer: Self-pay | Admitting: *Deleted

## 2011-10-02 ENCOUNTER — Telehealth: Payer: Self-pay | Admitting: *Deleted

## 2011-10-02 ENCOUNTER — Encounter: Payer: Self-pay | Admitting: Physician Assistant

## 2011-10-02 VITALS — BP 138/86 | HR 72 | Temp 97.6°F | Ht 67.0 in | Wt 189.4 lb

## 2011-10-02 DIAGNOSIS — Z5111 Encounter for antineoplastic chemotherapy: Secondary | ICD-10-CM

## 2011-10-02 DIAGNOSIS — C50912 Malignant neoplasm of unspecified site of left female breast: Secondary | ICD-10-CM

## 2011-10-02 DIAGNOSIS — C50919 Malignant neoplasm of unspecified site of unspecified female breast: Secondary | ICD-10-CM

## 2011-10-02 DIAGNOSIS — Z170 Estrogen receptor positive status [ER+]: Secondary | ICD-10-CM

## 2011-10-02 DIAGNOSIS — C50419 Malignant neoplasm of upper-outer quadrant of unspecified female breast: Secondary | ICD-10-CM

## 2011-10-02 LAB — CBC WITH DIFFERENTIAL/PLATELET
BASO%: 0.7 % (ref 0.0–2.0)
HCT: 37.7 % (ref 34.8–46.6)
HGB: 13.3 g/dL (ref 11.6–15.9)
MCHC: 35.3 g/dL (ref 31.5–36.0)
MONO#: 0.4 10*3/uL (ref 0.1–0.9)
NEUT%: 79.2 % — ABNORMAL HIGH (ref 38.4–76.8)
WBC: 10.2 10*3/uL (ref 3.9–10.3)
lymph#: 1.6 10*3/uL (ref 0.9–3.3)

## 2011-10-02 LAB — COMPREHENSIVE METABOLIC PANEL
ALT: 21 U/L (ref 0–35)
Albumin: 3.8 g/dL (ref 3.5–5.2)
CO2: 25 mEq/L (ref 19–32)
Calcium: 9.4 mg/dL (ref 8.4–10.5)
Chloride: 100 mEq/L (ref 96–112)
Creatinine, Ser: 0.8 mg/dL (ref 0.50–1.10)
Glucose, Bld: 91 mg/dL (ref 70–99)
Potassium: 4.2 mEq/L (ref 3.5–5.3)
Total Bilirubin: 0.3 mg/dL (ref 0.3–1.2)
Total Protein: 7.1 g/dL (ref 6.0–8.3)

## 2011-10-02 LAB — LACTATE DEHYDROGENASE: LDH: 197 U/L (ref 94–250)

## 2011-10-02 MED ORDER — DOXORUBICIN HCL CHEMO IV INJECTION 2 MG/ML
60.0000 mg/m2 | Freq: Once | INTRAVENOUS | Status: AC
  Administered 2011-10-02: 122 mg via INTRAVENOUS
  Filled 2011-10-02: qty 61

## 2011-10-02 MED ORDER — SODIUM CHLORIDE 0.9 % IV SOLN
150.0000 mg | Freq: Once | INTRAVENOUS | Status: AC
  Administered 2011-10-02: 150 mg via INTRAVENOUS
  Filled 2011-10-02: qty 5

## 2011-10-02 MED ORDER — HEPARIN SOD (PORK) LOCK FLUSH 100 UNIT/ML IV SOLN
500.0000 [IU] | Freq: Once | INTRAVENOUS | Status: AC | PRN
  Administered 2011-10-02: 500 [IU]
  Filled 2011-10-02: qty 5

## 2011-10-02 MED ORDER — DEXAMETHASONE SODIUM PHOSPHATE 4 MG/ML IJ SOLN
12.0000 mg | Freq: Once | INTRAMUSCULAR | Status: AC
  Administered 2011-10-02: 12 mg via INTRAVENOUS

## 2011-10-02 MED ORDER — SODIUM CHLORIDE 0.9 % IJ SOLN
10.0000 mL | INTRAMUSCULAR | Status: DC | PRN
  Administered 2011-10-02: 10 mL
  Filled 2011-10-02: qty 10

## 2011-10-02 MED ORDER — PALONOSETRON HCL INJECTION 0.25 MG/5ML
0.2500 mg | Freq: Once | INTRAVENOUS | Status: AC
  Administered 2011-10-02: 0.25 mg via INTRAVENOUS

## 2011-10-02 MED ORDER — SODIUM CHLORIDE 0.9 % IV SOLN
Freq: Once | INTRAVENOUS | Status: AC
  Administered 2011-10-02: 12:00:00 via INTRAVENOUS

## 2011-10-02 MED ORDER — SODIUM CHLORIDE 0.9 % IV SOLN
600.0000 mg/m2 | Freq: Once | INTRAVENOUS | Status: AC
  Administered 2011-10-02: 1220 mg via INTRAVENOUS
  Filled 2011-10-02: qty 61

## 2011-10-02 NOTE — Patient Instructions (Signed)
[facility] Discharge Instructions for Patients Receiving Chemotherapy  Today you received the following chemotherapy agents Cytoxan and Adriamycin  To help prevent nausea and vomiting after your treatment, we encourage you to take your nausea medication as prescribed by MD.    If you develop nausea and vomiting that is not controlled by your nausea medication, call the clinic. If it is after clinic hours your family physician or the after hours number for the clinic or go to the Emergency Department.   BELOW ARE SYMPTOMS THAT SHOULD BE REPORTED IMMEDIATELY:  *FEVER GREATER THAN 100.5 F  *CHILLS WITH OR WITHOUT FEVER  NAUSEA AND VOMITING THAT IS NOT CONTROLLED WITH YOUR NAUSEA MEDICATION  *UNUSUAL SHORTNESS OF BREATH  *UNUSUAL BRUISING OR BLEEDING  TENDERNESS IN MOUTH AND THROAT WITH OR WITHOUT PRESENCE OF ULCERS  *URINARY PROBLEMS  *BOWEL PROBLEMS  UNUSUAL RASH Items with * indicate a potential emergency and should be followed up as soon as possible.  One of the nurses will contact you 24 hours after your treatment. Please let the nurse know about any problems that you may have experienced. Feel free to call the clinic you have any questions or concerns. The clinic phone number is [phone].   I have been informed and understand all the instructions given to me. I know to contact the clinic, my physician, or go to the Emergency Department if any problems should occur. I do not have any questions at this time, but understand that I may call the clinic during office hours   should I have any questions or need assistance in obtaining follow up care.    __________________________________________  _____________  __________ Signature of Patient or Authorized Representative            Date                   Time    __________________________________________ Nurse's Signature

## 2011-10-02 NOTE — Telephone Encounter (Signed)
printed out calendar and gave to the patient 

## 2011-10-02 NOTE — Progress Notes (Signed)
Patient in to clinic today for evaluation and Cycle 2 treatment. She states she has felt fully recovered since last week, and has experienced no toxicities requiring dose modification per protocol Table 14. Patient was given Cipro on 09/24/2011 for complaints of sore throat, however, this was felt to possibly be allergy related symptoms. Patient did not complete the Cipro prescription due to nausea associated with taking the medication, and her symptoms resolved. She reports mild headache, felt to be possibly related to Zofran. Patient experienced grade 2 diarrhea lasting 24 hours last week (approximately seven stools in a 24-hour period, an increase of six over baseline), however this was felt to be related to diet and/or stool softener use; no dose modification indicated for grade 2 diarrhea that resolves prior to treatment. Dose modification not felt to be indicated for grade 2 oral mucositis with small, healing mouth ulcer (grade 2 mucositis (clinical exam)), that resulted in modification of diet to soft foods, but did not interfere with her intake (grade 2 mucositis (funtional/symptomatic)). Based on lab results review and physical exam today by Debbora Presto, PA, patient condition is acceptable for re-treatment. Patient experienced moderate (grade 2) back pain last week, felt to be related to Neulasta, but pain has since resolved.  Sign for infusion given to Amy Horton RN.

## 2011-10-02 NOTE — Progress Notes (Signed)
Hematology and Oncology Follow Up Visit  Emily Knight [id number] 07-05-1955 56 y.o. 09/25/11    HPI: Emily Knight is a 56 year old [place] woman with a history of a T2, N1 C., nuclear grade 2 invasive lobular carcinoma of the left breast for which she underwent a left lumpectomy with sentinel node dissection on 08/14/2011  revealing a 2.2 cm primary lesion with 2/3 sentinal nodes involved with metastatic disease with extracapsular extension. Tumor was noted to be ER/PR positive at 20/10% respectively, HER-2 negative, with a Ki-67 of 30%. She underwent reexcision of the left axillary lymph nodes on 08/28/2011 which showed 0 of 4 nodes involved. She is participating in NSABP B-49, arm one, currently for day 1 cycle 2/4 planned adjuvant dose dense Adriamycin/Cytoxan.  Interim History:   Emily Knight is seen today for followup prior to day 1 cycle 2/4 planned adjuvant dose dense Adriamycin/Cytoxan per NSABP B-49 arm 1.  She is feeling well, denying any unexplained fevers, chills, or night sweats. No nausea, emesis issues. On 09/27/2010, she did have approximately 7 looser stools within a 24-hour period, she does admit that she ate rich foods that evening and suspected was more dietary than anything else. He has totally resolved. She denies a diffuse bony pain, though she did experience some low back "throbbing" type of pain which lasted for approximately one day but has dissipated. She denies a diffuse long bone pain. She denies bleeding or bruising symptoms. Her overall energy level has recovered quite well. She is able to perform ADLs without any difficulty. She also describes some evidence of "small ulcers" on her gum line inner upper jaw region, she has a prior history of herpes simplex, and back she has Valtrex at home that she initiates once a fever blister starts. A detailed review of systems is otherwise noncontributory as noted below.  Review of Systems: Constitutional:  no weight loss,  fever, night sweats and feels well Eyes: uses glasses ENT: no complaints Cardiovascular: no chest pain or dyspnea on exertion Respiratory: no cough, shortness of breath, or wheezing Neurological: no TIA or stroke symptoms Dermatological: negative Gastrointestinal: no abdominal pain, change in bowel habits, or black or bloody stools Genito-Urinary: no dysuria, trouble voiding, or hematuria Hematological and Lymphatic: negative Breast: negative for breast lumps Musculoskeletal: negative Remaining ROS negative.   Medications:   I have reviewed the patient's current medications.  Current Outpatient Prescriptions  Medication Sig Dispense Refill  . ciprofloxacin (CIPRO) 500 MG tablet Take 1 tablet (500 mg total) by mouth 2 (two) times daily.  14 tablet  0  . lidocaine-prilocaine (EMLA) cream Apply 1 application topically as needed.      Marland Kitchen oxyCODONE-acetaminophen (PERCOCET) 5-325 MG per tablet Take 1 tablet by mouth every 6 (six) hours as needed.      . prochlorperazine (COMPAZINE) 10 MG tablet Take 1 tablet (10 mg total) by mouth every 6 (six) hours as needed. 1 tab every 6 hours as needed for breakthrough nausea  30 tablet  2   No current facility-administered medications for this visit.   Facility-Administered Medications Ordered in Other Visits  Medication Dose Route Frequency Provider Last Rate Last Dose  . lidocaine-prilocaine (EMLA) cream   Topical Once Pierce Crane, MD        Allergies:  Allergies  Allergen Reactions  . Adhesive (Tape) Other (See Comments)    BLISTERS  . Codeine Nausea And Vomiting    Physical Exam: Filed Vitals:   10/02/11 1039  BP: 138/86  Pulse:  72  Temp: 97.6 F (36.4 C)    Body mass index is 29.66 kg/(m^2). Weight: 189 lbs. HEENT:  Sclerae anicteric, conjunctivae pink.  Oropharynx clear. There is a small healing ulcer on the far upper left buccal mucosa, minimal irritation of the gingival line.  Nodes:  No cervical, supraclavicular, or axillary  lymphadenopathy palpated.  Breast Exam:  Deferred.   Lungs:  Clear to auscultation bilaterally.  No crackles, rhonchi, or wheezes.   Heart:  Regular rate and rhythm.   Abdomen:  Soft, nontender.  Positive bowel sounds.  No organomegaly or masses palpated.   Musculoskeletal:  No focal spinal tenderness to palpation.  Extremities:  Benign.  No peripheral edema or cyanosis.   Skin:  Benign.   Neuro:  Nonfocal, alert and oriented x 3.   Lab Results: Lab Results  Component Value Date   WBC 10.2 10/02/2011   HGB 13.3 10/02/2011   HCT 37.7 10/02/2011   MCV 88.7 10/02/2011   PLT 177 10/02/2011   NEUTROABS 8.0* 10/02/2011     Chemistry      Component Value Date/Time   NA 133* 10/02/2011 1027   K 4.2 10/02/2011 1027   CL 100 10/02/2011 1027   CO2 25 10/02/2011 1027   BUN 14 10/02/2011 1027   CREATININE 0.80 10/02/2011 1027      Component Value Date/Time   CALCIUM 9.4 10/02/2011 1027   ALKPHOS 80 10/02/2011 1027   AST 19 10/02/2011 1027   ALT 21 10/02/2011 1027   BILITOT 0.3 10/02/2011 1027      Lab Results  Component Value Date   LABCA2 27 09/05/2011    Radiological Studies: Nm Pet Image Initial (pi) Skull Base To Thigh 09/04/2011  *RADIOLOGY REPORT*  Clinical Data:  Initial treatment strategy for left breast cancer.  NUCLEAR MEDICINE PET CT INITIAL (PI) SKULL BASE TO THIGH  Technique:  18.9 mCi F-18 FDG was injected intravenously via the right antecubital fossa.  Full-ring PET imaging was performed from the skull base through the mid-thighs 65  minutes after injection. CT data was obtained and used for attenuation correction and anatomic localization only.  (This was not acquired as a diagnostic CT examination.)  Fasting Blood Glucose:  95  Patient Weight:  186 pounds.  Comparison:  None.  Findings: Postsurgical changes/seroma in the upper outer left breast.  Associated vague hypermetabolism, max SUV 3.5, favored to reflect postsurgical change.  Postsurgical changes in the left axilla.  Indwelling  surgical drain.  Associated hypermetabolism with max SUV 4.1, also favored to be postsurgical.  No suspicious hypermetabolic foci to suggest distant metastases.  No suspicious cervical lymphadenopathy.  3 mm left upper lobe nodule (series 2/image 83).  Mild patchy opacity in the right middle lobe and the lingula.  No pleural effusion or pneumothorax.  Right chest port.  Atherosclerotic calcifications of the aorta and branch vessels.  No evidence of bowel obstruction.  Normal appendix.  Tiny bilateral fat containing inguinal hernias.  Status post hysterectomy. Bladder is mildly thick-walled but underdistended.  Scattered tiny sclerotic lesions in the thoracolumbar spine and bilateral pelvis.  IMPRESSION: Postsurgical changes/seroma in the upper outer left breast. Associated vague hypermetabolism, max SUV 3.5, favored to reflect postsurgical change.  Postsurgical changes in the left axilla with indwelling surgical drain.  Associated hypermetabolism with max SUV 4.1, also favored to be postsurgical.  No hypermetabolic foci to suggest distant metastases.  3 mm left upper lobe pulmonary nodule.  Scattered tiny sclerotic lesions in the thoracolumbar spine and  bilateral pelvis.  In the absence of prior imaging to document stability, attention on follow- up is suggested.  Original Report Authenticated By: Charline Bills, M.D.     Assessment:  Ms. Niday is a 56 year old [place] woman with a history of a T2, N1 C., nuclear grade 2 invasive lobular carcinoma of the left breast for which she underwent a left lumpectomy with sentinel node dissection on 08/14/2011  revealing a 2.2 cm primary lesion with 2/3 sentinal nodes involved with metastatic disease with extracapsular extension. Tumor was noted to be ER/PR positive at 20/10% respectively, HER-2 negative, with a Ki-67 of 30%. She underwent reexcision of the left axillary lymph nodes on 08/28/2011 which showed 0 of 4 nodes involved. She is  participating in NSABP B-49, arm one, currently for day 1 cycle 2/4 planned adjuvant dose dense Adriamycin/Cytoxan. 2. History of grade 2 loose stools which have completely normalized, and felt to be secondary to dietary changes. 3. 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 Prophylactic Valtrex at home.   ECOG: 0  Case reviewed with Dr. Pierce Crane.    Plan:  Zanaiya will receive treatment today as scheduled. She will then return on day 2 for Neulasta providing granulocytes support. I will see her in one week's time for nadir assessment. She will contact us in the interim if the need should arise.  This plan was reviewed with the patient, who voices understanding and agreement.  She knows to call with any changes or problems.    Shari Natt T, PA-C 09/25/11

## 2011-10-03 ENCOUNTER — Ambulatory Visit (HOSPITAL_BASED_OUTPATIENT_CLINIC_OR_DEPARTMENT_OTHER): Payer: 59

## 2011-10-03 VITALS — BP 141/82 | HR 62 | Temp 97.7°F

## 2011-10-03 DIAGNOSIS — Z5189 Encounter for other specified aftercare: Secondary | ICD-10-CM

## 2011-10-03 DIAGNOSIS — C50919 Malignant neoplasm of unspecified site of unspecified female breast: Secondary | ICD-10-CM

## 2011-10-03 DIAGNOSIS — C50419 Malignant neoplasm of upper-outer quadrant of unspecified female breast: Secondary | ICD-10-CM

## 2011-10-03 MED ORDER — PEGFILGRASTIM INJECTION 6 MG/0.6ML
6.0000 mg | Freq: Once | SUBCUTANEOUS | Status: AC
  Administered 2011-10-03: 6 mg via SUBCUTANEOUS
  Filled 2011-10-03: qty 0.6

## 2011-10-03 NOTE — Patient Instructions (Signed)
Call MD for problems 

## 2011-10-09 ENCOUNTER — Ambulatory Visit (HOSPITAL_BASED_OUTPATIENT_CLINIC_OR_DEPARTMENT_OTHER): Payer: 59 | Admitting: Physician Assistant

## 2011-10-09 ENCOUNTER — Encounter: Payer: Self-pay | Admitting: Physician Assistant

## 2011-10-09 ENCOUNTER — Other Ambulatory Visit (HOSPITAL_BASED_OUTPATIENT_CLINIC_OR_DEPARTMENT_OTHER): Payer: 59 | Admitting: Lab

## 2011-10-09 ENCOUNTER — Other Ambulatory Visit: Payer: Self-pay | Admitting: Physician Assistant

## 2011-10-09 VITALS — BP 130/87 | HR 72 | Temp 97.9°F | Ht 67.0 in | Wt 188.4 lb

## 2011-10-09 DIAGNOSIS — C50919 Malignant neoplasm of unspecified site of unspecified female breast: Secondary | ICD-10-CM

## 2011-10-09 DIAGNOSIS — C50419 Malignant neoplasm of upper-outer quadrant of unspecified female breast: Secondary | ICD-10-CM

## 2011-10-09 DIAGNOSIS — Z170 Estrogen receptor positive status [ER+]: Secondary | ICD-10-CM

## 2011-10-09 DIAGNOSIS — C773 Secondary and unspecified malignant neoplasm of axilla and upper limb lymph nodes: Secondary | ICD-10-CM

## 2011-10-09 DIAGNOSIS — R911 Solitary pulmonary nodule: Secondary | ICD-10-CM

## 2011-10-09 LAB — CBC WITH DIFFERENTIAL/PLATELET
Basophils Absolute: 0 10*3/uL (ref 0.0–0.1)
EOS%: 1.6 % (ref 0.0–7.0)
HCT: 35.6 % (ref 34.8–46.6)
HGB: 12.8 g/dL (ref 11.6–15.9)
LYMPH%: 29.4 % (ref 14.0–49.7)
MCH: 31.4 pg (ref 25.1–34.0)
MCHC: 36 g/dL (ref 31.5–36.0)
MCV: 87.3 fL (ref 79.5–101.0)
MONO%: 12.3 % (ref 0.0–14.0)
NEUT%: 55.9 % (ref 38.4–76.8)
Platelets: 227 10*3/uL (ref 145–400)
lymph#: 0.7 10*3/uL — ABNORMAL LOW (ref 0.9–3.3)

## 2011-10-09 NOTE — Progress Notes (Signed)
Hematology and Oncology Follow Up Visit  Emily Knight [id number] Feb 13, 1956 56 y.o. 10/09/11   HPI: Emily Knight is a 56 year old [place] woman with a history of a T2, N1 C., nuclear grade 2 invasive lobular carcinoma of the left breast for which she underwent a left lumpectomy with sentinel node dissection on 08/14/2011  revealing a 2.2 cm primary lesion with 2/3 sentinal nodes involved with metastatic disease with extracapsular extension. Tumor was noted to be ER/PR positive at 20/10% respectively, HER-2 negative, with a Ki-67 of 30%. She underwent reexcision of the left axillary lymph nodes on 08/28/2011 which showed 0 of 4 nodes involved. She is participating in NSABP B-49, arm one, currently  day 7 cycle 2/4 planned adjuvant dose dense Adriamycin/Cytoxan.  Interim History:   Emily Knight is seen today for followup after her 2/4 planned adjuvant dose dense Adriamycin/Cytoxan per NSABP B-49 arm 1.  She is feeling well, denying any unexplained fevers, chills, or night sweats. No nausea, emesis issues, and no diarrhea or constipation issues. She's had a bit of heartburn, but Pepcid AC has helped nicely. She denies fevers, chills, or night sweats. She does have some easy fatigability but is not affecting radiologist. She denies any diffuse bone pain, and is utilizing Claritin effectively. No bleeding or bruising symptoms.  A detailed review of systems is otherwise noncontributory as noted below.  Review of Systems: Constitutional:  no weight loss, fever, night sweats and feels well Eyes: uses glasses ENT: no complaints Cardiovascular: no chest pain or dyspnea on exertion Respiratory: no cough, shortness of breath, or wheezing Neurological: no TIA or stroke symptoms Dermatological: negative Gastrointestinal: no abdominal pain, change in bowel habits, or black or bloody stools Genito-Urinary: no dysuria, trouble voiding, or hematuria Hematological and Lymphatic: negative Breast:  negative for breast lumps Musculoskeletal: negative Remaining ROS negative.   Medications:   I have reviewed the patient's current medications.  Current Outpatient Prescriptions  Medication Sig Dispense Refill  . lidocaine-prilocaine (EMLA) cream Apply 1 application topically as needed.      Marland Kitchen oxyCODONE-acetaminophen (PERCOCET) 5-325 MG per tablet Take 1 tablet by mouth every 6 (six) hours as needed.      . prochlorperazine (COMPAZINE) 10 MG tablet Take 10 mg by mouth every 6 (six) hours as needed. 1 tab every 6 hours as needed for breakthrough nausea      . DISCONTD: prochlorperazine (COMPAZINE) 10 MG tablet Take 1 tablet (10 mg total) by mouth every 6 (six) hours as needed. 1 tab every 6 hours as needed for breakthrough nausea  30 tablet  2   No current facility-administered medications for this visit.   Facility-Administered Medications Ordered in Other Visits  Medication Dose Route Frequency Provider Last Rate Last Dose  . lidocaine-prilocaine (EMLA) cream   Topical Once Pierce Crane, MD        Allergies:  Allergies  Allergen Reactions  . Adhesive (Tape) Other (See Comments)    BLISTERS  . Codeine Nausea And Vomiting    Physical Exam: Filed Vitals:   10/09/11 1052  BP: 130/87  Pulse: 72  Temp: 97.9 F (36.6 C)    Body mass index is 29.51 kg/(m^2). Weight: 188 lbs. HEENT:  Sclerae anicteric, conjunctivae pink.  Oropharynx clear, no evidence of oral mucositis or candidiasis. Nodes:  No cervical, supraclavicular, or axillary lymphadenopathy palpated.  Breast Exam:  Deferred.   Lungs:  Clear to auscultation bilaterally.  No crackles, rhonchi, or wheezes.   Heart:  Regular rate and rhythm.  Abdomen:  Soft, nontender.  Positive bowel sounds.  No organomegaly or masses palpated.   Musculoskeletal:  No focal spinal tenderness to palpation.  Extremities:  Benign.  No peripheral edema or cyanosis.   Skin:  Benign.   Neuro:  Nonfocal, alert and oriented x 3.   Lab  Results: Lab Results  Component Value Date   WBC 2.5* 10/09/2011   HGB 12.8 10/09/2011   HCT 35.6 10/09/2011   MCV 87.3 10/09/2011   PLT 227 10/09/2011   NEUTROABS 1.4* 10/09/2011     Chemistry      Component Value Date/Time   NA 133* 10/02/2011 1027   K 4.2 10/02/2011 1027   CL 100 10/02/2011 1027   CO2 25 10/02/2011 1027   BUN 14 10/02/2011 1027   CREATININE 0.80 10/02/2011 1027      Component Value Date/Time   CALCIUM 9.4 10/02/2011 1027   ALKPHOS 80 10/02/2011 1027   AST 19 10/02/2011 1027   ALT 21 10/02/2011 1027   BILITOT 0.3 10/02/2011 1027      Lab Results  Component Value Date   LABCA2 27 09/05/2011    Radiological Studies: Nm Pet Image Initial (pi) Skull Base To Thigh 09/04/2011  *RADIOLOGY REPORT*  Clinical Data:  Initial treatment strategy for left breast cancer.  NUCLEAR MEDICINE PET CT INITIAL (PI) SKULL BASE TO THIGH  Technique:  18.9 mCi F-18 FDG was injected intravenously via the right antecubital fossa.  Full-ring PET imaging was performed from the skull base through the mid-thighs 65  minutes after injection. CT data was obtained and used for attenuation correction and anatomic localization only.  (This was not acquired as a diagnostic CT examination.)  Fasting Blood Glucose:  95  Patient Weight:  186 pounds.  Comparison:  None.  Findings: Postsurgical changes/seroma in the upper outer left breast.  Associated vague hypermetabolism, max SUV 3.5, favored to reflect postsurgical change.  Postsurgical changes in the left axilla.  Indwelling surgical drain.  Associated hypermetabolism with max SUV 4.1, also favored to be postsurgical.  No suspicious hypermetabolic foci to suggest distant metastases.  No suspicious cervical lymphadenopathy.  3 mm left upper lobe nodule (series 2/image 83).  Mild patchy opacity in the right middle lobe and the lingula.  No pleural effusion or pneumothorax.  Right chest port.  Atherosclerotic calcifications of the aorta and branch vessels.  No evidence  of bowel obstruction.  Normal appendix.  Tiny bilateral fat containing inguinal hernias.  Status post hysterectomy. Bladder is mildly thick-walled but underdistended.  Scattered tiny sclerotic lesions in the thoracolumbar spine and bilateral pelvis.  IMPRESSION: Postsurgical changes/seroma in the upper outer left breast. Associated vague hypermetabolism, max SUV 3.5, favored to reflect postsurgical change.  Postsurgical changes in the left axilla with indwelling surgical drain.  Associated hypermetabolism with max SUV 4.1, also favored to be postsurgical.  No hypermetabolic foci to suggest distant metastases.  3 mm left upper lobe pulmonary nodule.  Scattered tiny sclerotic lesions in the thoracolumbar spine and bilateral pelvis.  In the absence of prior imaging to document stability, attention on follow- up is suggested.  Original Report Authenticated By: Charline Bills, M.D.     Assessment:  Emily Knight is a 56 year old [place] woman with a history of a T2, N1 C., nuclear grade 2 invasive lobular carcinoma of the left breast for which she underwent a left lumpectomy with sentinel node dissection on 08/14/2011  revealing a 2.2 cm primary lesion with 2/3 sentinal nodes involved with metastatic  disease with extracapsular extension. Tumor was noted to be ER/PR positive at 20/10% respectively, HER-2 negative, with a Ki-67 of 30%. She underwent reexcision of the left axillary lymph nodes on 08/28/2011 which showed 0 of 4 nodes involved. She is participating in NSABP B-49, arm one, currently day 7 cycle 2/4 planned adjuvant dose dense Adriamycin/Cytoxan.  2. History of grade 2 loose stools which have completely normalized, and felt to be secondary to dietary changes.  3. 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 Prophylactic Valtrex at home.   ECOG: 0  Case reviewed with Dr. Pierce Crane.    Plan:  Emily Knight will return in one week's time for a followup exam prior to  day 1 cycle 3 of 4 planned adjuvant dose dense Adriamycin/Cytoxan. She knows to contact us prior if the need should arise. Ryllie Nieland T, PA-C 10/09/11

## 2011-10-16 ENCOUNTER — Ambulatory Visit (HOSPITAL_BASED_OUTPATIENT_CLINIC_OR_DEPARTMENT_OTHER): Payer: 59 | Admitting: Physician Assistant

## 2011-10-16 ENCOUNTER — Ambulatory Visit (HOSPITAL_BASED_OUTPATIENT_CLINIC_OR_DEPARTMENT_OTHER): Payer: 59

## 2011-10-16 ENCOUNTER — Encounter: Payer: Self-pay | Admitting: *Deleted

## 2011-10-16 ENCOUNTER — Other Ambulatory Visit (HOSPITAL_BASED_OUTPATIENT_CLINIC_OR_DEPARTMENT_OTHER): Payer: 59 | Admitting: Lab

## 2011-10-16 VITALS — BP 136/89 | HR 67 | Temp 98.4°F | Ht 67.0 in | Wt 192.1 lb

## 2011-10-16 DIAGNOSIS — Z170 Estrogen receptor positive status [ER+]: Secondary | ICD-10-CM

## 2011-10-16 DIAGNOSIS — C50419 Malignant neoplasm of upper-outer quadrant of unspecified female breast: Secondary | ICD-10-CM

## 2011-10-16 DIAGNOSIS — C50919 Malignant neoplasm of unspecified site of unspecified female breast: Secondary | ICD-10-CM

## 2011-10-16 DIAGNOSIS — Z5111 Encounter for antineoplastic chemotherapy: Secondary | ICD-10-CM

## 2011-10-16 LAB — COMPREHENSIVE METABOLIC PANEL
ALT: 28 U/L (ref 0–35)
AST: 20 U/L (ref 0–37)
Albumin: 3.9 g/dL (ref 3.5–5.2)
Calcium: 9.7 mg/dL (ref 8.4–10.5)
Chloride: 101 mEq/L (ref 96–112)
Potassium: 4.5 mEq/L (ref 3.5–5.3)
Total Protein: 7.1 g/dL (ref 6.0–8.3)

## 2011-10-16 LAB — CBC WITH DIFFERENTIAL/PLATELET
BASO%: 0.9 % (ref 0.0–2.0)
EOS%: 0.1 % (ref 0.0–7.0)
MCH: 31.7 pg (ref 25.1–34.0)
MCHC: 34.6 g/dL (ref 31.5–36.0)
RDW: 13.3 % (ref 11.2–14.5)
lymph#: 1.4 10*3/uL (ref 0.9–3.3)

## 2011-10-16 LAB — LACTATE DEHYDROGENASE: LDH: 240 U/L (ref 94–250)

## 2011-10-16 MED ORDER — DEXAMETHASONE SODIUM PHOSPHATE 4 MG/ML IJ SOLN
12.0000 mg | Freq: Once | INTRAMUSCULAR | Status: AC
  Administered 2011-10-16: 12 mg via INTRAVENOUS

## 2011-10-16 MED ORDER — SODIUM CHLORIDE 0.9 % IV SOLN
150.0000 mg | Freq: Once | INTRAVENOUS | Status: AC
  Administered 2011-10-16: 150 mg via INTRAVENOUS
  Filled 2011-10-16: qty 5

## 2011-10-16 MED ORDER — SODIUM CHLORIDE 0.9 % IV SOLN
Freq: Once | INTRAVENOUS | Status: AC
  Administered 2011-10-16: 14:00:00 via INTRAVENOUS

## 2011-10-16 MED ORDER — DOXORUBICIN HCL CHEMO IV INJECTION 2 MG/ML
60.0000 mg/m2 | Freq: Once | INTRAVENOUS | Status: AC
  Administered 2011-10-16: 122 mg via INTRAVENOUS
  Filled 2011-10-16: qty 61

## 2011-10-16 MED ORDER — PALONOSETRON HCL INJECTION 0.25 MG/5ML
0.2500 mg | Freq: Once | INTRAVENOUS | Status: AC
  Administered 2011-10-16: 0.25 mg via INTRAVENOUS

## 2011-10-16 MED ORDER — SODIUM CHLORIDE 0.9 % IV SOLN
600.0000 mg/m2 | Freq: Once | INTRAVENOUS | Status: AC
  Administered 2011-10-16: 1220 mg via INTRAVENOUS
  Filled 2011-10-16: qty 61

## 2011-10-16 MED ORDER — SODIUM CHLORIDE 0.9 % IJ SOLN
10.0000 mL | INTRAMUSCULAR | Status: DC | PRN
  Administered 2011-10-16: 10 mL
  Filled 2011-10-16: qty 10

## 2011-10-16 MED ORDER — HEPARIN SOD (PORK) LOCK FLUSH 100 UNIT/ML IV SOLN
500.0000 [IU] | Freq: Once | INTRAVENOUS | Status: AC | PRN
  Administered 2011-10-16: 500 [IU]
  Filled 2011-10-16: qty 5

## 2011-10-16 NOTE — Progress Notes (Signed)
Hematology and Oncology Follow Up Visit  Emily Knight [id number] 07-06-1955 56 y.o. 09/25/11    HPI: Emily Knight is a 56 year old [place] woman with a history of a T2, N1 C., nuclear grade 2 invasive lobular carcinoma of the left breast for which she underwent a left lumpectomy with sentinel node dissection on 08/14/2011  revealing a 2.2 cm primary lesion with 2/3 sentinal nodes involved with metastatic disease with extracapsular extension. Tumor was noted to be ER/PR positive at 20/10% respectively, HER-2 negative, with a Ki-67 of 30%. She underwent reexcision of the left axillary lymph nodes on 08/28/2011 which showed 0 of 4 nodes involved. She is participating in NSABP B-49, arm one, currently for day 1 cycle 3/4 planned adjuvant dose dense Adriamycin/Cytoxan.  Interim History:   Emily Knight is seen today for followup prior to day 1 cycle 3/4 planned adjuvant dose dense Adriamycin/Cytoxan per NSABP B-49 arm 1.  She is feeling well, denying any unexplained fevers, chills, or night sweats. No nausea, emesis issues.  She denies diarrhea or constipation.  She denies a diffuse bony pain. She denies a diffuse long bone pain. She denies bleeding or bruising symptoms. Her overall energy level has recovered quite well. She is able to perform ADLs without any difficulty. A detailed review of systems is otherwise noncontributory as noted below.  Review of Systems: Constitutional:  no weight loss, fever, night sweats and feels well Eyes: uses glasses ENT: no complaints Cardiovascular: no chest pain or dyspnea on exertion Respiratory: no cough, shortness of breath, or wheezing Neurological: no TIA or stroke symptoms Dermatological: negative Gastrointestinal: no abdominal pain, change in bowel habits, or black or bloody stools Genito-Urinary: no dysuria, trouble voiding, or hematuria Hematological and Lymphatic: negative Breast: negative for breast lumps Musculoskeletal: negative Remaining  ROS negative.   Medications:   I have reviewed the patient's current medications.  Current Outpatient Prescriptions  Medication Sig Dispense Refill  . lidocaine-prilocaine (EMLA) cream Apply 1 application topically as needed.      Marland Kitchen oxyCODONE-acetaminophen (PERCOCET) 5-325 MG per tablet Take 1 tablet by mouth every 6 (six) hours as needed.      . prochlorperazine (COMPAZINE) 10 MG tablet Take 10 mg by mouth every 6 (six) hours as needed. 1 tab every 6 hours as needed for breakthrough nausea       No current facility-administered medications for this visit.   Facility-Administered Medications Ordered in Other Visits  Medication Dose Route Frequency Provider Last Rate Last Dose  . lidocaine-prilocaine (EMLA) cream   Topical Once Pierce Crane, MD        Allergies:  Allergies  Allergen Reactions  . Adhesive (Tape) Other (See Comments)    BLISTERS  . Codeine Nausea And Vomiting    Physical Exam: Filed Vitals:   10/16/11 1308  BP: 136/89  Pulse: 67  Temp: 98.4 F (36.9 C)    Body mass index is 30.09 kg/(m^2). Weight: 192 lbs. HEENT:  Sclerae anicteric, conjunctivae pink.  Oropharynx clear. There is a small healing ulcer on the far upper left buccal mucosa, minimal irritation of the gingival line.  Nodes:  No cervical, supraclavicular, or axillary lymphadenopathy palpated.  Breast Exam:  Deferred.   Lungs:  Clear to auscultation bilaterally.  No crackles, rhonchi, or wheezes.   Heart:  Regular rate and rhythm.   Abdomen:  Soft, nontender.  Positive bowel sounds.  No organomegaly or masses palpated.   Musculoskeletal:  No focal spinal tenderness to palpation.  Extremities:  Benign.  No peripheral edema or cyanosis.   Skin:  Benign.   Neuro:  Nonfocal, alert and oriented x 3.   Lab Results: Lab Results  Component Value Date   WBC 17.7* 10/16/2011   HGB 13.1 10/16/2011   HCT 37.9 10/16/2011   MCV 91.6 10/16/2011   PLT 150 10/16/2011   NEUTROABS 15.3* 10/16/2011     Chemistry       Component Value Date/Time   NA 137 10/16/2011 1236   K 4.5 10/16/2011 1236   CL 101 10/16/2011 1236   CO2 27 10/16/2011 1236   BUN 13 10/16/2011 1236   CREATININE 0.75 10/16/2011 1236      Component Value Date/Time   CALCIUM 9.7 10/16/2011 1236   ALKPHOS 111 10/16/2011 1236   AST 20 10/16/2011 1236   ALT 28 10/16/2011 1236   BILITOT 0.2* 10/16/2011 1236      Lab Results  Component Value Date   LABCA2 27 09/05/2011    Radiological Studies: Nm Pet Image Initial (pi) Skull Base To Thigh 09/04/2011  *RADIOLOGY REPORT*  Clinical Data:  Initial treatment strategy for left breast cancer.  NUCLEAR MEDICINE PET CT INITIAL (PI) SKULL BASE TO THIGH  Technique:  18.9 mCi F-18 FDG was injected intravenously via the right antecubital fossa.  Full-ring PET imaging was performed from the skull base through the mid-thighs 65  minutes after injection. CT data was obtained and used for attenuation correction and anatomic localization only.  (This was not acquired as a diagnostic CT examination.)  Fasting Blood Glucose:  95  Patient Weight:  186 pounds.  Comparison:  None.  Findings: Postsurgical changes/seroma in the upper outer left breast.  Associated vague hypermetabolism, max SUV 3.5, favored to reflect postsurgical change.  Postsurgical changes in the left axilla.  Indwelling surgical drain.  Associated hypermetabolism with max SUV 4.1, also favored to be postsurgical.  No suspicious hypermetabolic foci to suggest distant metastases.  No suspicious cervical lymphadenopathy.  3 mm left upper lobe nodule (series 2/image 83).  Mild patchy opacity in the right middle lobe and the lingula.  No pleural effusion or pneumothorax.  Right chest port.  Atherosclerotic calcifications of the aorta and branch vessels.  No evidence of bowel obstruction.  Normal appendix.  Tiny bilateral fat containing inguinal hernias.  Status post hysterectomy. Bladder is mildly thick-walled but underdistended.  Scattered tiny sclerotic  lesions in the thoracolumbar spine and bilateral pelvis.  IMPRESSION: Postsurgical changes/seroma in the upper outer left breast. Associated vague hypermetabolism, max SUV 3.5, favored to reflect postsurgical change.  Postsurgical changes in the left axilla with indwelling surgical drain.  Associated hypermetabolism with max SUV 4.1, also favored to be postsurgical.  No hypermetabolic foci to suggest distant metastases.  3 mm left upper lobe pulmonary nodule.  Scattered tiny sclerotic lesions in the thoracolumbar spine and bilateral pelvis.  In the absence of prior imaging to document stability, attention on follow- up is suggested.  Original Report Authenticated By: Charline Bills, M.D.     Assessment:  Ms. Jagodzinski is a 56 year old [place] woman with a history of a T2, N1 C., nuclear grade 2 invasive lobular carcinoma of the left breast for which she underwent a left lumpectomy with sentinel node dissection on 08/14/2011  revealing a 2.2 cm primary lesion with 2/3 sentinal nodes involved with metastatic disease with extracapsular extension. Tumor was noted to be ER/PR positive at 20/10% respectively, HER-2 negative, with a Ki-67 of 30%. She underwent reexcision of the left axillary lymph  nodes on 08/28/2011 which showed 0 of 4 nodes involved. She is participating in NSABP B-49, arm one, currently for day 1 cycle 3/4 planned adjuvant dose dense Adriamycin/Cytoxan. 2. History of grade 2 loose stools which have completely normalized, and felt to be secondary to dietary changes. 3. 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 Prophylactic Valtrex at home.   ECOG: 0  Case reviewed with Dr. Pierce Crane.    Plan:  Amerika will receive treatment today as scheduled. She will then return on day 2 for Neulasta providing granulocytes support. I will see her in one week's time for nadir assessment. She will contact us in the interim if the need should arise.  This plan was  reviewed with the patient, who voices understanding and agreement.  She knows to call with any changes or problems.    Hayzlee Mcsorley T, PA-C 09/25/11

## 2011-10-16 NOTE — Progress Notes (Signed)
Patient in to clinic today for evaluation prior to receiving chemo cycle 3. Based on lab results review and physical exam by Debbora Presto PA, patient condition is acceptable for treatment today. Patient discussed possibility of shifting treatment to a different day of the week when Taxol begins; caregiver, Dyke Brackett, will check work schedule and notify research nurse, so that we can work to accommodate this request within the confines of the research study. Neulasta injection appointment for Cycle 3, Day 2, updated based on today's treatment time. Sign for infusion given to infusion nurses, Lebron Quam and Tommy Medal.

## 2011-10-17 ENCOUNTER — Ambulatory Visit: Payer: 59

## 2011-10-17 ENCOUNTER — Ambulatory Visit (HOSPITAL_BASED_OUTPATIENT_CLINIC_OR_DEPARTMENT_OTHER): Payer: 59

## 2011-10-17 VITALS — BP 116/70 | Temp 98.4°F

## 2011-10-17 DIAGNOSIS — C50919 Malignant neoplasm of unspecified site of unspecified female breast: Secondary | ICD-10-CM

## 2011-10-17 DIAGNOSIS — Z5189 Encounter for other specified aftercare: Secondary | ICD-10-CM

## 2011-10-17 DIAGNOSIS — C50419 Malignant neoplasm of upper-outer quadrant of unspecified female breast: Secondary | ICD-10-CM

## 2011-10-17 MED ORDER — PEGFILGRASTIM INJECTION 6 MG/0.6ML
6.0000 mg | Freq: Once | SUBCUTANEOUS | Status: AC
  Administered 2011-10-17: 6 mg via SUBCUTANEOUS
  Filled 2011-10-17: qty 0.6

## 2011-10-18 ENCOUNTER — Other Ambulatory Visit: Payer: Self-pay | Admitting: *Deleted

## 2011-10-18 DIAGNOSIS — C50419 Malignant neoplasm of upper-outer quadrant of unspecified female breast: Secondary | ICD-10-CM

## 2011-10-19 ENCOUNTER — Telehealth: Payer: Self-pay | Admitting: *Deleted

## 2011-10-19 NOTE — Telephone Encounter (Signed)
gave patient appointments for 11-13-2011 and 11-20-2011 printed out calendar and gave to the patient

## 2011-10-19 NOTE — Telephone Encounter (Signed)
Per staff message from [place], I have scheduled treatment appts for the patient. Jacki Cones aware appts in computer.  JMW

## 2011-10-19 NOTE — Telephone Encounter (Signed)
gave patient appointments for 11-13-2011 and 11-20-2011 printed out calendar and gave to the patient  

## 2011-10-22 ENCOUNTER — Other Ambulatory Visit: Payer: Self-pay | Admitting: Certified Registered Nurse Anesthetist

## 2011-10-23 ENCOUNTER — Telehealth: Payer: Self-pay | Admitting: *Deleted

## 2011-10-23 ENCOUNTER — Ambulatory Visit (HOSPITAL_BASED_OUTPATIENT_CLINIC_OR_DEPARTMENT_OTHER): Payer: 59 | Admitting: Physician Assistant

## 2011-10-23 ENCOUNTER — Ambulatory Visit: Payer: 59

## 2011-10-23 ENCOUNTER — Ambulatory Visit: Payer: 59 | Admitting: Physician Assistant

## 2011-10-23 ENCOUNTER — Other Ambulatory Visit (HOSPITAL_BASED_OUTPATIENT_CLINIC_OR_DEPARTMENT_OTHER): Payer: 59 | Admitting: Lab

## 2011-10-23 VITALS — BP 131/85 | HR 92 | Temp 97.4°F | Ht 67.0 in | Wt 190.0 lb

## 2011-10-23 DIAGNOSIS — C50419 Malignant neoplasm of upper-outer quadrant of unspecified female breast: Secondary | ICD-10-CM

## 2011-10-23 DIAGNOSIS — Z170 Estrogen receptor positive status [ER+]: Secondary | ICD-10-CM

## 2011-10-23 DIAGNOSIS — C50919 Malignant neoplasm of unspecified site of unspecified female breast: Secondary | ICD-10-CM

## 2011-10-23 DIAGNOSIS — C50912 Malignant neoplasm of unspecified site of left female breast: Secondary | ICD-10-CM

## 2011-10-23 LAB — CBC WITH DIFFERENTIAL/PLATELET
Basophils Absolute: 0 10*3/uL (ref 0.0–0.1)
EOS%: 0.5 % (ref 0.0–7.0)
HCT: 34.5 % — ABNORMAL LOW (ref 34.8–46.6)
HGB: 12.1 g/dL (ref 11.6–15.9)
LYMPH%: 16.4 % (ref 14.0–49.7)
MCH: 32.2 pg (ref 25.1–34.0)
MONO#: 0.3 10*3/uL (ref 0.1–0.9)
NEUT%: 72.4 % (ref 38.4–76.8)
Platelets: 208 10*3/uL (ref 145–400)
lymph#: 0.5 10*3/uL — ABNORMAL LOW (ref 0.9–3.3)

## 2011-10-23 NOTE — Progress Notes (Signed)
Hematology and Oncology Follow Up Visit  HUDA PETREY [id number] 1955-08-19 56 y.o. 10/09/11   HPI: Ms. Suttles is a 56 year old [place] woman with a history of a T2, N1 C., nuclear grade 2 invasive lobular carcinoma of the left breast for which she underwent a left lumpectomy with sentinel node dissection on 08/14/2011  revealing a 2.2 cm primary lesion with 2/3 sentinal nodes involved with metastatic disease with extracapsular extension. Tumor was noted to be ER/PR positive at 20/10% respectively, HER-2 negative, with a Ki-67 of 30%. She underwent reexcision of the left axillary lymph nodes on 08/28/2011 which showed 0 of 4 nodes involved. She is participating in NSABP B-49, arm one, currently  day 7 cycle 3/4 planned adjuvant dose dense Adriamycin/Cytoxan.  Interim History:   Kolina is seen today for followup after her 3/4 planned adjuvant dose dense Adriamycin/Cytoxan per NSABP B-49 arm 1.  She is feeling well, denying any unexplained fevers, chills, or night sweats. No nausea, emesis issues, and no diarrhea or constipation issues. She's had a bit of heartburn, but again Pepcid AC has helped nicely. She denies fevers, chills, or night sweats. She does have some easy fatigability but is not affecting radiologist. She denies any diffuse bone pain, and is utilizing Claritin effectively. No bleeding or bruising symptoms.  A detailed review of systems is otherwise noncontributory as noted below.  Review of Systems: Constitutional:  no weight loss, fever, night sweats and feels well Eyes: uses glasses ENT: no complaints Cardiovascular: no chest pain or dyspnea on exertion Respiratory: no cough, shortness of breath, or wheezing Neurological: no TIA or stroke symptoms Dermatological: negative Gastrointestinal: no abdominal pain, change in bowel habits, or black or bloody stools Genito-Urinary: no dysuria, trouble voiding, or hematuria Hematological and Lymphatic:  negative Breast: negative for breast lumps Musculoskeletal: negative Remaining ROS negative.   Medications:   I have reviewed the patient's current medications.  Current Outpatient Prescriptions  Medication Sig Dispense Refill  . lidocaine-prilocaine (EMLA) cream Apply 1 application topically as needed.      Marland Kitchen oxyCODONE-acetaminophen (PERCOCET) 5-325 MG per tablet Take 1 tablet by mouth every 6 (six) hours as needed.      . prochlorperazine (COMPAZINE) 10 MG tablet Take 10 mg by mouth every 6 (six) hours as needed. 1 tab every 6 hours as needed for breakthrough nausea       No current facility-administered medications for this visit.   Facility-Administered Medications Ordered in Other Visits  Medication Dose Route Frequency Provider Last Rate Last Dose  . lidocaine-prilocaine (EMLA) cream   Topical Once Pierce Crane, MD        Allergies:  Allergies  Allergen Reactions  . Adhesive (Tape) Other (See Comments)    BLISTERS  . Codeine Nausea And Vomiting    Physical Exam: Filed Vitals:   10/23/11 1303  BP: 131/85  Pulse: 92  Temp: 97.4 F (36.3 C)    Body mass index is 29.76 kg/(m^2). Weight: 190 lbs. HEENT:  Sclerae anicteric, conjunctivae pink.  Oropharynx clear, no evidence of oral mucositis or candidiasis. Nodes:  No cervical, supraclavicular, or axillary lymphadenopathy palpated.  Breast Exam:  Deferred.   Lungs:  Clear to auscultation bilaterally.  No crackles, rhonchi, or wheezes.   Heart:  Regular rate and rhythm.   Abdomen:  Soft, nontender.  Positive bowel sounds.  No organomegaly or masses palpated.   Musculoskeletal:  No focal spinal tenderness to palpation.  Extremities:  Benign.  No peripheral edema or cyanosis.  Skin:  Benign.   Neuro:  Nonfocal, alert and oriented x 3.   Lab Results: Lab Results  Component Value Date   WBC 3.1* 10/23/2011   HGB 12.1 10/23/2011   HCT 34.5* 10/23/2011   MCV 91.5 10/23/2011   PLT 208 10/23/2011   NEUTROABS 2.2 10/23/2011      Chemistry      Component Value Date/Time   NA 137 10/16/2011 1236   K 4.5 10/16/2011 1236   CL 101 10/16/2011 1236   CO2 27 10/16/2011 1236   BUN 13 10/16/2011 1236   CREATININE 0.75 10/16/2011 1236      Component Value Date/Time   CALCIUM 9.7 10/16/2011 1236   ALKPHOS 111 10/16/2011 1236   AST 20 10/16/2011 1236   ALT 28 10/16/2011 1236   BILITOT 0.2* 10/16/2011 1236      Lab Results  Component Value Date   LABCA2 27 09/05/2011    Radiological Studies: Nm Pet Image Initial (pi) Skull Base To Thigh 09/04/2011  *RADIOLOGY REPORT*  Clinical Data:  Initial treatment strategy for left breast cancer.  NUCLEAR MEDICINE PET CT INITIAL (PI) SKULL BASE TO THIGH  Technique:  18.9 mCi F-18 FDG was injected intravenously via the right antecubital fossa.  Full-ring PET imaging was performed from the skull base through the mid-thighs 65  minutes after injection. CT data was obtained and used for attenuation correction and anatomic localization only.  (This was not acquired as a diagnostic CT examination.)  Fasting Blood Glucose:  95  Patient Weight:  186 pounds.  Comparison:  None.  Findings: Postsurgical changes/seroma in the upper outer left breast.  Associated vague hypermetabolism, max SUV 3.5, favored to reflect postsurgical change.  Postsurgical changes in the left axilla.  Indwelling surgical drain.  Associated hypermetabolism with max SUV 4.1, also favored to be postsurgical.  No suspicious hypermetabolic foci to suggest distant metastases.  No suspicious cervical lymphadenopathy.  3 mm left upper lobe nodule (series 2/image 83).  Mild patchy opacity in the right middle lobe and the lingula.  No pleural effusion or pneumothorax.  Right chest port.  Atherosclerotic calcifications of the aorta and branch vessels.  No evidence of bowel obstruction.  Normal appendix.  Tiny bilateral fat containing inguinal hernias.  Status post hysterectomy. Bladder is mildly thick-walled but underdistended.  Scattered tiny  sclerotic lesions in the thoracolumbar spine and bilateral pelvis.  IMPRESSION: Postsurgical changes/seroma in the upper outer left breast. Associated vague hypermetabolism, max SUV 3.5, favored to reflect postsurgical change.  Postsurgical changes in the left axilla with indwelling surgical drain.  Associated hypermetabolism with max SUV 4.1, also favored to be postsurgical.  No hypermetabolic foci to suggest distant metastases.  3 mm left upper lobe pulmonary nodule.  Scattered tiny sclerotic lesions in the thoracolumbar spine and bilateral pelvis.  In the absence of prior imaging to document stability, attention on follow- up is suggested.  Original Report Authenticated By: Charline Bills, M.D.     Assessment:  Ms. Grimley is a 56 year old [place] woman with a history of a T2, N1 C., nuclear grade 2 invasive lobular carcinoma of the left breast for which she underwent a left lumpectomy with sentinel node dissection on 08/14/2011  revealing a 2.2 cm primary lesion with 2/3 sentinal nodes involved with metastatic disease with extracapsular extension. Tumor was noted to be ER/PR positive at 20/10% respectively, HER-2 negative, with a Ki-67 of 30%. She underwent reexcision of the left axillary lymph nodes on 08/28/2011 which showed 0 of  4 nodes involved. She is participating in NSABP B-49, arm one, currently day 7 cycle 3/4 planned adjuvant dose dense Adriamycin/Cytoxan.  2. History of grade 2 loose stools which have completely normalized, and felt to be secondary to dietary changes.  3. 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 Prophylactic Valtrex at home.   ECOG: 0  Case to be reviewed with Dr. Pierce Crane.    Plan:  Korrine will return in one week's time for a followup exam prior to day 1 cycle 4 of 4 planned adjuvant dose dense Adriamycin/Cytoxan. She knows to contact us prior if the need should arise. Tristan Bramble T, PA-C 10/23/11

## 2011-10-23 NOTE — Telephone Encounter (Signed)
patient getting labs drawn on 10-23-2011 and see christine on 10-23-2016 at 9:45am 

## 2011-10-23 NOTE — Telephone Encounter (Signed)
patient getting labs drawn on 10-23-2011 and see Emily Knight on 10-23-2016 at 9:45am

## 2011-10-24 ENCOUNTER — Ambulatory Visit: Payer: 59 | Admitting: Physician Assistant

## 2011-10-24 ENCOUNTER — Other Ambulatory Visit: Payer: Self-pay | Admitting: Oncology

## 2011-10-29 ENCOUNTER — Other Ambulatory Visit: Payer: Self-pay | Admitting: Physician Assistant

## 2011-10-30 ENCOUNTER — Ambulatory Visit (HOSPITAL_BASED_OUTPATIENT_CLINIC_OR_DEPARTMENT_OTHER): Payer: 59

## 2011-10-30 ENCOUNTER — Other Ambulatory Visit (HOSPITAL_BASED_OUTPATIENT_CLINIC_OR_DEPARTMENT_OTHER): Payer: 59 | Admitting: Lab

## 2011-10-30 ENCOUNTER — Telehealth: Payer: Self-pay | Admitting: Oncology

## 2011-10-30 ENCOUNTER — Ambulatory Visit (HOSPITAL_BASED_OUTPATIENT_CLINIC_OR_DEPARTMENT_OTHER): Payer: 59 | Admitting: Physician Assistant

## 2011-10-30 ENCOUNTER — Encounter: Payer: Self-pay | Admitting: *Deleted

## 2011-10-30 VITALS — BP 147/89 | HR 76 | Temp 97.8°F | Ht 67.0 in | Wt 191.6 lb

## 2011-10-30 DIAGNOSIS — C50419 Malignant neoplasm of upper-outer quadrant of unspecified female breast: Secondary | ICD-10-CM

## 2011-10-30 DIAGNOSIS — C50919 Malignant neoplasm of unspecified site of unspecified female breast: Secondary | ICD-10-CM

## 2011-10-30 DIAGNOSIS — Z5111 Encounter for antineoplastic chemotherapy: Secondary | ICD-10-CM

## 2011-10-30 DIAGNOSIS — R109 Unspecified abdominal pain: Secondary | ICD-10-CM

## 2011-10-30 DIAGNOSIS — Z170 Estrogen receptor positive status [ER+]: Secondary | ICD-10-CM

## 2011-10-30 LAB — CBC WITH DIFFERENTIAL/PLATELET
Basophils Absolute: 0 10*3/uL (ref 0.0–0.1)
EOS%: 0.1 % (ref 0.0–7.0)
Eosinophils Absolute: 0 10*3/uL (ref 0.0–0.5)
HGB: 12.6 g/dL (ref 11.6–15.9)
LYMPH%: 6.1 % — ABNORMAL LOW (ref 14.0–49.7)
MCH: 32 pg (ref 25.1–34.0)
MCV: 92.1 fL (ref 79.5–101.0)
MONO%: 5.5 % (ref 0.0–14.0)
NEUT#: 16.8 10*3/uL — ABNORMAL HIGH (ref 1.5–6.5)
Platelets: 199 10*3/uL (ref 145–400)

## 2011-10-30 LAB — COMPREHENSIVE METABOLIC PANEL
BUN: 14 mg/dL (ref 6–23)
CO2: 25 mEq/L (ref 19–32)
Creatinine, Ser: 0.75 mg/dL (ref 0.50–1.10)
Glucose, Bld: 102 mg/dL — ABNORMAL HIGH (ref 70–99)
Total Bilirubin: 0.3 mg/dL (ref 0.3–1.2)
Total Protein: 7.3 g/dL (ref 6.0–8.3)

## 2011-10-30 MED ORDER — DEXAMETHASONE SODIUM PHOSPHATE 4 MG/ML IJ SOLN
12.0000 mg | Freq: Once | INTRAMUSCULAR | Status: AC
  Administered 2011-10-30: 12 mg via INTRAVENOUS

## 2011-10-30 MED ORDER — SODIUM CHLORIDE 0.9 % IJ SOLN
10.0000 mL | INTRAMUSCULAR | Status: DC | PRN
  Administered 2011-10-30: 10 mL
  Filled 2011-10-30: qty 10

## 2011-10-30 MED ORDER — DOXORUBICIN HCL CHEMO IV INJECTION 2 MG/ML
60.0000 mg/m2 | Freq: Once | INTRAVENOUS | Status: AC
  Administered 2011-10-30: 122 mg via INTRAVENOUS
  Filled 2011-10-30: qty 61

## 2011-10-30 MED ORDER — PALONOSETRON HCL INJECTION 0.25 MG/5ML
0.2500 mg | Freq: Once | INTRAVENOUS | Status: AC
  Administered 2011-10-30: 0.25 mg via INTRAVENOUS

## 2011-10-30 MED ORDER — SODIUM CHLORIDE 0.9 % IV SOLN
600.0000 mg/m2 | Freq: Once | INTRAVENOUS | Status: AC
  Administered 2011-10-30: 1220 mg via INTRAVENOUS
  Filled 2011-10-30: qty 61

## 2011-10-30 MED ORDER — SODIUM CHLORIDE 0.9 % IV SOLN
Freq: Once | INTRAVENOUS | Status: AC
  Administered 2011-10-30: 12:00:00 via INTRAVENOUS

## 2011-10-30 MED ORDER — SODIUM CHLORIDE 0.9 % IV SOLN
150.0000 mg | Freq: Once | INTRAVENOUS | Status: AC
  Administered 2011-10-30: 150 mg via INTRAVENOUS
  Filled 2011-10-30: qty 5

## 2011-10-30 MED ORDER — HEPARIN SOD (PORK) LOCK FLUSH 100 UNIT/ML IV SOLN
250.0000 [IU] | Freq: Once | INTRAVENOUS | Status: DC | PRN
  Filled 2011-10-30: qty 5

## 2011-10-30 MED ORDER — HEPARIN SOD (PORK) LOCK FLUSH 100 UNIT/ML IV SOLN
500.0000 [IU] | Freq: Once | INTRAVENOUS | Status: AC | PRN
  Administered 2011-10-30: 500 [IU]
  Filled 2011-10-30: qty 5

## 2011-10-30 NOTE — Telephone Encounter (Signed)
gve the pt her may 2013 appt calendar along with the US gallbladder appt

## 2011-10-30 NOTE — Progress Notes (Signed)
Hematology and Oncology Follow Up Visit  Emily Knight [id number] [birth year] 56 y.o. 10/30/11   HPI: Emily Knight is a 57 year old [place] woman with a history of a T2, N1 C., nuclear grade 2 invasive lobular carcinoma of the left breast for which she underwent a left lumpectomy with sentinel node dissection on 08/14/2011  revealing a 2.2 cm primary lesion with 2/3 sentinal nodes involved with metastatic disease with extracapsular extension. Tumor was noted to be ER/PR positive at 20/10% respectively, HER-2 negative, with a Ki-67 of 30%. She underwent reexcision of the left axillary lymph nodes on 08/28/2011 which showed 0 of 4 nodes involved. She is participating in NSABP B-49, arm one, currently for day 1 cycle 4/4 planned adjuvant dose dense Adriamycin/Cytoxan.  Interim History:   Emily Knight is seen today for followup prior to day 1 cycle 4/4 planned adjuvant dose dense Adriamycin/Cytoxan per NSABP B-49 arm 1.  She is feeling well, denying any unexplained fevers, chills, or night sweats. No nausea, emesis issues.  She denies diarrhea or constipation. She has experienced occasional discomfort in the right upper quadrant of the abdomen, sometimes in the left, and does seem to correlate with eating certain foods, her gallbladder still in place. No family history of gallbladder disease. She denies a diffuse bony pain. She denies a diffuse long bone pain. She denies bleeding or bruising symptoms. Her overall energy level has recovered quite well. She is able to perform ADLs without any difficulty. A detailed review of systems is otherwise noncontributory as noted below.  Review of Systems: Constitutional:  no weight loss, fever, night sweats and feels well Eyes: uses glasses ENT: no complaints Cardiovascular: no chest pain or dyspnea on exertion Respiratory: no cough, shortness of breath, or wheezing Neurological: no TIA or stroke symptoms Dermatological: negative Gastrointestinal: no  abdominal pain, change in bowel habits, or black or bloody stools Genito-Urinary: no dysuria, trouble voiding, or hematuria Hematological and Lymphatic: negative Breast: negative for breast lumps Musculoskeletal: negative Remaining ROS negative.   Medications:   I have reviewed the patient's current medications.  Current Outpatient Prescriptions  Medication Sig Dispense Refill  . lidocaine-prilocaine (EMLA) cream Apply 1 application topically as needed.      Marland Kitchen oxyCODONE-acetaminophen (PERCOCET) 5-325 MG per tablet Take 1 tablet by mouth every 6 (six) hours as needed.      . prochlorperazine (COMPAZINE) 10 MG tablet Take 10 mg by mouth every 6 (six) hours as needed. 1 tab every 6 hours as needed for breakthrough nausea       No current facility-administered medications for this visit.   Facility-Administered Medications Ordered in Other Visits  Medication Dose Route Frequency Provider Last Rate Last Dose  . lidocaine-prilocaine (EMLA) cream   Topical Once Pierce Crane, MD        Allergies:  Allergies  Allergen Reactions  . Adhesive (Tape) Other (See Comments)    BLISTERS  . Codeine Nausea And Vomiting    Physical Exam: Filed Vitals:   10/30/11 1028  BP: 147/89  Pulse: 76  Temp: 97.8 F (36.6 C)    Body mass index is 30.01 kg/(m^2). Weight: 191 lbs. HEENT:  Sclerae anicteric, conjunctivae pink.  Oropharynx clear.  Nodes:  No cervical, supraclavicular, or axillary lymphadenopathy palpated.  Breast Exam:  Deferred.   Lungs:  Clear to auscultation bilaterally.  No crackles, rhonchi, or wheezes.   Heart:  Regular rate and rhythm.   Abdomen:  Soft, nontender.  Positive bowel sounds.  No organomegaly or masses  palpated.  No rebound tenderness. Musculoskeletal:  No focal spinal tenderness to palpation.  Extremities:  Benign.  No peripheral edema or cyanosis.   Skin:  Benign.   Neuro:  Nonfocal, alert and oriented x 3.   Lab Results: Lab Results  Component Value Date    WBC 19.1* 10/30/2011   HGB 12.6 10/30/2011   HCT 36.3 10/30/2011   MCV 92.1 10/30/2011   PLT 199 10/30/2011   NEUTROABS 16.8* 10/30/2011     Chemistry      Component Value Date/Time   NA 136 10/30/2011 1005   K 4.1 10/30/2011 1005   CL 101 10/30/2011 1005   CO2 25 10/30/2011 1005   BUN 14 10/30/2011 1005   CREATININE 0.75 10/30/2011 1005      Component Value Date/Time   CALCIUM 9.3 10/30/2011 1005   ALKPHOS 122* 10/30/2011 1005   AST 23 10/30/2011 1005   ALT 32 10/30/2011 1005   BILITOT 0.3 10/30/2011 1005      Lab Results  Component Value Date   LABCA2 27 09/05/2011    Radiological Studies: Nm Pet Image Initial (pi) Skull Base To Thigh 09/04/2011  *RADIOLOGY REPORT*  Clinical Data:  Initial treatment strategy for left breast cancer.  NUCLEAR MEDICINE PET CT INITIAL (PI) SKULL BASE TO THIGH  Technique:  18.9 mCi F-18 FDG was injected intravenously via the right antecubital fossa.  Full-ring PET imaging was performed from the skull base through the mid-thighs 65  minutes after injection. CT data was obtained and used for attenuation correction and anatomic localization only.  (This was not acquired as a diagnostic CT examination.)  Fasting Blood Glucose:  95  Patient Weight:  186 pounds.  Comparison:  None.  Findings: Postsurgical changes/seroma in the upper outer left breast.  Associated vague hypermetabolism, max SUV 3.5, favored to reflect postsurgical change.  Postsurgical changes in the left axilla.  Indwelling surgical drain.  Associated hypermetabolism with max SUV 4.1, also favored to be postsurgical.  No suspicious hypermetabolic foci to suggest distant metastases.  No suspicious cervical lymphadenopathy.  3 mm left upper lobe nodule (series 2/image 83).  Mild patchy opacity in the right middle lobe and the lingula.  No pleural effusion or pneumothorax.  Right chest port.  Atherosclerotic calcifications of the aorta and branch vessels.  No evidence of bowel obstruction.  Normal appendix.  Tiny  bilateral fat containing inguinal hernias.  Status post hysterectomy. Bladder is mildly thick-walled but underdistended.  Scattered tiny sclerotic lesions in the thoracolumbar spine and bilateral pelvis.  IMPRESSION: Postsurgical changes/seroma in the upper outer left breast. Associated vague hypermetabolism, max SUV 3.5, favored to reflect postsurgical change.  Postsurgical changes in the left axilla with indwelling surgical drain.  Associated hypermetabolism with max SUV 4.1, also favored to be postsurgical.  No hypermetabolic foci to suggest distant metastases.  3 mm left upper lobe pulmonary nodule.  Scattered tiny sclerotic lesions in the thoracolumbar spine and bilateral pelvis.  In the absence of prior imaging to document stability, attention on follow- up is suggested.  Original Report Authenticated By: Charline Bills, M.D.     Assessment:  Emily Knight is a 56 year old [place] woman with a history of a T2, N1 C., nuclear grade 2 invasive lobular carcinoma of the left breast for which she underwent a left lumpectomy with sentinel node dissection on 08/14/2011  revealing a 2.2 cm primary lesion with 2/3 sentinal nodes involved with metastatic disease with extracapsular extension. Tumor was noted to be ER/PR positive  at 20/10% respectively, HER-2 negative, with a Ki-67 of 30%. She underwent reexcision of the left axillary lymph nodes on 08/28/2011 which showed 0 of 4 nodes involved. She is participating in NSABP B-49, arm one, currently for day 1 cycle 4/4 planned adjuvant dose dense Adriamycin/Cytoxan.  2. History of grade 2 loose stools which have completely normalized, and felt to be secondary to dietary changes.  3. 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 Prophylactic Valtrex at home.  4. Reported history of vague intermittent upper abdominal discomfort following eating possible association with gallbladder.   ECOG: 0  Case to be reviewed with Dr.  Pierce Crane.    Plan:  Emily Knight will receive treatment today as scheduled. She will then return on day 2 for Neulasta providing granulocytes support. I will see her in one week's time for nadir assessment. In the interim, I will obtain an ultrasound of the abdomen to assess for gallbladder disease.   This plan was reviewed with the patient, who voices understanding and agreement.  She knows to call with any changes or problems.    Emily Geller T, PA-C 10/30/11

## 2011-10-30 NOTE — Progress Notes (Signed)
10/30/11- at 1:12pm- NSABP B-49 - cycle 4, day 1 (last dose dense AC)- The Emily Knight was into the cancer center this am for her cycle 4, day 1 dose dense AC.  Her labs were drawn and reviewed by Debbora Presto, PA before her treatment.  Her lab values were all acceptable and did not require any dose modifications.  The Emily Knight states she is ready for her last cycle of AC.  She reports some fatigue, but she is able to work in her yard on a regular basis.  ECOG=0.   She is aware that her last neulasta injection is scheduled for tomorrow.  She does report some moderate back pain after her last neulasta injection.  She states the Claritin has helped with this pain.  She states that her left knee has been more sore "than usual".  The Emily Knight does report falling in the yard which may have agravated her knee.  The Emily Knight has a history of left knee problems which have required surgical interventions in the past.  She also reports some mild bilateral ankle edema last week.  The Emily Knight started drinking more water, and the edema improved.   She specifically denies any mouth sores.  She said that she has had some "mouth tenderness", but she just avoided spicy foods.  The Emily Knight states that she experiences discomfort on her right side(right upper quadrant of her abdomen) after meals.  Debbora Presto, [place], felt that the pain may be related to her gallbladder.  The PA ordered an ultrasound to be done to evaluate for any gallstones.  Her weight is stable.  The PA felt that the Emily Knight was ready for her treatment.  The research nurse spoke to [place] and [place], chemo nurses, about the Emily Knight's treatment.  The infusion sign was given to [place] to post on the Emily Knight's IV pump for reference.

## 2011-10-30 NOTE — Patient Instructions (Signed)
[facility] Discharge Instructions for Patients Receiving Chemotherapy  Today you received the following chemotherapy agents Adriamycin and Cytoxan  To help prevent nausea and vomiting after your treatment, we encourage you to take your nausea medication Begin taking it at 7 pm and take it as often as prescribed for the next 24 to 72 hours.   If you develop nausea and vomiting that is not controlled by your nausea medication, call the clinic. If it is after clinic hours your family physician or the after hours number for the clinic or go to the Emergency Department.   BELOW ARE SYMPTOMS THAT SHOULD BE REPORTED IMMEDIATELY:  *FEVER GREATER THAN 100.5 F  *CHILLS WITH OR WITHOUT FEVER  NAUSEA AND VOMITING THAT IS NOT CONTROLLED WITH YOUR NAUSEA MEDICATION  *UNUSUAL SHORTNESS OF BREATH  *UNUSUAL BRUISING OR BLEEDING  TENDERNESS IN MOUTH AND THROAT WITH OR WITHOUT PRESENCE OF ULCERS  *URINARY PROBLEMS  *BOWEL PROBLEMS  UNUSUAL RASH Items with * indicate a potential emergency and should be followed up as soon as possible.  One of the nurses will contact you 24 hours after your treatment. Please let the nurse know about any problems that you may have experienced. Feel free to call the clinic you have any questions or concerns. The clinic phone number is [phone].   I have been informed and understand all the instructions given to me. I know to contact the clinic, my physician, or go to the Emergency Department if any problems should occur. I do not have any questions at this time, but understand that I may call the clinic during office hours   should I have any questions or need assistance in obtaining follow up care.    __________________________________________  _____________  __________ Signature of Patient or Authorized Representative            Date                   Time    __________________________________________ Nurse's Signature    

## 2011-10-31 ENCOUNTER — Ambulatory Visit (HOSPITAL_BASED_OUTPATIENT_CLINIC_OR_DEPARTMENT_OTHER): Payer: 59

## 2011-10-31 VITALS — BP 121/75 | HR 64 | Temp 97.1°F

## 2011-10-31 DIAGNOSIS — Z5189 Encounter for other specified aftercare: Secondary | ICD-10-CM

## 2011-10-31 DIAGNOSIS — C50419 Malignant neoplasm of upper-outer quadrant of unspecified female breast: Secondary | ICD-10-CM

## 2011-10-31 DIAGNOSIS — C50919 Malignant neoplasm of unspecified site of unspecified female breast: Secondary | ICD-10-CM

## 2011-10-31 MED ORDER — PEGFILGRASTIM INJECTION 6 MG/0.6ML
6.0000 mg | Freq: Once | SUBCUTANEOUS | Status: AC
  Administered 2011-10-31: 6 mg via SUBCUTANEOUS
  Filled 2011-10-31: qty 0.6

## 2011-11-02 ENCOUNTER — Other Ambulatory Visit: Payer: Self-pay | Admitting: Physician Assistant

## 2011-11-02 ENCOUNTER — Ambulatory Visit (HOSPITAL_COMMUNITY)
Admission: RE | Admit: 2011-11-02 | Discharge: 2011-11-02 | Disposition: A | Discharge status: Home / Self Care | Payer: 59 | Source: Ambulatory Visit | Attending: Physician Assistant | Admitting: Physician Assistant

## 2011-11-02 DIAGNOSIS — K7689 Other specified diseases of liver: Secondary | ICD-10-CM | POA: Insufficient documentation

## 2011-11-02 DIAGNOSIS — C50919 Malignant neoplasm of unspecified site of unspecified female breast: Secondary | ICD-10-CM

## 2011-11-02 DIAGNOSIS — R109 Unspecified abdominal pain: Secondary | ICD-10-CM | POA: Insufficient documentation

## 2011-11-05 ENCOUNTER — Encounter (INDEPENDENT_AMBULATORY_CARE_PROVIDER_SITE_OTHER): Payer: Self-pay

## 2011-11-06 ENCOUNTER — Ambulatory Visit (HOSPITAL_BASED_OUTPATIENT_CLINIC_OR_DEPARTMENT_OTHER): Payer: 59 | Admitting: Physician Assistant

## 2011-11-06 ENCOUNTER — Other Ambulatory Visit: Payer: Self-pay | Admitting: *Deleted

## 2011-11-06 ENCOUNTER — Encounter: Payer: Self-pay | Admitting: Physician Assistant

## 2011-11-06 ENCOUNTER — Other Ambulatory Visit (HOSPITAL_BASED_OUTPATIENT_CLINIC_OR_DEPARTMENT_OTHER): Payer: 59 | Admitting: Lab

## 2011-11-06 VITALS — BP 133/88 | HR 106 | Temp 97.0°F | Ht 67.0 in | Wt 191.5 lb

## 2011-11-06 DIAGNOSIS — C50919 Malignant neoplasm of unspecified site of unspecified female breast: Secondary | ICD-10-CM

## 2011-11-06 DIAGNOSIS — Z170 Estrogen receptor positive status [ER+]: Secondary | ICD-10-CM

## 2011-11-06 LAB — CBC WITH DIFFERENTIAL/PLATELET
Basophils Absolute: 0 10*3/uL (ref 0.0–0.1)
EOS%: 1 % (ref 0.0–7.0)
HCT: 32.8 % — ABNORMAL LOW (ref 34.8–46.6)
HGB: 11.6 g/dL (ref 11.6–15.9)
MCH: 32.4 pg (ref 25.1–34.0)
MCV: 91.7 fL (ref 79.5–101.0)
MONO%: 6.8 % (ref 0.0–14.0)
NEUT%: 78.5 % — ABNORMAL HIGH (ref 38.4–76.8)
Platelets: 268 10*3/uL (ref 145–400)

## 2011-11-06 NOTE — Progress Notes (Signed)
Hematology and Oncology Follow Up Visit  Emily Knight [id number] [birth year] 56 y.o. 11/06/11   HPI: Emily Knight is a 56 year old [place] woman with a history of a T2, N1 C., nuclear grade 2 invasive lobular carcinoma of the left breast for which she underwent a left lumpectomy with sentinel node dissection on 08/14/2011  revealing a 2.2 cm primary lesion with 2/3 sentinal nodes involved with metastatic disease with extracapsular extension. Tumor was noted to be ER/PR positive at 20/10% respectively, HER-2 negative, with a Ki-67 of 30%. She underwent reexcision of the left axillary lymph nodes on 08/28/2011 which showed 0 of 4 nodes involved. She is participating in NSABP B-49, arm one, currently  day 7 cycle 4/4 planned adjuvant dose dense Adriamycin/Cytoxan.  Interim History:   Emily Knight is seen today for followup after her 4/4 planned adjuvant dose dense Adriamycin/Cytoxan per NSABP B-49 arm 1.  She is feeling well, denying any unexplained fevers, chills, or night sweats. No nausea, emesis issues, and no diarrhea or constipation issues. She's had a bit of heartburn, and admits that she has not been taking Pepcid AC consistently.  She denies fevers, chills, or night sweats. She does have some easy fatigability but is not affecting her daily activity.. She denies any diffuse bone pain, and is utilizing Claritin effectively. No bleeding or bruising symptoms.  A detailed review of systems is otherwise noncontributory as noted below.  Review of Systems: Constitutional:  no weight loss, fever, night sweats and feels well Eyes: uses glasses ENT: no complaints Cardiovascular: no chest pain or dyspnea on exertion Respiratory: no cough, shortness of breath, or wheezing Neurological: no TIA or stroke symptoms Dermatological: negative Gastrointestinal: no abdominal pain, change in bowel habits, or black or bloody stools Genito-Urinary: no dysuria, trouble voiding, or  hematuria Hematological and Lymphatic: negative Breast: negative for breast lumps Musculoskeletal: negative Remaining ROS negative.   Medications:   I have reviewed the patient's current medications.  Current Outpatient Prescriptions  Medication Sig Dispense Refill  . lansoprazole (PREVACID) 15 MG capsule Take 15 mg by mouth 2 (two) times daily.      Marland Kitchen lidocaine-prilocaine (EMLA) cream Apply 1 application topically as needed.      Marland Kitchen oxyCODONE-acetaminophen (PERCOCET) 5-325 MG per tablet Take 1 tablet by mouth every 6 (six) hours as needed.      . prochlorperazine (COMPAZINE) 10 MG tablet Take 10 mg by mouth every 6 (six) hours as needed. 1 tab every 6 hours as needed for breakthrough nausea       No current facility-administered medications for this visit.   Facility-Administered Medications Ordered in Other Visits  Medication Dose Route Frequency Provider Last Rate Last Dose  . lidocaine-prilocaine (EMLA) cream   Topical Once Pierce Crane, MD        Allergies:  Allergies  Allergen Reactions  . Adhesive (Tape) Other (See Comments)    BLISTERS  . Codeine Nausea And Vomiting    Physical Exam: Filed Vitals:   11/06/11 1358  BP: 133/88  Pulse: 106  Temp: 97 F (36.1 C)    Body mass index is 29.99 kg/(m^2). Weight: 191 lbs. HEENT:  Sclerae anicteric, conjunctivae pink.  Oropharynx clear, no evidence of oral mucositis or candidiasis. Nodes:  No cervical, supraclavicular, or axillary lymphadenopathy palpated.  Breast Exam:  Deferred.   Lungs:  Clear to auscultation bilaterally.  No crackles, rhonchi, or wheezes.   Heart:  Regular rate and rhythm.   Abdomen:  Soft, nontender.  Positive bowel sounds.  No organomegaly or masses palpated.   Musculoskeletal:  No focal spinal tenderness to palpation.  Extremities:  Benign.  No peripheral edema or cyanosis.   Skin:  Benign.   Neuro:  Nonfocal, alert and oriented x 3.   Lab Results: Lab Results  Component Value Date   WBC 3.4*  11/06/2011   HGB 11.6 11/06/2011   HCT 32.8* 11/06/2011   MCV 91.7 11/06/2011   PLT 268 11/06/2011   NEUTROABS 2.7 11/06/2011     Chemistry      Component Value Date/Time   NA 136 10/30/2011 1005   K 4.1 10/30/2011 1005   CL 101 10/30/2011 1005   CO2 25 10/30/2011 1005   BUN 14 10/30/2011 1005   CREATININE 0.75 10/30/2011 1005      Component Value Date/Time   CALCIUM 9.3 10/30/2011 1005   ALKPHOS 122* 10/30/2011 1005   AST 23 10/30/2011 1005   ALT 32 10/30/2011 1005   BILITOT 0.3 10/30/2011 1005      Lab Results  Component Value Date   LABCA2 27 09/05/2011    Radiological Studies: 11/02/11 COMPLETE ABDOMINAL ULTRASOUND  Comparison: CT 09/04/2011  Findings:  Gallbladder: No gallstones, gallbladder wall thickening, or  pericholecystic fluid.  Common bile duct: Normal caliber, 4 mm.  Liver: Slight increased echotexture throughout the liver which may  reflect fatty infiltration. No biliary dilatation or focal  abnormality.  IVC: Appears normal.  Pancreas: No focal abnormality seen.  Spleen: Within normal limits in size and echotexture.  Right Kidney: Normal in size and parenchymal echogenicity. No  evidence of mass or hydronephrosis. Simple appearing 1.6 cm cyst in  the mid pole.  Left Kidney: Normal in size and parenchymal echogenicity. No  evidence of mass or hydronephrosis.  Abdominal aorta: No aneurysm identified.  IMPRESSION:  Suspect mild fatty infiltration of the liver. No evidence for  gallbladder disease.  Original Report Authenticated By: Cyndie Chime, M.D.     Assessment:  [name] is a 56 year old [place] woman with a history of a T2, N1 C., nuclear grade 2 invasive lobular carcinoma of the left breast for which she underwent a left lumpectomy with sentinel node dissection on 08/14/2011  revealing a 2.2 cm primary lesion with 2/3 sentinal nodes involved with metastatic disease with extracapsular extension. Tumor was noted to be ER/PR positive at  20/10% respectively, HER-2 negative, with a Ki-67 of 30%. She underwent reexcision of the left axillary lymph nodes on 08/28/2011 which showed 0 of 4 nodes involved. She is participating in NSABP B-49, arm one, currently day 7 cycle 4/4 planned adjuvant dose dense Adriamycin/Cytoxan.  2. History of grade 2 loose stools which have completely normalized, and felt to be secondary to dietary changes.  3. 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 Prophylactic Valtrex at home.   ECOG: 0  Case reviewed with Dr. Pierce Crane.    Plan:  [place] will return in one week's time for a followup exam prior to week 1/12 planned adjuvant single agent Taxol per protocol. She knows to contact us prior if the need should arise. Kallista Pae T, PA-C 11/06/11

## 2011-11-08 ENCOUNTER — Other Ambulatory Visit: Payer: 59 | Admitting: Lab

## 2011-11-08 ENCOUNTER — Ambulatory Visit: Payer: 59 | Admitting: Physician Assistant

## 2011-11-13 ENCOUNTER — Ambulatory Visit: Payer: 59 | Admitting: Oncology

## 2011-11-13 ENCOUNTER — Other Ambulatory Visit: Payer: Self-pay | Admitting: *Deleted

## 2011-11-13 ENCOUNTER — Telehealth: Payer: Self-pay | Admitting: *Deleted

## 2011-11-13 ENCOUNTER — Other Ambulatory Visit (HOSPITAL_BASED_OUTPATIENT_CLINIC_OR_DEPARTMENT_OTHER): Payer: 59 | Admitting: Lab

## 2011-11-13 ENCOUNTER — Other Ambulatory Visit: Payer: 59 | Admitting: Lab

## 2011-11-13 ENCOUNTER — Ambulatory Visit (HOSPITAL_BASED_OUTPATIENT_CLINIC_OR_DEPARTMENT_OTHER): Payer: 59

## 2011-11-13 ENCOUNTER — Ambulatory Visit (HOSPITAL_BASED_OUTPATIENT_CLINIC_OR_DEPARTMENT_OTHER): Payer: 59 | Admitting: Oncology

## 2011-11-13 ENCOUNTER — Encounter: Payer: 59 | Admitting: *Deleted

## 2011-11-13 VITALS — BP 138/95 | HR 99 | Temp 98.2°F | Ht 67.0 in | Wt 189.8 lb

## 2011-11-13 VITALS — BP 121/75 | HR 70 | Temp 98.6°F

## 2011-11-13 DIAGNOSIS — Z170 Estrogen receptor positive status [ER+]: Secondary | ICD-10-CM

## 2011-11-13 DIAGNOSIS — C50419 Malignant neoplasm of upper-outer quadrant of unspecified female breast: Secondary | ICD-10-CM

## 2011-11-13 DIAGNOSIS — C50919 Malignant neoplasm of unspecified site of unspecified female breast: Secondary | ICD-10-CM

## 2011-11-13 DIAGNOSIS — Z5111 Encounter for antineoplastic chemotherapy: Secondary | ICD-10-CM

## 2011-11-13 LAB — COMPREHENSIVE METABOLIC PANEL
Albumin: 4.3 g/dL (ref 3.5–5.2)
Alkaline Phosphatase: 130 U/L — ABNORMAL HIGH (ref 39–117)
BUN: 13 mg/dL (ref 6–23)
Glucose, Bld: 104 mg/dL — ABNORMAL HIGH (ref 70–99)
Total Bilirubin: 0.4 mg/dL (ref 0.3–1.2)

## 2011-11-13 LAB — CBC WITH DIFFERENTIAL/PLATELET
Basophils Absolute: 0.1 10*3/uL (ref 0.0–0.1)
Eosinophils Absolute: 0 10*3/uL (ref 0.0–0.5)
HCT: 35.5 % (ref 34.8–46.6)
HGB: 12.2 g/dL (ref 11.6–15.9)
LYMPH%: 4.6 % — ABNORMAL LOW (ref 14.0–49.7)
MONO#: 1.4 10*3/uL — ABNORMAL HIGH (ref 0.1–0.9)
NEUT#: 17.9 10*3/uL — ABNORMAL HIGH (ref 1.5–6.5)
Platelets: 169 10*3/uL (ref 145–400)
RBC: 3.81 10*6/uL (ref 3.70–5.45)
WBC: 20.3 10*3/uL — ABNORMAL HIGH (ref 3.9–10.3)
nRBC: 0 % (ref 0–0)

## 2011-11-13 MED ORDER — DIPHENHYDRAMINE HCL 50 MG/ML IJ SOLN
50.0000 mg | Freq: Once | INTRAMUSCULAR | Status: AC
  Administered 2011-11-13: 50 mg via INTRAVENOUS

## 2011-11-13 MED ORDER — HEPARIN SOD (PORK) LOCK FLUSH 100 UNIT/ML IV SOLN
500.0000 [IU] | Freq: Once | INTRAVENOUS | Status: AC | PRN
  Administered 2011-11-13: 500 [IU]
  Filled 2011-11-13: qty 5

## 2011-11-13 MED ORDER — SODIUM CHLORIDE 0.9 % IV SOLN: Freq: Once | INTRAVENOUS | Status: DC

## 2011-11-13 MED ORDER — ONDANSETRON 8 MG/50ML IVPB (CHCC)
8.0000 mg | Freq: Once | INTRAVENOUS | Status: AC
Start: 2011-11-13 — End: 2011-11-13
  Administered 2011-11-13: 8 mg via INTRAVENOUS

## 2011-11-13 MED ORDER — SODIUM CHLORIDE 0.9 % IJ SOLN
10.0000 mL | INTRAMUSCULAR | Status: DC | PRN
  Administered 2011-11-13: 10 mL
  Filled 2011-11-13: qty 10

## 2011-11-13 MED ORDER — PACLITAXEL CHEMO INJECTION 300 MG/50ML
80.0000 mg/m2 | Freq: Once | INTRAVENOUS | Status: AC
  Administered 2011-11-13: 162 mg via INTRAVENOUS
  Filled 2011-11-13: qty 27

## 2011-11-13 MED ORDER — FAMOTIDINE IN NACL 20-0.9 MG/50ML-% IV SOLN
20.0000 mg | Freq: Once | INTRAVENOUS | Status: AC
  Administered 2011-11-13: 20 mg via INTRAVENOUS

## 2011-11-13 MED ORDER — DEXAMETHASONE SODIUM PHOSPHATE 4 MG/ML IJ SOLN
20.0000 mg | Freq: Once | INTRAMUSCULAR | Status: AC
  Administered 2011-11-13: 20 mg via INTRAVENOUS

## 2011-11-13 MED ORDER — PROCHLORPERAZINE MALEATE 10 MG PO TABS: 10.0000 mg | ORAL_TABLET | Freq: Four times a day (QID) | ORAL | Status: DC | PRN

## 2011-11-13 NOTE — Telephone Encounter (Signed)
gve the pt her June 2013 appt calendar. Pt is aware to pick up her schedule the next time when she comes in the bldg. Sent michelle a staff message

## 2011-11-13 NOTE — Progress Notes (Signed)
Hematology and Oncology Follow Up Visit  Emily Knight [id number] 09/21/1955 56 y.o. 11/06/11   HPI: Emily Knight is a 56 year old [place] woman with a history of a T2, N1 C., nuclear grade 2 invasive lobular carcinoma of the left breast for which she underwent a left lumpectomy with sentinel node dissection on 08/14/2011  revealing a 2.2 cm primary lesion with 2/3 sentinal nodes involved with metastatic disease with extracapsular extension. Tumor was noted to be ER/PR positive at 20/10% respectively, HER-2 negative, with a Ki-67 of 30%. She underwent reexcision of the left axillary lymph nodes on 08/28/2011 which showed 0 of 4 nodes involved. She is participating in NSABP B-49, arm one, currently  day 7 cycle 4/4 planned adjuvant dose dense Adriamycin/Cytoxan.  Interim History:   Emily Knight is seen today for followup after her 4/4 planned adjuvant dose dense Adriamycin/Cytoxan per NSABP B-49 arm 1.  She is feeling well, denying any unexplained fevers, chills, or night sweats. No nausea, emesis issues, and no diarrhea or constipation issues. She's had a bit of heartburn, and admits that she has not been taking Pepcid AC consistently.  She denies fevers, chills, or night sweats. She does have some easy fatigability but is not affecting her daily activity.. She denies any diffuse bone pain, and is utilizing Claritin effectively. No bleeding or bruising symptoms.  A detailed review of systems is otherwise noncontributory as noted below.  Review of Systems: Constitutional:  no weight loss, fever, night sweats and feels well Eyes: uses glasses ENT: no complaints Cardiovascular: no chest pain or dyspnea on exertion Respiratory: no cough, shortness of breath, or wheezing Neurological: no TIA or stroke symptoms Dermatological: negative Gastrointestinal: no abdominal pain, change in bowel habits, or black or bloody stools Genito-Urinary: no dysuria, trouble voiding, or  hematuria Hematological and Lymphatic: negative Breast: negative for breast lumps Musculoskeletal: negative Remaining ROS negative.   Medications:   I have reviewed the patient's current medications.  Current Outpatient Prescriptions  Medication Sig Dispense Refill  . lansoprazole (PREVACID) 15 MG capsule Take 15 mg by mouth 2 (two) times daily.      Marland Kitchen lidocaine-prilocaine (EMLA) cream Apply 1 application topically as needed.      Marland Kitchen LORazepam (ATIVAN) 0.5 MG tablet Take 0.5 mg by mouth every 8 (eight) hours.      . ondansetron (ZOFRAN) 8 MG tablet Take 8 mg by mouth every 8 (eight) hours as needed.      . prochlorperazine (COMPAZINE) 10 MG tablet Take 10 mg by mouth every 6 (six) hours as needed. 1 tab every 6 hours as needed for breakthrough nausea       No current facility-administered medications for this visit.   Facility-Administered Medications Ordered in Other Visits  Medication Dose Route Frequency Provider Last Rate Last Dose  . lidocaine-prilocaine (EMLA) cream   Topical Once Pierce Crane, MD        Allergies:  Allergies  Allergen Reactions  . Adhesive (Tape) Other (See Comments)    BLISTERS  . Codeine Nausea And Vomiting    Physical Exam: Filed Vitals:   11/13/11 1138  BP: 138/95  Pulse: 99  Temp: 98.2 F (36.8 C)    Body mass index is 29.73 kg/(m^2). Weight: 191 lbs. HEENT:  Sclerae anicteric, conjunctivae pink.  Oropharynx clear, no evidence of oral mucositis or candidiasis. Nodes:  No cervical, supraclavicular, or axillary lymphadenopathy palpated.  Breast Exam:  Deferred.   Lungs:  Clear to auscultation bilaterally.  No crackles, rhonchi, or  wheezes.   Heart:  Regular rate and rhythm.   Abdomen:  Soft, nontender.  Positive bowel sounds.  No organomegaly or masses palpated.   Musculoskeletal:  No focal spinal tenderness to palpation.  Extremities:  Benign.  No peripheral edema or cyanosis.   Skin:  Benign.   Neuro:  Nonfocal, alert and oriented x  3.   Lab Results: Lab Results  Component Value Date   WBC 20.3* 11/13/2011   HGB 12.2 11/13/2011   HCT 35.5 11/13/2011   MCV 93.1 11/13/2011   PLT 169 11/13/2011   NEUTROABS 17.9* 11/13/2011     Chemistry      Component Value Date/Time   NA 135 11/13/2011 1124   K 4.0 11/13/2011 1124   CL 100 11/13/2011 1124   CO2 25 11/13/2011 1124   BUN 13 11/13/2011 1124   CREATININE 0.75 11/13/2011 1124      Component Value Date/Time   CALCIUM 9.6 11/13/2011 1124   ALKPHOS 130* 11/13/2011 1124   AST 25 11/13/2011 1124   ALT 38* 11/13/2011 1124   BILITOT 0.4 11/13/2011 1124      Lab Results  Component Value Date   LABCA2 27 09/05/2011    Radiological Studies: 11/02/11 COMPLETE ABDOMINAL ULTRASOUND  Comparison: CT 09/04/2011  Findings:  Gallbladder: No gallstones, gallbladder wall thickening, or  pericholecystic fluid.  Common bile duct: Normal caliber, 4 mm.  Liver: Slight increased echotexture throughout the liver which may  reflect fatty infiltration. No biliary dilatation or focal  abnormality.  IVC: Appears normal.  Pancreas: No focal abnormality seen.  Spleen: Within normal limits in size and echotexture.  Right Kidney: Normal in size and parenchymal echogenicity. No  evidence of mass or hydronephrosis. Simple appearing 1.6 cm cyst in  the mid pole.  Left Kidney: Normal in size and parenchymal echogenicity. No  evidence of mass or hydronephrosis.  Abdominal aorta: No aneurysm identified.  IMPRESSION:  Suspect mild fatty infiltration of the liver. No evidence for  gallbladder disease.  Original Report Authenticated By: Cyndie Chime, M.D.     Assessment:  Emily Knight is a 56 year old [place] woman with a history of a T2, N1 C., nuclear grade 2 invasive lobular carcinoma of the left breast for which she underwent a left lumpectomy with sentinel node dissection on 08/14/2011  revealing a 2.2 cm primary lesion with 2/3 sentinal nodes involved with metastatic  disease with extracapsular extension. Tumor was noted to be ER/PR positive at 20/10% respectively, HER-2 negative, with a Ki-67 of 30%. She underwent reexcision of the left axillary lymph nodes on 08/28/2011 which showed 0 of 4 nodes involved. She is participating in NSABP B-49, arm one, currently day 1 cycle 1/12 planned adjuvant weekly taxol.  2. History of grade 2 loose stools which have completely normalized, and felt to be secondary to dietary changes.  3. 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 Prophylactic Valtrex at home.   ECOG: 0  We discussed f/u plans with regards to weekly visits vs q 3 week visit. I reemphasized the need for careful assessment of neuropathy symptoms.She will have  A cbc q week, and a cmet q 3 weeks.    Plan:  Kaesha will return in one week's time for a followup exam prior to week 1/12 planned adjuvant single agent Taxol per protocol. She knows to contact us prior if the need should arise. Pierce Crane, MD 11/06/11

## 2011-11-13 NOTE — Telephone Encounter (Signed)
{

## 2011-11-13 NOTE — Progress Notes (Signed)
11/13/2011 12:30pm Patient in to clinic today, accompanied by friend, Dyke Brackett RN. Patient is anxious to begin treatment with paclitaxel. She reports mild numbness of the fourth and fifth digits on her left hand, felt to be related to surgery. She reports mild ankle swelling in the past associated with extensive time up on her feet, but has none today. Treatment plan and toxicities reviewed by Dr. Donnie Coffin and patient agrees to continued treatment. Based on lab results review and physical exam by Dr. Donnie Coffin, patient condition is deemed acceptable for treatment today. Sign for infusion given to [facility] RN, noting first paclitaxel dose to be given today.

## 2011-11-20 ENCOUNTER — Ambulatory Visit (HOSPITAL_BASED_OUTPATIENT_CLINIC_OR_DEPARTMENT_OTHER): Payer: 59 | Admitting: Physician Assistant

## 2011-11-20 ENCOUNTER — Encounter: Payer: 59 | Admitting: *Deleted

## 2011-11-20 ENCOUNTER — Telehealth: Payer: Self-pay | Admitting: *Deleted

## 2011-11-20 ENCOUNTER — Telehealth: Payer: Self-pay | Admitting: Oncology

## 2011-11-20 ENCOUNTER — Ambulatory Visit (HOSPITAL_BASED_OUTPATIENT_CLINIC_OR_DEPARTMENT_OTHER): Payer: 59

## 2011-11-20 ENCOUNTER — Other Ambulatory Visit (HOSPITAL_BASED_OUTPATIENT_CLINIC_OR_DEPARTMENT_OTHER): Payer: 59 | Admitting: Lab

## 2011-11-20 ENCOUNTER — Encounter: Payer: Self-pay | Admitting: Physician Assistant

## 2011-11-20 VITALS — BP 122/86 | HR 90 | Temp 98.8°F | Ht 67.0 in | Wt 190.4 lb

## 2011-11-20 DIAGNOSIS — Z170 Estrogen receptor positive status [ER+]: Secondary | ICD-10-CM

## 2011-11-20 DIAGNOSIS — C50419 Malignant neoplasm of upper-outer quadrant of unspecified female breast: Secondary | ICD-10-CM

## 2011-11-20 DIAGNOSIS — B009 Herpesviral infection, unspecified: Secondary | ICD-10-CM

## 2011-11-20 DIAGNOSIS — C50919 Malignant neoplasm of unspecified site of unspecified female breast: Secondary | ICD-10-CM

## 2011-11-20 DIAGNOSIS — Z5111 Encounter for antineoplastic chemotherapy: Secondary | ICD-10-CM

## 2011-11-20 LAB — CBC WITH DIFFERENTIAL/PLATELET
BASO%: 1.2 % (ref 0.0–2.0)
Basophils Absolute: 0.1 10*3/uL (ref 0.0–0.1)
EOS%: 0.6 % (ref 0.0–7.0)
HGB: 11.3 g/dL — ABNORMAL LOW (ref 11.6–15.9)
MCH: 32.9 pg (ref 25.1–34.0)
MONO%: 9.1 % (ref 0.0–14.0)
RBC: 3.42 10*6/uL — ABNORMAL LOW (ref 3.70–5.45)
RDW: 16.7 % — ABNORMAL HIGH (ref 11.2–14.5)
lymph#: 0.6 10*3/uL — ABNORMAL LOW (ref 0.9–3.3)
nRBC: 0 % (ref 0–0)

## 2011-11-20 LAB — COMPREHENSIVE METABOLIC PANEL
AST: 45 U/L — ABNORMAL HIGH (ref 0–37)
Albumin: 3.8 g/dL (ref 3.5–5.2)
BUN: 12 mg/dL (ref 6–23)
Calcium: 9.8 mg/dL (ref 8.4–10.5)
Chloride: 103 mEq/L (ref 96–112)
Glucose, Bld: 104 mg/dL — ABNORMAL HIGH (ref 70–99)
Potassium: 4.1 mEq/L (ref 3.5–5.3)
Sodium: 136 mEq/L (ref 135–145)
Total Protein: 6.9 g/dL (ref 6.0–8.3)

## 2011-11-20 MED ORDER — SODIUM CHLORIDE 0.9 % IV SOLN
Freq: Once | INTRAVENOUS | Status: AC
  Administered 2011-11-20: 10:00:00 via INTRAVENOUS

## 2011-11-20 MED ORDER — DEXAMETHASONE SODIUM PHOSPHATE 4 MG/ML IJ SOLN
20.0000 mg | Freq: Once | INTRAMUSCULAR | Status: AC
  Administered 2011-11-20: 20 mg via INTRAVENOUS

## 2011-11-20 MED ORDER — PACLITAXEL CHEMO INJECTION 300 MG/50ML
80.0000 mg/m2 | Freq: Once | INTRAVENOUS | Status: AC
  Administered 2011-11-20: 162 mg via INTRAVENOUS
  Filled 2011-11-20: qty 27

## 2011-11-20 MED ORDER — SODIUM CHLORIDE 0.9 % IJ SOLN
10.0000 mL | INTRAMUSCULAR | Status: DC | PRN
  Administered 2011-11-20: 10 mL
  Filled 2011-11-20: qty 10

## 2011-11-20 MED ORDER — FAMOTIDINE IN NACL 20-0.9 MG/50ML-% IV SOLN
20.0000 mg | Freq: Once | INTRAVENOUS | Status: AC
  Administered 2011-11-20: 20 mg via INTRAVENOUS

## 2011-11-20 MED ORDER — HEPARIN SOD (PORK) LOCK FLUSH 100 UNIT/ML IV SOLN
500.0000 [IU] | Freq: Once | INTRAVENOUS | Status: AC | PRN
  Administered 2011-11-20: 500 [IU]
  Filled 2011-11-20: qty 5

## 2011-11-20 MED ORDER — ONDANSETRON 8 MG/50ML IVPB (CHCC)
8.0000 mg | Freq: Once | INTRAVENOUS | Status: AC
  Administered 2011-11-20: 8 mg via INTRAVENOUS

## 2011-11-20 MED ORDER — DIPHENHYDRAMINE HCL 50 MG/ML IJ SOLN
50.0000 mg | Freq: Once | INTRAMUSCULAR | Status: AC
  Administered 2011-11-20: 50 mg via INTRAVENOUS

## 2011-11-20 NOTE — Progress Notes (Signed)
Hematology and Oncology Follow Up Visit  Emily Knight [id number] 1956-06-03 56 y.o. 11/20/11   HPI: Emily Knight is a 56 year old [place] woman with a history of a T2, N1, nuclear grade 2 invasive lobular carcinoma of the left breast for which she underwent a left lumpectomy with sentinel node dissection on 08/14/2011  revealing a 2.2 cm primary lesion with 2/3 sentinal nodes involved with metastatic disease with extracapsular extension. Tumor was noted to be ER/PR positive at 20/10% respectively, HER-2 negative, with a Ki-67 of 30%. She underwent reexcision of the left axillary lymph nodes on 08/28/2011 which showed 0 of 4 nodes involved. She is participating in NSABP B-49, arm one, having completed 4 cycles of adjuvant dose dense Adriamycin/Cytoxan, due for w2/12 planned weekly Taxol  Interim History:   Emily Knight is seen today prior to initiating week 2 of 12 planned Taxol.  Overall she is feeling well, though she is a bit tired due to quite a bit of emotional stress over the past week. She denies any unexplained fevers, chills, or night sweats. She does have hot flashes, they sometimes do awaken her at night. No neuropathy symptoms whatsoever. Slight nausea was experienced following her first dose of Taxol, his well controlled with Zofran. No heartburn exacerbation. She is not having any diarrhea or constipation issues. No dysuria symptoms. She still occasionally experiences some abdominal discomfort but it is fleeting. No episodes of jaundice. She said to trace pedal edema, but admits that she's not drinking as much water as she should. She denies profound fatigue. She denies any bleeding or bruising symptoms. A detailed review of systems is otherwise noncontributory as noted below.  Review of Systems: Constitutional:  no weight loss, fever, night sweats and feels well Eyes: uses glasses ENT: no complaints Cardiovascular: no chest pain or dyspnea on exertion Respiratory: no cough,  shortness of breath, or wheezing Neurological: no TIA or stroke symptoms Dermatological: negative Gastrointestinal: no abdominal pain, change in bowel habits, or black or bloody stools Genito-Urinary: no dysuria, trouble voiding, or hematuria Hematological and Lymphatic: negative Breast: negative for breast lumps Musculoskeletal: negative Remaining ROS negative.   Medications:   I have reviewed the patient's current medications.  Current Outpatient Prescriptions  Medication Sig Dispense Refill  . lansoprazole (PREVACID) 15 MG capsule Take 15 mg by mouth 2 (two) times daily.      Marland Kitchen lidocaine-prilocaine (EMLA) cream Apply 1 application topically as needed.      Marland Kitchen LORazepam (ATIVAN) 0.5 MG tablet Take 0.5 mg by mouth every 8 (eight) hours.      . ondansetron (ZOFRAN) 8 MG tablet Take 8 mg by mouth every 8 (eight) hours as needed.      . prochlorperazine (COMPAZINE) 10 MG tablet Take 1 tablet (10 mg total) by mouth every 6 (six) hours as needed. 1 tab every 6 hours as needed for breakthrough nausea  30 tablet  2   No current facility-administered medications for this visit.   Facility-Administered Medications Ordered in Other Visits  Medication Dose Route Frequency Provider Last Rate Last Dose  . lidocaine-prilocaine (EMLA) cream   Topical Once Pierce Crane, MD        Allergies:  Allergies  Allergen Reactions  . Adhesive (Tape) Other (See Comments)    BLISTERS  . Codeine Nausea And Vomiting    Physical Exam: Filed Vitals:   11/20/11 0844  BP: 122/86  Pulse: 90  Temp: 98.8 F (37.1 C)    Body mass index is 29.82 kg/(m^2).  Weight: 190 lbs. HEENT:  Sclerae anicteric, conjunctivae pink. Oropharynx does reveal evidence of an also or on the right lateral tongue region. Minimal erythema surrounding. No oral mucositis otherwise or candidiasis. Nodes:  No cervical, supraclavicular, or axillary lymphadenopathy palpated.  Breast Exam:  Deferred.   Lungs:  Clear to auscultation  bilaterally.  No crackles, rhonchi, or wheezes.   Heart:  Regular rate and rhythm.   Abdomen:  Soft, nontender.  Positive bowel sounds.  No organomegaly or masses palpated.   Musculoskeletal:  No focal spinal tenderness to palpation.  Extremities:  Benign.  No peripheral edema or cyanosis.   Skin:  Benign.   Neuro:  Nonfocal, alert and oriented x 3.   Lab Results: Lab Results  Component Value Date   WBC 6.5 11/20/2011   HGB 11.3* 11/20/2011   HCT 31.8* 11/20/2011   MCV 92.9 11/20/2011   PLT 353 11/20/2011   NEUTROABS 5.2 11/20/2011     Chemistry      Component Value Date/Time   NA 135 11/13/2011 1124   K 4.0 11/13/2011 1124   CL 100 11/13/2011 1124   CO2 25 11/13/2011 1124   BUN 13 11/13/2011 1124   CREATININE 0.75 11/13/2011 1124      Component Value Date/Time   CALCIUM 9.6 11/13/2011 1124   ALKPHOS 130* 11/13/2011 1124   AST 25 11/13/2011 1124   ALT 38* 11/13/2011 1124   BILITOT 0.4 11/13/2011 1124      Lab Results  Component Value Date   LABCA2 27 09/05/2011    Radiological Studies: 11/02/11 COMPLETE ABDOMINAL ULTRASOUND  Comparison: CT 09/04/2011  Findings:  Gallbladder: No gallstones, gallbladder wall thickening, or  pericholecystic fluid.  Common bile duct: Normal caliber, 4 mm.  Liver: Slight increased echotexture throughout the liver which may  reflect fatty infiltration. No biliary dilatation or focal  abnormality.  IVC: Appears normal.  Pancreas: No focal abnormality seen.  Spleen: Within normal limits in size and echotexture.  Right Kidney: Normal in size and parenchymal echogenicity. No  evidence of mass or hydronephrosis. Simple appearing 1.6 cm cyst in  the mid pole.  Left Kidney: Normal in size and parenchymal echogenicity. No  evidence of mass or hydronephrosis.  Abdominal aorta: No aneurysm identified.  IMPRESSION:  Suspect mild fatty infiltration of the liver. No evidence for  gallbladder disease.  Original Report Authenticated By: Cyndie Chime,  M.D.     Assessment:  Emily Knight is a 56 year old [place] woman with a history of a T2, N1, nuclear grade 2 invasive lobular carcinoma of the left breast for which she underwent a left lumpectomy with sentinel node dissection on 08/14/2011  revealing a 2.2 cm primary lesion with 2/3 sentinal nodes involved with metastatic disease with extracapsular extension. Tumor was noted to be ER/PR positive at 20/10% respectively, HER-2 negative, with a Ki-67 of 30%. She underwent reexcision of the left axillary lymph nodes on 08/28/2011 which showed 0 of 4 nodes involved. She is participating in NSABP B-49, arm one, having completed 4 cycles of adjuvant dose dense Adriamycin/Cytoxan, due for w2/12 planned weekly Taxol  2. History of grade 2 loose stools which have completely normalized, and felt to be secondary to dietary changes.  3. 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 To initiate Valtrex 500mg  orally twice a day.   ECOG: 0  Case reviewed with Dr. Pierce Crane.    Plan:  Breahna will receive week 2 of 12 plan adjuvant Taxol today  as scheduled. She will also initiate Valtrex 500 mg by mouth twice a day for 10 days then 500 mg daily for prophylaxis. She will be seen one week's time prior to week 3 cycle 12. She will monitor for neuropathy symptoms.  Trinty Marken T, PA-C 11/20/11

## 2011-11-20 NOTE — Patient Instructions (Signed)
Pt discharged home ambulatory.  Pt instructed to call with questions and concerns.   

## 2011-11-20 NOTE — Telephone Encounter (Signed)
gve the pt her June 2013 appt calendar. The pt is aware her chemo appts will be added. Sent michelle a staff message

## 2011-11-20 NOTE — Progress Notes (Signed)
Patient in to clinic today accompanied by friends, for weekly paclitaxel dose #2. She feels she has generally done well this week. Patient has grade 2 oral mucositis (sore on right side of tongue), which requires no dose modification per Table 17. Based on lab results review and physical exam by Debbora Presto PA, patient condition is acceptable for treatment today. Sign for infusion given to Yetta Glassman RN.

## 2011-11-20 NOTE — Telephone Encounter (Signed)
{

## 2011-11-23 ENCOUNTER — Other Ambulatory Visit: Payer: 59 | Admitting: Lab

## 2011-11-23 ENCOUNTER — Ambulatory Visit: Payer: 59 | Admitting: Oncology

## 2011-11-27 ENCOUNTER — Other Ambulatory Visit (HOSPITAL_BASED_OUTPATIENT_CLINIC_OR_DEPARTMENT_OTHER): Payer: 59 | Admitting: Lab

## 2011-11-27 ENCOUNTER — Ambulatory Visit (HOSPITAL_BASED_OUTPATIENT_CLINIC_OR_DEPARTMENT_OTHER): Payer: 59

## 2011-11-27 ENCOUNTER — Encounter: Payer: Self-pay | Admitting: *Deleted

## 2011-11-27 VITALS — BP 123/86 | HR 76 | Temp 98.5°F

## 2011-11-27 DIAGNOSIS — C50419 Malignant neoplasm of upper-outer quadrant of unspecified female breast: Secondary | ICD-10-CM

## 2011-11-27 DIAGNOSIS — C50919 Malignant neoplasm of unspecified site of unspecified female breast: Secondary | ICD-10-CM

## 2011-11-27 DIAGNOSIS — Z5111 Encounter for antineoplastic chemotherapy: Secondary | ICD-10-CM

## 2011-11-27 LAB — CBC WITH DIFFERENTIAL/PLATELET
BASO%: 1.1 % (ref 0.0–2.0)
Eosinophils Absolute: 0.2 10*3/uL (ref 0.0–0.5)
MCHC: 35.9 g/dL (ref 31.5–36.0)
MONO#: 0.4 10*3/uL (ref 0.1–0.9)
NEUT#: 4.4 10*3/uL (ref 1.5–6.5)
RBC: 3.33 10*6/uL — ABNORMAL LOW (ref 3.70–5.45)
RDW: 17.1 % — ABNORMAL HIGH (ref 11.2–14.5)
WBC: 5.6 10*3/uL (ref 3.9–10.3)
lymph#: 0.7 10*3/uL — ABNORMAL LOW (ref 0.9–3.3)
nRBC: 1 % — ABNORMAL HIGH (ref 0–0)

## 2011-11-27 MED ORDER — HEPARIN SOD (PORK) LOCK FLUSH 100 UNIT/ML IV SOLN
500.0000 [IU] | Freq: Once | INTRAVENOUS | Status: AC | PRN
  Administered 2011-11-27: 500 [IU]
  Filled 2011-11-27: qty 5

## 2011-11-27 MED ORDER — ONDANSETRON 8 MG/50ML IVPB (CHCC)
8.0000 mg | Freq: Once | INTRAVENOUS | Status: AC
  Administered 2011-11-27: 8 mg via INTRAVENOUS

## 2011-11-27 MED ORDER — SODIUM CHLORIDE 0.9 % IV SOLN
Freq: Once | INTRAVENOUS | Status: AC
  Administered 2011-11-27: 10:00:00 via INTRAVENOUS

## 2011-11-27 MED ORDER — DIPHENHYDRAMINE HCL 50 MG/ML IJ SOLN
50.0000 mg | Freq: Once | INTRAMUSCULAR | Status: AC
  Administered 2011-11-27: 50 mg via INTRAVENOUS

## 2011-11-27 MED ORDER — FAMOTIDINE IN NACL 20-0.9 MG/50ML-% IV SOLN
20.0000 mg | Freq: Once | INTRAVENOUS | Status: AC
  Administered 2011-11-27: 20 mg via INTRAVENOUS

## 2011-11-27 MED ORDER — DEXAMETHASONE SODIUM PHOSPHATE 4 MG/ML IJ SOLN
20.0000 mg | Freq: Once | INTRAMUSCULAR | Status: AC
  Administered 2011-11-27: 20 mg via INTRAVENOUS

## 2011-11-27 MED ORDER — SODIUM CHLORIDE 0.9 % IJ SOLN
10.0000 mL | INTRAMUSCULAR | Status: DC | PRN
  Administered 2011-11-27: 10 mL
  Filled 2011-11-27: qty 10

## 2011-11-27 MED ORDER — PACLITAXEL CHEMO INJECTION 300 MG/50ML
80.0000 mg/m2 | Freq: Once | INTRAVENOUS | Status: AC
  Administered 2011-11-27: 162 mg via INTRAVENOUS
  Filled 2011-11-27: qty 27

## 2011-11-27 NOTE — Progress Notes (Signed)
11/27/11 @ 10:00 am, NSABP B-49, Taxol Cycle 1, Day 15:  Emily Knight in to the [facility] for a CBC and to receive her third Taxol this cycle.  Her labs were appropriate to proceed with treatment.  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  She also has mild sensitivity in her nail beds which she notices if she taps a nail on something hard. She has had some "burning" in her stomach which she says is related to the Valtrex dosing.  The mouth sore has resolved and she has decreased the Valtrex to once a day and has noticed some improvement in the burning.  She is taking Prevacid, but not twice a day as instructed.  Encouraged her to take the Prevacid twice a day.  She reported having bright red blood in her stool on one occasion (Sunday November 25, 2011).  This occurred after having had some loose stools that day.  This has not recurred.  Spoke with Dr. Donnie Coffin about all of the above.  He agreed to continue with the plan of care with no new orders.  Encouraged Ms. Blash to let us know if she has a recurrence of the blood after a bowel movement.  Spoke with Yetta Glassman, RN in the infusion area and gave her a sign indicating Taxol is to be administer per institutional standards.

## 2011-11-27 NOTE — Patient Instructions (Signed)
Pt discharged home with instructions to call with concerns and complaints.  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

## 2011-12-04 ENCOUNTER — Encounter: Payer: 59 | Admitting: *Deleted

## 2011-12-04 ENCOUNTER — Ambulatory Visit (HOSPITAL_BASED_OUTPATIENT_CLINIC_OR_DEPARTMENT_OTHER): Payer: 59 | Admitting: Physician Assistant

## 2011-12-04 ENCOUNTER — Encounter: Payer: Self-pay | Admitting: Physician Assistant

## 2011-12-04 ENCOUNTER — Ambulatory Visit (HOSPITAL_BASED_OUTPATIENT_CLINIC_OR_DEPARTMENT_OTHER): Payer: 59

## 2011-12-04 ENCOUNTER — Other Ambulatory Visit (HOSPITAL_BASED_OUTPATIENT_CLINIC_OR_DEPARTMENT_OTHER): Payer: 59 | Admitting: Lab

## 2011-12-04 VITALS — BP 148/87 | HR 78 | Temp 97.7°F | Ht 67.0 in | Wt 193.4 lb

## 2011-12-04 DIAGNOSIS — C50419 Malignant neoplasm of upper-outer quadrant of unspecified female breast: Secondary | ICD-10-CM

## 2011-12-04 DIAGNOSIS — Z170 Estrogen receptor positive status [ER+]: Secondary | ICD-10-CM

## 2011-12-04 DIAGNOSIS — Z5111 Encounter for antineoplastic chemotherapy: Secondary | ICD-10-CM

## 2011-12-04 DIAGNOSIS — C50919 Malignant neoplasm of unspecified site of unspecified female breast: Secondary | ICD-10-CM

## 2011-12-04 LAB — COMPREHENSIVE METABOLIC PANEL
CO2: 22 mEq/L (ref 19–32)
Creatinine, Ser: 0.7 mg/dL (ref 0.50–1.10)
Glucose, Bld: 96 mg/dL (ref 70–99)
Sodium: 137 mEq/L (ref 135–145)
Total Bilirubin: 0.4 mg/dL (ref 0.3–1.2)
Total Protein: 7.3 g/dL (ref 6.0–8.3)

## 2011-12-04 LAB — CBC WITH DIFFERENTIAL/PLATELET
Eosinophils Absolute: 0.3 10*3/uL (ref 0.0–0.5)
HCT: 34 % — ABNORMAL LOW (ref 34.8–46.6)
HGB: 12 g/dL (ref 11.6–15.9)
LYMPH%: 14.8 % (ref 14.0–49.7)
MONO#: 0.5 10*3/uL (ref 0.1–0.9)
NEUT#: 4 10*3/uL (ref 1.5–6.5)
NEUT%: 70.5 % (ref 38.4–76.8)
Platelets: 355 10*3/uL (ref 145–400)
WBC: 5.7 10*3/uL (ref 3.9–10.3)

## 2011-12-04 MED ORDER — FAMOTIDINE IN NACL 20-0.9 MG/50ML-% IV SOLN
20.0000 mg | Freq: Once | INTRAVENOUS | Status: AC
  Administered 2011-12-04: 20 mg via INTRAVENOUS

## 2011-12-04 MED ORDER — DEXAMETHASONE SODIUM PHOSPHATE 4 MG/ML IJ SOLN
20.0000 mg | Freq: Once | INTRAMUSCULAR | Status: AC
  Administered 2011-12-04: 20 mg via INTRAVENOUS

## 2011-12-04 MED ORDER — SODIUM CHLORIDE 0.9 % IJ SOLN
10.0000 mL | INTRAMUSCULAR | Status: DC | PRN
  Administered 2011-12-04: 10 mL
  Filled 2011-12-04: qty 10

## 2011-12-04 MED ORDER — DIPHENHYDRAMINE HCL 50 MG/ML IJ SOLN
50.0000 mg | Freq: Once | INTRAMUSCULAR | Status: AC
  Administered 2011-12-04: 50 mg via INTRAVENOUS

## 2011-12-04 MED ORDER — SODIUM CHLORIDE 0.9 % IV SOLN
Freq: Once | INTRAVENOUS | Status: AC
  Administered 2011-12-04: 15:00:00 via INTRAVENOUS

## 2011-12-04 MED ORDER — PACLITAXEL CHEMO INJECTION 300 MG/50ML
80.0000 mg/m2 | Freq: Once | INTRAVENOUS | Status: AC
  Administered 2011-12-04: 162 mg via INTRAVENOUS
  Filled 2011-12-04: qty 27

## 2011-12-04 MED ORDER — HEPARIN SOD (PORK) LOCK FLUSH 100 UNIT/ML IV SOLN
500.0000 [IU] | Freq: Once | INTRAVENOUS | Status: AC | PRN
  Administered 2011-12-04: 500 [IU]
  Filled 2011-12-04: qty 5

## 2011-12-04 MED ORDER — ONDANSETRON 8 MG/50ML IVPB (CHCC)
8.0000 mg | Freq: Once | INTRAVENOUS | Status: AC
  Administered 2011-12-04: 8 mg via INTRAVENOUS

## 2011-12-04 NOTE — Progress Notes (Signed)
Hematology and Oncology Follow Up Visit  Emily Knight [id number] 07/11/1955 56 y.o. 12/04/11   HPI: Emily Knight is a 56 year old [place] woman with a history of a T2, N1, nuclear grade 2 invasive lobular carcinoma of the left breast for which she underwent a left lumpectomy with sentinel node dissection on 08/14/2011  revealing a 2.2 cm primary lesion with 2/3 sentinal nodes involved with metastatic disease with extracapsular extension. Tumor was noted to be ER/PR positive at 20/10% respectively, HER-2 negative, with a Ki-67 of 30%. She underwent reexcision of the left axillary lymph nodes on 08/28/2011 which showed 0 of 4 nodes involved. She is participating in NSABP B-49, arm one, having completed 4 cycles of adjuvant dose dense Adriamycin/Cytoxan, due for w4/12 planned weekly Taxol  Interim History:   Emily Knight is seen today prior to initiating week 4/12 planned Taxol.  Overall she is feeling well. She denies any unexplained fevers, chills, or night sweats. She does have hot flashes. No neuropathy symptoms whatsoever, but she is having some nailbed sensitivity, not affecting her ADLs. No heartburn exacerbation. She is not having any diarrhea or constipation issues. No dysuria symptoms. She denies profound fatigue. She has experienced occasional nose bleeds, but they are fleeting.  Otherwise, no bleeding or bruising symptoms. A detailed review of systems is otherwise noncontributory as noted below.  Review of Systems: Constitutional:  no weight loss, fever, night sweats and feels well Eyes: uses glasses ENT: no complaints Cardiovascular: no chest pain or dyspnea on exertion Respiratory: no cough, shortness of breath, or wheezing Neurological: no TIA or stroke symptoms Dermatological: negative Gastrointestinal: no abdominal pain, change in bowel habits, or black or bloody stools Genito-Urinary: no dysuria, trouble voiding, or hematuria Hematological and Lymphatic:  negative Breast: negative for breast lumps Musculoskeletal: negative Remaining ROS negative.   Medications:   I have reviewed the patient's current medications.  Current Outpatient Prescriptions  Medication Sig Dispense Refill  . lansoprazole (PREVACID) 15 MG capsule Take 15 mg by mouth 2 (two) times daily.      Marland Kitchen lidocaine-prilocaine (EMLA) cream Apply 1 application topically as needed.      Marland Kitchen LORazepam (ATIVAN) 0.5 MG tablet Take 0.5 mg by mouth every 8 (eight) hours.      . ondansetron (ZOFRAN) 8 MG tablet Take 8 mg by mouth every 8 (eight) hours as needed.      . prochlorperazine (COMPAZINE) 10 MG tablet Take 1 tablet (10 mg total) by mouth every 6 (six) hours as needed. 1 tab every 6 hours as needed for breakthrough nausea  30 tablet  2  . valACYclovir (VALTREX) 500 MG tablet Take 500 mg by mouth 2 (two) times daily.       No current facility-administered medications for this visit.   Facility-Administered Medications Ordered in Other Visits  Medication Dose Route Frequency Provider Last Rate Last Dose  . lidocaine-prilocaine (EMLA) cream   Topical Once Pierce Crane, MD        Allergies:  Allergies  Allergen Reactions  . Adhesive (Tape) Other (See Comments)    BLISTERS  . Codeine Nausea And Vomiting    Physical Exam: Filed Vitals:   12/04/11 1338  BP: 148/87  Pulse: 78  Temp: 97.7 F (36.5 C)    Body mass index is 30.29 kg/(m^2). Weight: 193 lbs. HEENT:  Sclerae anicteric, conjunctivae pink. Oropharynx does reveal evidence of an also or on the right lateral tongue region. Minimal erythema surrounding. No oral mucositis otherwise or candidiasis. Nodes:  No cervical, supraclavicular, or axillary lymphadenopathy palpated.  Breast Exam:  Deferred.   Lungs:  Clear to auscultation bilaterally.  No crackles, rhonchi, or wheezes.   Heart:  Regular rate and rhythm.   Abdomen:  Soft, nontender.  Positive bowel sounds.  No organomegaly or masses palpated.   Musculoskeletal:   No focal spinal tenderness to palpation.  Extremities:  Benign.  No peripheral edema or cyanosis.   Skin:  Benign.   Neuro:  Nonfocal, alert and oriented x 3.   Lab Results: Lab Results  Component Value Date   WBC 5.7 12/04/2011   HGB 12.0 12/04/2011   HCT 34.0* 12/04/2011   MCV 96.2 12/04/2011   PLT 355 12/04/2011   NEUTROABS 4.0 12/04/2011     Chemistry      Component Value Date/Time   NA 136 11/20/2011 0816   K 4.1 11/20/2011 0816   CL 103 11/20/2011 0816   CO2 24 11/20/2011 0816   BUN 12 11/20/2011 0816   CREATININE 0.70 11/20/2011 0816      Component Value Date/Time   CALCIUM 9.8 11/20/2011 0816   ALKPHOS 81 11/20/2011 0816   AST 45* 11/20/2011 0816   ALT 74* 11/20/2011 0816   BILITOT 0.3 11/20/2011 0816      Lab Results  Component Value Date   LABCA2 27 09/05/2011    Radiological Studies: 11/02/11 COMPLETE ABDOMINAL ULTRASOUND  Comparison: CT 09/04/2011  Findings:  Gallbladder: No gallstones, gallbladder wall thickening, or  pericholecystic fluid.  Common bile duct: Normal caliber, 4 mm.  Liver: Slight increased echotexture throughout the liver which may  reflect fatty infiltration. No biliary dilatation or focal  abnormality.  IVC: Appears normal.  Pancreas: No focal abnormality seen.  Spleen: Within normal limits in size and echotexture.  Right Kidney: Normal in size and parenchymal echogenicity. No  evidence of mass or hydronephrosis. Simple appearing 1.6 cm cyst in  the mid pole.  Left Kidney: Normal in size and parenchymal echogenicity. No  evidence of mass or hydronephrosis.  Abdominal aorta: No aneurysm identified.  IMPRESSION:  Suspect mild fatty infiltration of the liver. No evidence for  gallbladder disease.  Original Report Authenticated By: Cyndie Chime, M.D.     Assessment:  Ms. Furr is a 56 year old [place] woman with a history of a T2, N1, nuclear grade 2 invasive lobular carcinoma of the left breast for which she underwent a left  lumpectomy with sentinel node dissection on 08/14/2011  revealing a 2.2 cm primary lesion with 2/3 sentinal nodes involved with metastatic disease with extracapsular extension. Tumor was noted to be ER/PR positive at 20/10% respectively, HER-2 negative, with a Ki-67 of 30%. She underwent reexcision of the left axillary lymph nodes on 08/28/2011 which showed 0 of 4 nodes involved. She is participating in NSABP B-49, arm one, having completed 4 cycles of adjuvant dose dense Adriamycin/Cytoxan, due for w4/12 planned weekly Taxol  2. History of grade 2 loose stools which have completely normalized, and felt to be secondary to dietary changes.  3. 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 On Valtrex 500mg  orally a day.  4. 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 Pierce Crane.    Plan:  Emily Knight will receive week 4/12 plan adjuvant Taxol today as scheduled.  She will be seen one week's time prior to week 5/12. She will monitor for neuropathy symptoms.  Wilmore Holsomback T, PA-C 12/04/11

## 2011-12-04 NOTE — Patient Instructions (Addendum)
[facility] Discharge Instructions for Patients Receiving Chemotherapy  Today you received the following chemotherapy agents Taxol.  To help prevent nausea and vomiting after your treatment, we encourage you to take your nausea medication.   If you develop nausea and vomiting that is not controlled by your nausea medication, call the clinic. If it is after clinic hours your family physician or the after hours number for the clinic or go to the Emergency Department.   BELOW ARE SYMPTOMS THAT SHOULD BE REPORTED IMMEDIATELY:  *FEVER GREATER THAN 100.5 F  *CHILLS WITH OR WITHOUT FEVER  NAUSEA AND VOMITING THAT IS NOT CONTROLLED WITH YOUR NAUSEA MEDICATION  *UNUSUAL SHORTNESS OF BREATH  *UNUSUAL BRUISING OR BLEEDING  TENDERNESS IN MOUTH AND THROAT WITH OR WITHOUT PRESENCE OF ULCERS  *URINARY PROBLEMS  *BOWEL PROBLEMS  UNUSUAL RASH Items with * indicate a potential emergency and should be followed up as soon as possible.  One of the nurses will contact you 24 hours after your treatment. Please let the nurse know about any problems that you may have experienced. Feel free to call the clinic you have any questions or concerns. The clinic phone number is [phone].   I have been informed and understand all the instructions given to me. I know to contact the clinic, my physician, or go to the Emergency Department if any problems should occur. I do not have any questions at this time, but understand that I may call the clinic during office hours   should I have any questions or need assistance in obtaining follow up care.    __________________________________________  _____________  __________ Signature of Patient or Authorized Representative            Date                   Time    __________________________________________ Nurse's Signature    

## 2011-12-04 NOTE — Patient Instructions (Addendum)
1. Vinegar soaks:                             1 part Vinegar: 2 parts room temperature water                             Soak twice a day   2. [employer] (at gift shop)

## 2011-12-05 NOTE — Progress Notes (Signed)
Late entry for 12/04/2011 Patient in to clinic for Cycle 6, Day 1, weekly paclitaxel #4. Based on lab results review and physical exam by Debbora Presto PA, patient condition is acceptable for treatment today.  Grade 2 elevated ALT reviewed by Dr. Donnie Coffin. Dose modification table does not require change in therapy based on this adverse event, but dose may be reduced at investigator discretion. Patient denies alcohol intake other than one beer on Sunday, and she has had no alcoholic beverages since February. Per instruction from Dr. Donnie Coffin, patient will refrain from drinking alcohol. Patient reports that she is taking no pain medications and actually stopped taking Valtrex about one week ago when her mouth lesion healed. Patient also reports improvement in her RUQ abdominal pain since taking Prevacid on a regular basis. Per MD, will check CMET results prior to treatment next week. Patient aware of plan.

## 2011-12-11 ENCOUNTER — Telehealth: Payer: Self-pay | Admitting: *Deleted

## 2011-12-11 ENCOUNTER — Encounter: Payer: Self-pay | Admitting: *Deleted

## 2011-12-11 ENCOUNTER — Ambulatory Visit (HOSPITAL_BASED_OUTPATIENT_CLINIC_OR_DEPARTMENT_OTHER): Payer: 59

## 2011-12-11 ENCOUNTER — Other Ambulatory Visit (HOSPITAL_BASED_OUTPATIENT_CLINIC_OR_DEPARTMENT_OTHER): Payer: 59 | Admitting: Lab

## 2011-12-11 VITALS — BP 153/89 | HR 64 | Temp 97.1°F

## 2011-12-11 DIAGNOSIS — C50919 Malignant neoplasm of unspecified site of unspecified female breast: Secondary | ICD-10-CM

## 2011-12-11 DIAGNOSIS — C50419 Malignant neoplasm of upper-outer quadrant of unspecified female breast: Secondary | ICD-10-CM

## 2011-12-11 DIAGNOSIS — Z170 Estrogen receptor positive status [ER+]: Secondary | ICD-10-CM

## 2011-12-11 DIAGNOSIS — Z5111 Encounter for antineoplastic chemotherapy: Secondary | ICD-10-CM

## 2011-12-11 LAB — CBC WITH DIFFERENTIAL/PLATELET
Basophils Absolute: 0.1 10*3/uL (ref 0.0–0.1)
EOS%: 3.6 % (ref 0.0–7.0)
Eosinophils Absolute: 0.1 10*3/uL (ref 0.0–0.5)
HCT: 34.8 % (ref 34.8–46.6)
HGB: 12.1 g/dL (ref 11.6–15.9)
MCH: 34.4 pg — ABNORMAL HIGH (ref 25.1–34.0)
MCV: 98.6 fL (ref 79.5–101.0)
NEUT#: 3 10*3/uL (ref 1.5–6.5)
NEUT%: 72 % (ref 38.4–76.8)
RDW: 18.1 % — ABNORMAL HIGH (ref 11.2–14.5)
lymph#: 0.7 10*3/uL — ABNORMAL LOW (ref 0.9–3.3)

## 2011-12-11 LAB — COMPREHENSIVE METABOLIC PANEL
AST: 54 U/L — ABNORMAL HIGH (ref 0–37)
Albumin: 4 g/dL (ref 3.5–5.2)
BUN: 15 mg/dL (ref 6–23)
Calcium: 9.9 mg/dL (ref 8.4–10.5)
Chloride: 103 mEq/L (ref 96–112)
Creatinine, Ser: 0.77 mg/dL (ref 0.50–1.10)
Glucose, Bld: 106 mg/dL — ABNORMAL HIGH (ref 70–99)
Potassium: 4.1 mEq/L (ref 3.5–5.3)

## 2011-12-11 MED ORDER — ONDANSETRON 8 MG/50ML IVPB (CHCC)
8.0000 mg | Freq: Once | INTRAVENOUS | Status: AC
  Administered 2011-12-11: 8 mg via INTRAVENOUS

## 2011-12-11 MED ORDER — SODIUM CHLORIDE 0.9 % IJ SOLN
10.0000 mL | INTRAMUSCULAR | Status: DC | PRN
  Administered 2011-12-11: 10 mL
  Filled 2011-12-11: qty 10

## 2011-12-11 MED ORDER — PACLITAXEL CHEMO INJECTION 300 MG/50ML
80.0000 mg/m2 | Freq: Once | INTRAVENOUS | Status: AC
  Administered 2011-12-11: 162 mg via INTRAVENOUS
  Filled 2011-12-11: qty 27

## 2011-12-11 MED ORDER — DIPHENHYDRAMINE HCL 50 MG/ML IJ SOLN
50.0000 mg | Freq: Once | INTRAMUSCULAR | Status: AC
  Administered 2011-12-11: 50 mg via INTRAVENOUS

## 2011-12-11 MED ORDER — SODIUM CHLORIDE 0.9 % IV SOLN
Freq: Once | INTRAVENOUS | Status: AC
  Administered 2011-12-11: 11:00:00 via INTRAVENOUS

## 2011-12-11 MED ORDER — DEXAMETHASONE SODIUM PHOSPHATE 4 MG/ML IJ SOLN
20.0000 mg | Freq: Once | INTRAMUSCULAR | Status: AC
  Administered 2011-12-11: 20 mg via INTRAVENOUS

## 2011-12-11 MED ORDER — FAMOTIDINE IN NACL 20-0.9 MG/50ML-% IV SOLN
20.0000 mg | Freq: Once | INTRAVENOUS | Status: AC
  Administered 2011-12-11: 20 mg via INTRAVENOUS

## 2011-12-11 MED ORDER — HEPARIN SOD (PORK) LOCK FLUSH 100 UNIT/ML IV SOLN
500.0000 [IU] | Freq: Once | INTRAVENOUS | Status: AC | PRN
  Administered 2011-12-11: 500 [IU]
  Filled 2011-12-11: qty 5

## 2011-12-11 NOTE — Telephone Encounter (Signed)
Per staff message I have scheudled appts.  JMW  

## 2011-12-11 NOTE — Telephone Encounter (Signed)
Per orders from 12-06-2011 from the research nurse cindy shaw placed patient on Dr. Donnie Coffin schedule per the conversation with the nurse it was ok to double book the md on that 01-15-2012 set up patient date and time for 01-22-2012 01-29-2012 and 02-05-2012 sent [facility] email requesting treatments to be set up on 01-15-2012 ( per research nurse patient should be scheduled around 10:30am or after for the treatment) I did rely that message to [facility])

## 2011-12-11 NOTE — Patient Instructions (Signed)
[facility] Discharge Instructions for Patients Receiving Chemotherapy  Today you received the following chemotherapy agents Taxol  To help prevent nausea and vomiting after your treatment, we encourage you to take your nausea medication as prescribed.  If you develop nausea and vomiting that is not controlled by your nausea medication, call the clinic. If it is after clinic hours your family physician or the after hours number for the clinic or go to the Emergency Department.   BELOW ARE SYMPTOMS THAT SHOULD BE REPORTED IMMEDIATELY:  *FEVER GREATER THAN 100.5 F  *CHILLS WITH OR WITHOUT FEVER  NAUSEA AND VOMITING THAT IS NOT CONTROLLED WITH YOUR NAUSEA MEDICATION  *UNUSUAL SHORTNESS OF BREATH  *UNUSUAL BRUISING OR BLEEDING  TENDERNESS IN MOUTH AND THROAT WITH OR WITHOUT PRESENCE OF ULCERS  *URINARY PROBLEMS  *BOWEL PROBLEMS  UNUSUAL RASH Items with * indicate a potential emergency and should be followed up as soon as possible.  One of the nurses will contact you 24 hours after your treatment. Please let the nurse know about any problems that you may have experienced. Feel free to call the clinic you have any questions or concerns. The clinic phone number is [phone].   I have been informed and understand all the instructions given to me. I know to contact the clinic, my physician, or go to the Emergency Department if any problems should occur. I do not have any questions at this time, but understand that I may call the clinic during office hours   should I have any questions or need assistance in obtaining follow up care.    __________________________________________  _____________  __________ Signature of Patient or Authorized Representative            Date                   Time    __________________________________________ Nurse's Signature    

## 2011-12-11 NOTE — Progress Notes (Signed)
Patient in to clinic today for week 5 treatment. She is doing well in general. Per MD request, CMET was repeated today to evaluate elevation in ALT noted last week. Toxicity has improved from grade 2 to grade 1, therefore will continue with treatment today. Sign for infusion given to infusion nurses Tammi Marcelle Overlie RN and Nelly Laurence RN.

## 2011-12-18 ENCOUNTER — Other Ambulatory Visit (HOSPITAL_BASED_OUTPATIENT_CLINIC_OR_DEPARTMENT_OTHER): Payer: 59 | Admitting: Lab

## 2011-12-18 ENCOUNTER — Encounter: Payer: 59 | Admitting: *Deleted

## 2011-12-18 ENCOUNTER — Ambulatory Visit (HOSPITAL_BASED_OUTPATIENT_CLINIC_OR_DEPARTMENT_OTHER): Payer: 59

## 2011-12-18 VITALS — BP 131/89 | HR 73 | Temp 98.1°F

## 2011-12-18 DIAGNOSIS — C50419 Malignant neoplasm of upper-outer quadrant of unspecified female breast: Secondary | ICD-10-CM

## 2011-12-18 DIAGNOSIS — Z5111 Encounter for antineoplastic chemotherapy: Secondary | ICD-10-CM

## 2011-12-18 DIAGNOSIS — C50919 Malignant neoplasm of unspecified site of unspecified female breast: Secondary | ICD-10-CM

## 2011-12-18 LAB — CBC WITH DIFFERENTIAL/PLATELET
Basophils Absolute: 0.1 10*3/uL (ref 0.0–0.1)
Eosinophils Absolute: 0.1 10*3/uL (ref 0.0–0.5)
HGB: 11.8 g/dL (ref 11.6–15.9)
MONO#: 0.2 10*3/uL (ref 0.1–0.9)
NEUT#: 3 10*3/uL (ref 1.5–6.5)
RBC: 3.38 10*6/uL — ABNORMAL LOW (ref 3.70–5.45)
RDW: 17.7 % — ABNORMAL HIGH (ref 11.2–14.5)
WBC: 4 10*3/uL (ref 3.9–10.3)

## 2011-12-18 MED ORDER — HEPARIN SOD (PORK) LOCK FLUSH 100 UNIT/ML IV SOLN
500.0000 [IU] | Freq: Once | INTRAVENOUS | Status: AC | PRN
  Administered 2011-12-18: 500 [IU]
  Filled 2011-12-18: qty 5

## 2011-12-18 MED ORDER — PACLITAXEL CHEMO INJECTION 300 MG/50ML
80.0000 mg/m2 | Freq: Once | INTRAVENOUS | Status: AC
  Administered 2011-12-18: 162 mg via INTRAVENOUS
  Filled 2011-12-18: qty 27

## 2011-12-18 MED ORDER — FAMOTIDINE IN NACL 20-0.9 MG/50ML-% IV SOLN
20.0000 mg | Freq: Once | INTRAVENOUS | Status: AC
  Administered 2011-12-18: 20 mg via INTRAVENOUS

## 2011-12-18 MED ORDER — ONDANSETRON 8 MG/50ML IVPB (CHCC)
8.0000 mg | Freq: Once | INTRAVENOUS | Status: AC
  Administered 2011-12-18: 8 mg via INTRAVENOUS

## 2011-12-18 MED ORDER — DEXAMETHASONE SODIUM PHOSPHATE 4 MG/ML IJ SOLN
20.0000 mg | Freq: Once | INTRAMUSCULAR | Status: AC
  Administered 2011-12-18: 20 mg via INTRAVENOUS

## 2011-12-18 MED ORDER — SODIUM CHLORIDE 0.9 % IV SOLN
Freq: Once | INTRAVENOUS | Status: AC
  Administered 2011-12-18: 10:00:00 via INTRAVENOUS

## 2011-12-18 MED ORDER — SODIUM CHLORIDE 0.9 % IJ SOLN
10.0000 mL | INTRAMUSCULAR | Status: DC | PRN
  Administered 2011-12-18: 10 mL
  Filled 2011-12-18: qty 10

## 2011-12-18 MED ORDER — DIPHENHYDRAMINE HCL 50 MG/ML IJ SOLN
50.0000 mg | Freq: Once | INTRAMUSCULAR | Status: AC
  Administered 2011-12-18: 50 mg via INTRAVENOUS

## 2011-12-18 NOTE — Patient Instructions (Signed)
[facility] Discharge Instructions for Patients Receiving Chemotherapy  Today you received the following chemotherapy agents Taxol  To help prevent nausea and vomiting after your treatment, we encourage you to take your nausea medication  Begin taking it at 7 pm and take it as often as prescribed for the next 24 to 72 hours.   If you develop nausea and vomiting that is not controlled by your nausea medication, call the clinic. If it is after clinic hours your family physician or the after hours number for the clinic or go to the Emergency Department.   BELOW ARE SYMPTOMS THAT SHOULD BE REPORTED IMMEDIATELY:  *FEVER GREATER THAN 100.5 F  *CHILLS WITH OR WITHOUT FEVER  NAUSEA AND VOMITING THAT IS NOT CONTROLLED WITH YOUR NAUSEA MEDICATION  *UNUSUAL SHORTNESS OF BREATH  *UNUSUAL BRUISING OR BLEEDING  TENDERNESS IN MOUTH AND THROAT WITH OR WITHOUT PRESENCE OF ULCERS  *URINARY PROBLEMS  *BOWEL PROBLEMS  UNUSUAL RASH Items with * indicate a potential emergency and should be followed up as soon as possible.  One of the nurses will contact you 24 hours after your treatment. Please let the nurse know about any problems that you may have experienced. Feel free to call the clinic you have any questions or concerns. The clinic phone number is [phone].   I have been informed and understand all the instructions given to me. I know to contact the clinic, my physician, or go to the Emergency Department if any problems should occur. I do not have any questions at this time, but understand that I may call the clinic during office hours   should I have any questions or need assistance in obtaining follow up care.    __________________________________________  _____________  __________ Signature of Patient or Authorized Representative            Date                   Time    __________________________________________ Nurse's Signature    

## 2011-12-18 NOTE — Progress Notes (Signed)
Patient in to clinic today for weekly paclitaxel dose #6. Patient states she is doing well, with some numbness of fingertips and soles of feet, not interfering with function. She reports continued hot flashes. CBC within parameters for treatment today. As per Dr. Renelda Loma assessment following last week's lab work, it is not necessary to repeat CMET this week as ALT resolved to grade 1 last week, and liver function will be evaluated prior to next week's treatment. Patient is in agreement with plan, which was also discussed with pharmacist Merlene Laughter and infusion nurse. Sign for infusion given to infusion nurse, Melton Krebs RN.

## 2011-12-25 ENCOUNTER — Telehealth: Payer: Self-pay | Admitting: *Deleted

## 2011-12-25 ENCOUNTER — Other Ambulatory Visit (HOSPITAL_BASED_OUTPATIENT_CLINIC_OR_DEPARTMENT_OTHER): Payer: 59 | Admitting: Lab

## 2011-12-25 ENCOUNTER — Ambulatory Visit (HOSPITAL_BASED_OUTPATIENT_CLINIC_OR_DEPARTMENT_OTHER): Payer: 59 | Admitting: Physician Assistant

## 2011-12-25 ENCOUNTER — Ambulatory Visit: Payer: 59

## 2011-12-25 ENCOUNTER — Encounter: Payer: 59 | Admitting: *Deleted

## 2011-12-25 VITALS — BP 129/86 | HR 87 | Temp 98.7°F | Ht 67.0 in | Wt 194.1 lb

## 2011-12-25 DIAGNOSIS — C50419 Malignant neoplasm of upper-outer quadrant of unspecified female breast: Secondary | ICD-10-CM

## 2011-12-25 DIAGNOSIS — C50919 Malignant neoplasm of unspecified site of unspecified female breast: Secondary | ICD-10-CM

## 2011-12-25 DIAGNOSIS — B009 Herpesviral infection, unspecified: Secondary | ICD-10-CM

## 2011-12-25 DIAGNOSIS — Z170 Estrogen receptor positive status [ER+]: Secondary | ICD-10-CM

## 2011-12-25 DIAGNOSIS — G609 Hereditary and idiopathic neuropathy, unspecified: Secondary | ICD-10-CM

## 2011-12-25 DIAGNOSIS — G629 Polyneuropathy, unspecified: Secondary | ICD-10-CM

## 2011-12-25 LAB — CBC WITH DIFFERENTIAL/PLATELET
Eosinophils Absolute: 0.1 10*3/uL (ref 0.0–0.5)
HCT: 34.6 % — ABNORMAL LOW (ref 34.8–46.6)
LYMPH%: 17.1 % (ref 14.0–49.7)
MCHC: 35.7 g/dL (ref 31.5–36.0)
MCV: 100.5 fL (ref 79.5–101.0)
MONO#: 0.3 10*3/uL (ref 0.1–0.9)
MONO%: 7.2 % (ref 0.0–14.0)
NEUT%: 71.8 % (ref 38.4–76.8)
Platelets: 332 10*3/uL (ref 145–400)
RBC: 3.44 10*6/uL — ABNORMAL LOW (ref 3.70–5.45)
WBC: 4.4 10*3/uL (ref 3.9–10.3)

## 2011-12-25 LAB — COMPREHENSIVE METABOLIC PANEL
ALT: 104 U/L — ABNORMAL HIGH (ref 0–35)
Alkaline Phosphatase: 57 U/L (ref 39–117)
CO2: 23 mEq/L (ref 19–32)
Creatinine, Ser: 0.84 mg/dL (ref 0.50–1.10)
Sodium: 138 mEq/L (ref 135–145)
Total Bilirubin: 0.4 mg/dL (ref 0.3–1.2)
Total Protein: 6.8 g/dL (ref 6.0–8.3)

## 2011-12-25 NOTE — Progress Notes (Signed)
Hematology and Oncology Follow Up Visit  Emily Knight [id number] September 02, 1955 56 y.o. 12/25/11  HPI: Emily Knight is a 56 year old [place] woman with a history of a T2, N1, nuclear grade 2 invasive lobular carcinoma of the left breast for which she underwent a left lumpectomy with sentinel node dissection on 08/14/2011  revealing a 2.2 cm primary lesion with 2/3 sentinal nodes involved with metastatic disease with extracapsular extension. Tumor was noted to be ER/PR positive at 20/10% respectively, HER-2 negative, with a Ki-67 of 30%. She underwent reexcision of the left axillary lymph nodes on 08/28/2011 which showed 0 of 4 nodes involved. She is participating in NSABP B-49, arm one, having completed 4 cycles of adjuvant dose dense Adriamycin/Cytoxan, due for w7/12 planned weekly Taxol  Interim History:   Emily Knight is seen today prior to initiating week 7/12 planned Taxol.  Overall she is feeling well. She denies any unexplained fevers, chills, or night sweats. She does have hot flashes.  No heartburn exacerbation. She is not having any diarrhea or constipation issues. No dysuria symptoms. She denies profound fatigue.  No bleeding or bruising symptoms.  Emily Knight has noted more neuropathy symptoms, affecting all of her fingers, toes, soles of her feet. She has also noticed some "numbness" in the. Oral region. Her ADLs are beginning to become effected. A detailed review of systems is otherwise noncontributory as noted below.  Review of Systems: Constitutional:  no weight loss, fever, night sweats and feels well Eyes: uses glasses ENT: no complaints Cardiovascular: no chest pain or dyspnea on exertion Respiratory: no cough, shortness of breath, or wheezing Neurological: no TIA or stroke symptoms Dermatological: negative Gastrointestinal: no abdominal pain, change in bowel habits, or black or bloody stools Genito-Urinary: no dysuria, trouble voiding, or hematuria Hematological and  Lymphatic: negative Breast: negative for breast lumps Musculoskeletal: negative Remaining ROS negative.   Medications:   I have reviewed the patient's current medications.  Current Outpatient Prescriptions  Medication Sig Dispense Refill  . lansoprazole (PREVACID) 15 MG capsule Take 15 mg by mouth 2 (two) times daily.      Marland Kitchen lidocaine-prilocaine (EMLA) cream Apply 1 application topically as needed.      Marland Kitchen LORazepam (ATIVAN) 0.5 MG tablet Take 0.5 mg by mouth every 8 (eight) hours.      . ondansetron (ZOFRAN) 8 MG tablet Take 8 mg by mouth every 8 (eight) hours as needed.      . prochlorperazine (COMPAZINE) 10 MG tablet Take 1 tablet (10 mg total) by mouth every 6 (six) hours as needed. 1 tab every 6 hours as needed for breakthrough nausea  30 tablet  2  . valACYclovir (VALTREX) 500 MG tablet Take 500 mg by mouth 2 (two) times daily. Per patient on 12/04/2011, stopped taking about one week ago.       No current facility-administered medications for this visit.   Facility-Administered Medications Ordered in Other Visits  Medication Dose Route Frequency Provider Last Rate Last Dose  . lidocaine-prilocaine (EMLA) cream   Topical Once Pierce Crane, MD        Allergies:  Allergies  Allergen Reactions  . Adhesive (Tape) Other (See Comments)    BLISTERS  . Codeine Nausea And Vomiting    Physical Exam: Filed Vitals:   12/25/11 0933  BP: 129/86  Pulse: 87  Temp: 98.7 F (37.1 C)    Body mass index is 30.40 kg/(m^2). Weight: 194 lbs. HEENT:  Sclerae anicteric, conjunctivae pink. Oropharynx does reveal evidence of an also  or on the right lateral tongue region. Minimal erythema surrounding. No oral mucositis otherwise or candidiasis. Nodes:  No cervical, supraclavicular, or axillary lymphadenopathy palpated.  Breast Exam:  Deferred.   Lungs:  Clear to auscultation bilaterally.  No crackles, rhonchi, or wheezes.   Heart:  Regular rate and rhythm.   Abdomen:  Soft, nontender.  Positive  bowel sounds.  No organomegaly or masses palpated.   Musculoskeletal:  No focal spinal tenderness to palpation.  Extremities:  Benign.  No peripheral edema or cyanosis.   Skin:  Benign.   Neuro:  Nonfocal, alert and oriented x 3, of note strength is intact.   Lab Results: Lab Results  Component Value Date   WBC 4.4 12/25/2011   HGB 12.4 12/25/2011   HCT 34.6* 12/25/2011   MCV 100.5 12/25/2011   PLT 332 12/25/2011   NEUTROABS 3.2 12/25/2011     Chemistry      Component Value Date/Time   NA 138 12/25/2011 0921   K 4.0 12/25/2011 0921   CL 104 12/25/2011 0921   CO2 23 12/25/2011 0921   BUN 15 12/25/2011 0921   CREATININE 0.84 12/25/2011 0921      Component Value Date/Time   CALCIUM 9.5 12/25/2011 0921   ALKPHOS 57 12/25/2011 0921   AST 52* 12/25/2011 0921   ALT 104* 12/25/2011 0921   BILITOT 0.4 12/25/2011 0921      Lab Results  Component Value Date   LABCA2 27 09/05/2011    Radiological Studies: 11/02/11 COMPLETE ABDOMINAL ULTRASOUND  Comparison: CT 09/04/2011  Findings:  Gallbladder: No gallstones, gallbladder wall thickening, or  pericholecystic fluid.  Common bile duct: Normal caliber, 4 mm.  Liver: Slight increased echotexture throughout the liver which may  reflect fatty infiltration. No biliary dilatation or focal  abnormality.  IVC: Appears normal.  Pancreas: No focal abnormality seen.  Spleen: Within normal limits in size and echotexture.  Right Kidney: Normal in size and parenchymal echogenicity. No  evidence of mass or hydronephrosis. Simple appearing 1.6 cm cyst in  the mid pole.  Left Kidney: Normal in size and parenchymal echogenicity. No  evidence of mass or hydronephrosis.  Abdominal aorta: No aneurysm identified.  IMPRESSION:  Suspect mild fatty infiltration of the liver. No evidence for  gallbladder disease.  Original Report Authenticated By: Cyndie Chime, M.D.     Assessment:  Ms. Hausner is a 56 year old [place] woman with a history of a T2, N1,  nuclear grade 2 invasive lobular carcinoma of the left breast for which she underwent a left lumpectomy with sentinel node dissection on 08/14/2011  revealing a 2.2 cm primary lesion with 2/3 sentinal nodes involved with metastatic disease with extracapsular extension. Tumor was noted to be ER/PR positive at 20/10% respectively, HER-2 negative, with a Ki-67 of 30%. She underwent reexcision of the left axillary lymph nodes on 08/28/2011 which showed 0 of 4 nodes involved. She is participating in NSABP B-49, arm one, having completed 4 cycles of adjuvant dose dense Adriamycin/Cytoxan, due for w7/12 planned weekly Taxol  2. History of grade 2 loose stools which have completely normalized, and felt to be secondary to dietary changes.  3. History of herpes simplex on Valtrex 500mg  orally a day.  4. 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 Grade 2 peripheral neuropathy   ECOG: 1  Case reviewed with Dr. Pierce Crane.    Plan:  Emily Knight will be delayed one week in regards to week 7 of 12 planned adjuvant Taxol.  She will  obtain a vitamin B complex and takes daily, though she will notify us if her symptomatology should worsen. In that instance, I would consider gabapentin. For hot flashes she can also try vitamin D 400 international units daily. We will regroup in one week's time for followup and reassessment prior to possibly resuming Taxol though it will be given at a 1 dose level decrease. Sila knows to contact us in the interim if the need should arise.  Shafter Jupin T, PA-C 12/25/11

## 2011-12-25 NOTE — Progress Notes (Signed)
Patient in to clinic today for evaluation for cycle 7 weekly paclitaxel treatment. Patient reports persistent in fingers to the second joint and the soles of her feet that make it feel like she is "walking on pillows". She has some effect on her function as she has to be cautious when picking up cups to drink, and using care when ambulating. These symptoms have been persistent since her last treatment, and she has also noticed numbness around her mouths and lips, similar "to Novocaine". Based on these assessments, decision made to hold treatment today and resume next week at reduced dose, per protocol table 18. Patient is in agreement with this plan.

## 2011-12-25 NOTE — Telephone Encounter (Signed)
Made patient appointment for 12-31-2011 lab and md apppointment treatment on 01-01-2012

## 2011-12-25 NOTE — Telephone Encounter (Signed)
Gave patient appointment for 12-31-2011 starting at 12:45pm printed out calendar and gave to the patient

## 2011-12-28 ENCOUNTER — Encounter: Payer: Self-pay | Admitting: *Deleted

## 2011-12-28 ENCOUNTER — Telehealth: Payer: Self-pay | Admitting: *Deleted

## 2011-12-28 NOTE — Telephone Encounter (Signed)
Contacted patient by phone after receiving voice mail message on Wednesday inquiring about dosage for vitamin E and vitamin B complex supplements as instructed by Debbora Presto. Patient will continue taking according to package label and will bring bottles to clinic visit on Monday to clarify dosing.

## 2011-12-31 ENCOUNTER — Ambulatory Visit (HOSPITAL_BASED_OUTPATIENT_CLINIC_OR_DEPARTMENT_OTHER): Payer: 59 | Admitting: Physician Assistant

## 2011-12-31 ENCOUNTER — Other Ambulatory Visit (HOSPITAL_BASED_OUTPATIENT_CLINIC_OR_DEPARTMENT_OTHER): Payer: 59 | Admitting: Lab

## 2011-12-31 VITALS — BP 134/88 | HR 94 | Temp 98.9°F | Ht 67.0 in | Wt 194.0 lb

## 2011-12-31 DIAGNOSIS — Z170 Estrogen receptor positive status [ER+]: Secondary | ICD-10-CM

## 2011-12-31 DIAGNOSIS — G609 Hereditary and idiopathic neuropathy, unspecified: Secondary | ICD-10-CM

## 2011-12-31 DIAGNOSIS — C50419 Malignant neoplasm of upper-outer quadrant of unspecified female breast: Secondary | ICD-10-CM

## 2011-12-31 DIAGNOSIS — C50919 Malignant neoplasm of unspecified site of unspecified female breast: Secondary | ICD-10-CM

## 2011-12-31 LAB — COMPREHENSIVE METABOLIC PANEL
ALT: 69 U/L — ABNORMAL HIGH (ref 0–35)
AST: 40 U/L — ABNORMAL HIGH (ref 0–37)
Albumin: 4.1 g/dL (ref 3.5–5.2)
BUN: 11 mg/dL (ref 6–23)
CO2: 24 mEq/L (ref 19–32)
Calcium: 9.9 mg/dL (ref 8.4–10.5)
Chloride: 100 mEq/L (ref 96–112)
Potassium: 3.9 mEq/L (ref 3.5–5.3)

## 2011-12-31 LAB — CBC WITH DIFFERENTIAL/PLATELET
BASO%: 0.7 % (ref 0.0–2.0)
EOS%: 2.5 % (ref 0.0–7.0)
MCH: 35.5 pg — ABNORMAL HIGH (ref 25.1–34.0)
MCHC: 35 g/dL (ref 31.5–36.0)
RDW: 15.2 % — ABNORMAL HIGH (ref 11.2–14.5)
lymph#: 0.8 10*3/uL — ABNORMAL LOW (ref 0.9–3.3)

## 2011-12-31 NOTE — Progress Notes (Signed)
Hematology and Oncology Follow Up Visit  Emily Knight [id number] June 30, 1955 56 y.o. 12/31/11  HPI: Emily Knight is a 56 year old [place] woman with a history of a T2, N1, nuclear grade 2 invasive lobular carcinoma of the left breast for which she underwent a left lumpectomy with sentinel node dissection on 08/14/2011  revealing a 2.2 cm primary lesion with 2/3 sentinal nodes involved with metastatic disease with extracapsular extension. Tumor was noted to be ER/PR positive at 20/10% respectively, HER-2 negative, with a Ki-67 of 30%. She underwent reexcision of the left axillary lymph nodes on 08/28/2011 which showed 0 of 4 nodes involved. She is participating in NSABP B-49, arm one, having completed 4 cycles of adjuvant dose dense Adriamycin/Cytoxan, due for w7/12 planned weekly Taxol after 1 week delay due to grade 2 peripheral neuropathy.  Interim History:   Emily Knight is seen today prior to initiating week 7/12 planned Taxol, after a 1 week delay.  Overall she is feeling well. She denies any unexplained fevers, chills, or night sweats. She does have hot flashes.  No heartburn exacerbation. She is not having any diarrhea or constipation issues. No dysuria symptoms. She denies profound fatigue.  No bleeding or bruising symptoms.  Emily Knight states the "numbness" in the oral region has abated.  She still notes slight sensory change in her fingertips, but her ADLs not affected at this time. A detailed review of systems is otherwise noncontributory as noted below.  Review of Systems: Constitutional:  no weight loss, fever, night sweats and feels well Eyes: uses glasses ENT: no complaints Cardiovascular: no chest pain or dyspnea on exertion Respiratory: no cough, shortness of breath, or wheezing Neurological: no TIA or stroke symptoms Dermatological: negative Gastrointestinal: no abdominal pain, change in bowel habits, or black or bloody stools Genito-Urinary: no dysuria, trouble  voiding, or hematuria Hematological and Lymphatic: negative Breast: negative for breast lumps Musculoskeletal: negative Remaining ROS negative.   Medications:   I have reviewed the patient's current medications.  Current Outpatient Prescriptions  Medication Sig Dispense Refill  . lansoprazole (PREVACID) 15 MG capsule Take 15 mg by mouth 2 (two) times daily.      Marland Kitchen lidocaine-prilocaine (EMLA) cream Apply 1 application topically as needed.      Marland Kitchen LORazepam (ATIVAN) 0.5 MG tablet Take 0.5 mg by mouth every 8 (eight) hours.      . ondansetron (ZOFRAN) 8 MG tablet Take 8 mg by mouth every 8 (eight) hours as needed.      . prochlorperazine (COMPAZINE) 10 MG tablet Take 1 tablet (10 mg total) by mouth every 6 (six) hours as needed. 1 tab every 6 hours as needed for breakthrough nausea  30 tablet  2  . valACYclovir (VALTREX) 500 MG tablet Take 500 mg by mouth 2 (two) times daily. Per patient on 12/04/2011, stopped taking about one week ago.       No current facility-administered medications for this visit.   Facility-Administered Medications Ordered in Other Visits  Medication Dose Route Frequency Provider Last Rate Last Dose  . lidocaine-prilocaine (EMLA) cream   Topical Once Pierce Crane, MD        Allergies:  Allergies  Allergen Reactions  . Adhesive (Tape) Other (See Comments)    BLISTERS  . Codeine Nausea And Vomiting    Physical Exam: Filed Vitals:   12/31/11 1309  BP: 134/88  Pulse: 94  Temp: 98.9 F (37.2 C)    Body mass index is 30.38 kg/(m^2). Weight: 194 lbs. HEENT:  Sclerae anicteric, conjunctivae pink. Oropharynx does reveal evidence of an also or on the right lateral tongue region. Minimal erythema surrounding. No oral mucositis otherwise or candidiasis. Nodes:  No cervical, supraclavicular, or axillary lymphadenopathy palpated.  Breast Exam:  Deferred.   Lungs:  Clear to auscultation bilaterally.  No crackles, rhonchi, or wheezes.   Heart:  Regular rate and rhythm.    Abdomen:  Soft, nontender.  Positive bowel sounds.  No organomegaly or masses palpated.   Musculoskeletal:  No focal spinal tenderness to palpation.  Extremities:  Benign.  No peripheral edema or cyanosis.   Skin:  Benign.   Neuro:  Nonfocal, alert and oriented x 3, of note strength is intact.   Lab Results: Lab Results  Component Value Date   WBC 7.2 12/31/2011   HGB 12.4 12/31/2011   HCT 35.4 12/31/2011   MCV 101.4* 12/31/2011   PLT 325 12/31/2011   NEUTROABS 5.5 12/31/2011     Chemistry      Component Value Date/Time   NA 134* 12/31/2011 1251   K 3.9 12/31/2011 1251   CL 100 12/31/2011 1251   CO2 24 12/31/2011 1251   BUN 11 12/31/2011 1251   CREATININE 0.77 12/31/2011 1251      Component Value Date/Time   CALCIUM 9.9 12/31/2011 1251   ALKPHOS 64 12/31/2011 1251   AST 40* 12/31/2011 1251   ALT 69* 12/31/2011 1251   BILITOT 0.5 12/31/2011 1251      Lab Results  Component Value Date   LABCA2 27 09/05/2011    Radiological Studies: 11/02/11 COMPLETE ABDOMINAL ULTRASOUND  Comparison: CT 09/04/2011  Findings:  Gallbladder: No gallstones, gallbladder wall thickening, or  pericholecystic fluid.  Common bile duct: Normal caliber, 4 mm.  Liver: Slight increased echotexture throughout the liver which may  reflect fatty infiltration. No biliary dilatation or focal  abnormality.  IVC: Appears normal.  Pancreas: No focal abnormality seen.  Spleen: Within normal limits in size and echotexture.  Right Kidney: Normal in size and parenchymal echogenicity. No  evidence of mass or hydronephrosis. Simple appearing 1.6 cm cyst in  the mid pole.  Left Kidney: Normal in size and parenchymal echogenicity. No  evidence of mass or hydronephrosis.  Abdominal aorta: No aneurysm identified.  IMPRESSION:  Suspect mild fatty infiltration of the liver. No evidence for  gallbladder disease.  Original Report Authenticated By: Cyndie Chime, M.D.     Assessment:  Emily Knight is a 56 year old  [place] woman with a history of a T2, N1, nuclear grade 2 invasive lobular carcinoma of the left breast for which she underwent a left lumpectomy with sentinel node dissection on 08/14/2011  revealing a 2.2 cm primary lesion with 2/3 sentinal nodes involved with metastatic disease with extracapsular extension. Tumor was noted to be ER/PR positive at 20/10% respectively, HER-2 negative, with a Ki-67 of 30%. She underwent reexcision of the left axillary lymph nodes on 08/28/2011 which showed 0 of 4 nodes involved. She is participating in NSABP B-49, arm one, having completed 4 cycles of adjuvant dose dense Adriamycin/Cytoxan, due for w7/12 planned weekly Taxol after 1 week delay due to grade 2 peripheral neuropathy.  2. History of grade 2 loose stools which have completely normalized, and felt to be secondary to dietary changes.  3. History of herpes simplex on Valtrex 500mg  orally a day.  4. 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 Grade 1 peripheral neuropathy, on vitamin B-complex daily.   ECOG: 0  Case reviewed with Dr. Theron Arista  Donnie Coffin.    Plan:  Kalicia will return on 01/01/12 to receive week 7 of 12 planned adjuvant Taxol, though at a 25% dose reduction.   We will regroup in one week's time for followup prior to week 8. Aurelia knows to contact us in the interim if the need should arise.  Jakobi Thetford T, PA-C 12/31/11

## 2012-01-01 ENCOUNTER — Ambulatory Visit (HOSPITAL_BASED_OUTPATIENT_CLINIC_OR_DEPARTMENT_OTHER): Payer: 59

## 2012-01-01 ENCOUNTER — Encounter: Payer: Self-pay | Admitting: *Deleted

## 2012-01-01 ENCOUNTER — Other Ambulatory Visit: Payer: 59 | Admitting: Lab

## 2012-01-01 VITALS — BP 134/87 | HR 84 | Temp 98.4°F

## 2012-01-01 DIAGNOSIS — Z5111 Encounter for antineoplastic chemotherapy: Secondary | ICD-10-CM

## 2012-01-01 DIAGNOSIS — Z170 Estrogen receptor positive status [ER+]: Secondary | ICD-10-CM

## 2012-01-01 DIAGNOSIS — C50419 Malignant neoplasm of upper-outer quadrant of unspecified female breast: Secondary | ICD-10-CM

## 2012-01-01 DIAGNOSIS — C50919 Malignant neoplasm of unspecified site of unspecified female breast: Secondary | ICD-10-CM

## 2012-01-01 MED ORDER — DEXAMETHASONE SODIUM PHOSPHATE 10 MG/ML IJ SOLN
10.0000 mg | Freq: Once | INTRAMUSCULAR | Status: AC
Start: 2012-01-01 — End: 2012-01-01
  Administered 2012-01-01: 10 mg via INTRAVENOUS

## 2012-01-01 MED ORDER — SODIUM CHLORIDE 0.9 % IV SOLN
Freq: Once | INTRAVENOUS | Status: AC
  Administered 2012-01-01: 10:00:00 via INTRAVENOUS

## 2012-01-01 MED ORDER — FAMOTIDINE IN NACL 20-0.9 MG/50ML-% IV SOLN
20.0000 mg | Freq: Once | INTRAVENOUS | Status: AC
  Administered 2012-01-01: 20 mg via INTRAVENOUS

## 2012-01-01 MED ORDER — SODIUM CHLORIDE 0.9 % IJ SOLN
10.0000 mL | INTRAMUSCULAR | Status: DC | PRN
  Administered 2012-01-01: 10 mL
  Filled 2012-01-01: qty 10

## 2012-01-01 MED ORDER — PACLITAXEL CHEMO INJECTION 300 MG/50ML
60.0000 mg/m2 | Freq: Once | INTRAVENOUS | Status: AC
  Administered 2012-01-01: 120 mg via INTRAVENOUS
  Filled 2012-01-01: qty 20

## 2012-01-01 MED ORDER — HEPARIN SOD (PORK) LOCK FLUSH 100 UNIT/ML IV SOLN
500.0000 [IU] | Freq: Once | INTRAVENOUS | Status: AC | PRN
  Administered 2012-01-01: 500 [IU]
  Filled 2012-01-01: qty 5

## 2012-01-01 MED ORDER — DIPHENHYDRAMINE HCL 50 MG/ML IJ SOLN
50.0000 mg | Freq: Once | INTRAMUSCULAR | Status: AC
  Administered 2012-01-01: 50 mg via INTRAVENOUS

## 2012-01-01 MED ORDER — ONDANSETRON 8 MG/50ML IVPB (CHCC)
8.0000 mg | Freq: Once | INTRAVENOUS | Status: AC
  Administered 2012-01-01: 8 mg via INTRAVENOUS

## 2012-01-01 NOTE — Patient Instructions (Signed)
[facility] Discharge Instructions for Patients Receiving Chemotherapy  Today you received the following chemotherapy agents Taxol.  To help prevent nausea and vomiting after your treatment, we encourage you to take your nausea medication.   If you develop nausea and vomiting that is not controlled by your nausea medication, call the clinic. If it is after clinic hours your family physician or the after hours number for the clinic or go to the Emergency Department.   BELOW ARE SYMPTOMS THAT SHOULD BE REPORTED IMMEDIATELY:  *FEVER GREATER THAN 100.5 F  *CHILLS WITH OR WITHOUT FEVER  NAUSEA AND VOMITING THAT IS NOT CONTROLLED WITH YOUR NAUSEA MEDICATION  *UNUSUAL SHORTNESS OF BREATH  *UNUSUAL BRUISING OR BLEEDING  TENDERNESS IN MOUTH AND THROAT WITH OR WITHOUT PRESENCE OF ULCERS  *URINARY PROBLEMS  *BOWEL PROBLEMS  UNUSUAL RASH Items with * indicate a potential emergency and should be followed up as soon as possible.  One of the nurses will contact you 24 hours after your treatment. Please let the nurse know about any problems that you may have experienced. Feel free to call the clinic you have any questions or concerns. The clinic phone number is [phone].   I have been informed and understand all the instructions given to me. I know to contact the clinic, my physician, or go to the Emergency Department if any problems should occur. I do not have any questions at this time, but understand that I may call the clinic during office hours   should I have any questions or need assistance in obtaining follow up care.    __________________________________________  _____________  __________ Signature of Patient or Authorized Representative            Date                   Time    __________________________________________ Nurse's Signature    

## 2012-01-01 NOTE — Progress Notes (Signed)
Patient in to clinic today for weekly paclitaxel dose #7. Based on lab results review and physical exam by Debbora Presto PA on 12/31/2011, patient condition is acceptable for treatment today. Sensory neuropathy has resolved to grade 1; as noted previously, patient will have dose reduction to 75% of original dose, or 60mg /m2 per protocol. Patient complains of side effects related to steroid pre-medication; per Debbora Presto, dexamethasone may be reduced from 20 mg to 10 mg, as allowed per protocol.  Lab results and toxicity reviewed with Dr. Donnie Coffin. Patient will have weekly CMET and physical exam for remaining treatments, due to prior toxicity.  Sign for infusion given to [facility] RN.

## 2012-01-08 ENCOUNTER — Ambulatory Visit (HOSPITAL_BASED_OUTPATIENT_CLINIC_OR_DEPARTMENT_OTHER): Payer: 59 | Admitting: Physician Assistant

## 2012-01-08 ENCOUNTER — Encounter: Payer: Self-pay | Admitting: *Deleted

## 2012-01-08 ENCOUNTER — Other Ambulatory Visit (HOSPITAL_BASED_OUTPATIENT_CLINIC_OR_DEPARTMENT_OTHER): Payer: 59 | Admitting: Lab

## 2012-01-08 ENCOUNTER — Encounter: Payer: Self-pay | Admitting: Physician Assistant

## 2012-01-08 ENCOUNTER — Ambulatory Visit (HOSPITAL_BASED_OUTPATIENT_CLINIC_OR_DEPARTMENT_OTHER): Payer: 59

## 2012-01-08 VITALS — BP 150/90 | HR 82 | Temp 98.4°F | Ht 67.0 in | Wt 193.1 lb

## 2012-01-08 VITALS — BP 111/56 | HR 67 | Temp 98.7°F

## 2012-01-08 DIAGNOSIS — C50919 Malignant neoplasm of unspecified site of unspecified female breast: Secondary | ICD-10-CM

## 2012-01-08 DIAGNOSIS — C50419 Malignant neoplasm of upper-outer quadrant of unspecified female breast: Secondary | ICD-10-CM

## 2012-01-08 DIAGNOSIS — Z5111 Encounter for antineoplastic chemotherapy: Secondary | ICD-10-CM

## 2012-01-08 DIAGNOSIS — Z170 Estrogen receptor positive status [ER+]: Secondary | ICD-10-CM

## 2012-01-08 LAB — COMPREHENSIVE METABOLIC PANEL
Albumin: 4.1 g/dL (ref 3.5–5.2)
CO2: 22 mEq/L (ref 19–32)
Calcium: 9.5 mg/dL (ref 8.4–10.5)
Chloride: 102 mEq/L (ref 96–112)
Glucose, Bld: 106 mg/dL — ABNORMAL HIGH (ref 70–99)
Potassium: 4 mEq/L (ref 3.5–5.3)
Sodium: 135 mEq/L (ref 135–145)
Total Protein: 7 g/dL (ref 6.0–8.3)

## 2012-01-08 LAB — CBC WITH DIFFERENTIAL/PLATELET
Basophils Absolute: 0.1 10*3/uL (ref 0.0–0.1)
Eosinophils Absolute: 0.2 10*3/uL (ref 0.0–0.5)
HCT: 34.6 % — ABNORMAL LOW (ref 34.8–46.6)
HGB: 12.4 g/dL (ref 11.6–15.9)
MCV: 95.6 fL (ref 79.5–101.0)
NEUT#: 4.3 10*3/uL (ref 1.5–6.5)
RDW: 13.3 % (ref 11.2–14.5)
lymph#: 0.9 10*3/uL (ref 0.9–3.3)

## 2012-01-08 MED ORDER — FAMOTIDINE IN NACL 20-0.9 MG/50ML-% IV SOLN
20.0000 mg | Freq: Once | INTRAVENOUS | Status: AC
  Administered 2012-01-08: 20 mg via INTRAVENOUS

## 2012-01-08 MED ORDER — HEPARIN SOD (PORK) LOCK FLUSH 100 UNIT/ML IV SOLN
500.0000 [IU] | Freq: Once | INTRAVENOUS | Status: AC | PRN
  Administered 2012-01-08: 500 [IU]
  Filled 2012-01-08: qty 5

## 2012-01-08 MED ORDER — SODIUM CHLORIDE 0.9 % IJ SOLN
10.0000 mL | INTRAMUSCULAR | Status: DC | PRN
  Administered 2012-01-08: 10 mL
  Filled 2012-01-08: qty 10

## 2012-01-08 MED ORDER — PACLITAXEL CHEMO INJECTION 300 MG/50ML
60.0000 mg/m2 | Freq: Once | INTRAVENOUS | Status: AC
  Administered 2012-01-08: 120 mg via INTRAVENOUS
  Filled 2012-01-08: qty 20

## 2012-01-08 MED ORDER — DIPHENHYDRAMINE HCL 50 MG/ML IJ SOLN
50.0000 mg | Freq: Once | INTRAMUSCULAR | Status: AC
  Administered 2012-01-08: 50 mg via INTRAVENOUS

## 2012-01-08 MED ORDER — DEXAMETHASONE SODIUM PHOSPHATE 10 MG/ML IJ SOLN
10.0000 mg | Freq: Once | INTRAMUSCULAR | Status: AC
  Administered 2012-01-08: 10 mg via INTRAVENOUS

## 2012-01-08 MED ORDER — SODIUM CHLORIDE 0.9 % IV SOLN
Freq: Once | INTRAVENOUS | Status: AC
  Administered 2012-01-08: 10:00:00 via INTRAVENOUS

## 2012-01-08 MED ORDER — ONDANSETRON 8 MG/50ML IVPB (CHCC)
8.0000 mg | Freq: Once | INTRAVENOUS | Status: AC
  Administered 2012-01-08: 8 mg via INTRAVENOUS

## 2012-01-08 NOTE — Progress Notes (Signed)
Hematology and Oncology Follow Up Visit  Emily Knight [id number] Dec 13, 1955 56 y.o. 01/08/12  HPI: Emily Knight is a 56 year old [place] woman with a history of a T2, N1, nuclear grade 2 invasive lobular carcinoma of the left breast for which she underwent a left lumpectomy with sentinel node dissection on 08/14/2011  revealing a 2.2 cm primary lesion with 2/3 sentinal nodes involved with metastatic disease with extracapsular extension. Tumor was noted to be ER/PR positive at 20/10% respectively, HER-2 negative, with a Ki-67 of 30%. She underwent reexcision of the left axillary lymph nodes on 08/28/2011 which showed 0 of 4 nodes involved. She is participating in NSABP B-49, arm one, having completed 4 cycles of adjuvant dose dense Adriamycin/Cytoxan, due for week 8/12 planned weekly Taxol.   Interim History:   Emily Knight is seen today prior to initiating week 8/12 planned Taxol.  Overall she is feeling well. She denies any unexplained fevers, chills, or night sweats. She does have hot flashes.  No heartburn exacerbation. She is not having any diarrhea or constipation issues. No dysuria symptoms. She denies profound fatigue.  No bleeding or bruising symptoms.  Emily Knight states the "numbness" in the oral region has abated.  She still notes slight sensory change in her fingertips, but her ADLs not affected at this time. A detailed review of systems is otherwise noncontributory as noted below.  Review of Systems: Constitutional:  no weight loss, fever, night sweats and feels well Eyes: uses glasses ENT: no complaints Cardiovascular: no chest pain or dyspnea on exertion Respiratory: no cough, shortness of breath, or wheezing Neurological: no TIA or stroke symptoms Dermatological: negative Gastrointestinal: no abdominal pain, change in bowel habits, or black or bloody stools Genito-Urinary: no dysuria, trouble voiding, or hematuria Hematological and Lymphatic: negative Breast: negative for  breast lumps Musculoskeletal: negative Remaining ROS negative.   Medications:   I have reviewed the patient's current medications.  Current Outpatient Prescriptions  Medication Sig Dispense Refill  . lansoprazole (PREVACID) 15 MG capsule Take 15 mg by mouth 2 (two) times daily.      Marland Kitchen lidocaine-prilocaine (EMLA) cream Apply 1 application topically as needed.      Marland Kitchen LORazepam (ATIVAN) 0.5 MG tablet Take 0.5 mg by mouth every 8 (eight) hours.      . ondansetron (ZOFRAN) 8 MG tablet Take 8 mg by mouth every 8 (eight) hours as needed.      . prochlorperazine (COMPAZINE) 10 MG tablet Take 1 tablet (10 mg total) by mouth every 6 (six) hours as needed. 1 tab every 6 hours as needed for breakthrough nausea  30 tablet  2  . valACYclovir (VALTREX) 500 MG tablet Take 500 mg by mouth 2 (two) times daily. Per patient on 12/04/2011, stopped taking about one week ago.       No current facility-administered medications for this visit.   Facility-Administered Medications Ordered in Other Visits  Medication Dose Route Frequency Provider Last Rate Last Dose  . lidocaine-prilocaine (EMLA) cream   Topical Once Pierce Crane, MD        Allergies:  Allergies  Allergen Reactions  . Adhesive (Tape) Other (See Comments)    BLISTERS  . Codeine Nausea And Vomiting    Physical Exam: Filed Vitals:   01/08/12 0902  BP: 150/90  Pulse: 82  Temp: 98.4 F (36.9 C)    Body mass index is 30.24 kg/(m^2). Weight: 193 lbs. HEENT:  Sclerae anicteric, conjunctivae pink. Oropharynx does reveal evidence of an also or on the  right lateral tongue region. Minimal erythema surrounding. No oral mucositis otherwise or candidiasis. Nodes:  No cervical, supraclavicular, or axillary lymphadenopathy palpated.  Breast Exam:  Deferred.   Lungs:  Clear to auscultation bilaterally.  No crackles, rhonchi, or wheezes.   Heart:  Regular rate and rhythm.   Abdomen:  Soft, nontender.  Positive bowel sounds.  No organomegaly or masses  palpated.   Musculoskeletal:  No focal spinal tenderness to palpation.  Extremities:  Benign.  No peripheral edema or cyanosis.   Skin:  Benign.   Neuro:  Nonfocal, alert and oriented x 3, of note strength is intact.   Lab Results: Lab Results  Component Value Date   WBC 5.8 01/08/2012   HGB 12.4 01/08/2012   HCT 34.6* 01/08/2012   MCV 95.6 01/08/2012   PLT 351 01/08/2012   NEUTROABS 4.3 01/08/2012     Chemistry      Component Value Date/Time   NA 135 01/08/2012 0813   K 4.0 01/08/2012 0813   CL 102 01/08/2012 0813   CO2 22 01/08/2012 0813   BUN 18 01/08/2012 0813   CREATININE 0.84 01/08/2012 0813      Component Value Date/Time   CALCIUM 9.5 01/08/2012 0813   ALKPHOS 58 01/08/2012 0813   AST 38* 01/08/2012 0813   ALT 69* 01/08/2012 0813   BILITOT 0.4 01/08/2012 0813      Lab Results  Component Value Date   LABCA2 27 09/05/2011    Radiological Studies: 11/02/11 COMPLETE ABDOMINAL ULTRASOUND  Comparison: CT 09/04/2011  Findings:  Gallbladder: No gallstones, gallbladder wall thickening, or  pericholecystic fluid.  Common bile duct: Normal caliber, 4 mm.  Liver: Slight increased echotexture throughout the liver which may  reflect fatty infiltration. No biliary dilatation or focal  abnormality.  IVC: Appears normal.  Pancreas: No focal abnormality seen.  Spleen: Within normal limits in size and echotexture.  Right Kidney: Normal in size and parenchymal echogenicity. No  evidence of mass or hydronephrosis. Simple appearing 1.6 cm cyst in  the mid pole.  Left Kidney: Normal in size and parenchymal echogenicity. No  evidence of mass or hydronephrosis.  Abdominal aorta: No aneurysm identified.  IMPRESSION:  Suspect mild fatty infiltration of the liver. No evidence for  gallbladder disease.  Original Report Authenticated By: Cyndie Chime, M.D.     Assessment:  Emily Knight is a 56 year old [place] woman with a history of a T2, N1, nuclear grade 2 invasive  lobular carcinoma of the left breast for which she underwent a left lumpectomy with sentinel node dissection on 08/14/2011  revealing a 2.2 cm primary lesion with 2/3 sentinal nodes involved with metastatic disease with extracapsular extension. Tumor was noted to be ER/PR positive at 20/10% respectively, HER-2 negative, with a Ki-67 of 30%. She underwent reexcision of the left axillary lymph nodes on 08/28/2011 which showed 0 of 4 nodes involved. She is participating in NSABP B-49, arm one, having completed 4 cycles of adjuvant dose dense Adriamycin/Cytoxan, due for week 8/12 planned weekly Taxol.  2. History of grade 2 loose stools which have completely normalized, and felt to be secondary to dietary changes.  3. History of herpes simplex on Valtrex 500mg  orally a day.  4. 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 Grade 1 peripheral neuropathy, on vitamin B-complex daily.   ECOG: 0  Case reviewed with Dr. Pierce Crane.    Plan:  Tijah will receive week 8 of 12 planned adjuvant Taxol today as scheduled, though at a 25%  dose reduction.   We will regroup in one week's time for followup prior to week 9. Laurelai knows to contact us in the interim if the need should arise.  Donnia Poplaski T, PA-C 01/08/12

## 2012-01-08 NOTE — Patient Instructions (Signed)
[facility] Discharge Instructions for Patients Receiving Chemotherapy  Today you received the following chemotherapy agents taxol  To help prevent nausea and vomiting after your treatment, we encourage you to take your nausea medication  .   If you develop nausea and vomiting that is not controlled by your nausea medication, call the clinic. If it is after clinic hours your family physician or the after hours number for the clinic or go to the Emergency Department.   BELOW ARE SYMPTOMS THAT SHOULD BE REPORTED IMMEDIATELY:  *FEVER GREATER THAN 100.5 F  *CHILLS WITH OR WITHOUT FEVER  NAUSEA AND VOMITING THAT IS NOT CONTROLLED WITH YOUR NAUSEA MEDICATION  *UNUSUAL SHORTNESS OF BREATH  *UNUSUAL BRUISING OR BLEEDING  TENDERNESS IN MOUTH AND THROAT WITH OR WITHOUT PRESENCE OF ULCERS  *URINARY PROBLEMS  *BOWEL PROBLEMS  UNUSUAL RASH Items with * indicate a potential emergency and should be followed up as soon as possible.  One of the nurses will contact you 24 hours after your treatment. Please let the nurse know about any problems that you may have experienced. Feel free to call the clinic you have any questions or concerns. The clinic phone number is [phone].   I have been informed and understand all the instructions given to me. I know to contact the clinic, my physician, or go to the Emergency Department if any problems should occur. I do not have any questions at this time, but understand that I may call the clinic during office hours   should I have any questions or need assistance in obtaining follow up care.    __________________________________________  _____________  __________ Signature of Patient or Authorized Representative            Date                   Time    __________________________________________ Nurse's Signature

## 2012-01-08 NOTE — Progress Notes (Signed)
Patient in to clinic today for evaluation and treatment with weekly paclitaxel. She denies any circumoral neuropathy, and reports continued mild sensory neuropathy of fingertips and feet, not interfering with function. Based on lab results review and physical exam by Debbora Presto PA, patient condition is acceptable for treatment today. Sign for infusion given to [facility] RN.

## 2012-01-09 ENCOUNTER — Other Ambulatory Visit: Payer: Self-pay | Admitting: Certified Registered Nurse Anesthetist

## 2012-01-09 NOTE — Progress Notes (Signed)
Stop time confirmed with infusion nurse.  Stop time entered and encounter closed. HL

## 2012-01-14 ENCOUNTER — Telehealth: Payer: Self-pay | Admitting: *Deleted

## 2012-01-14 ENCOUNTER — Other Ambulatory Visit: Payer: Self-pay | Admitting: *Deleted

## 2012-01-14 NOTE — Telephone Encounter (Signed)
Pt c/o sore throat. Per MD, pt is to use OTC cloraseptic. Pt will be seen by MD with labs 01/14/12

## 2012-01-15 ENCOUNTER — Telehealth: Payer: Self-pay | Admitting: *Deleted

## 2012-01-15 ENCOUNTER — Encounter: Payer: Self-pay | Admitting: *Deleted

## 2012-01-15 ENCOUNTER — Other Ambulatory Visit (HOSPITAL_BASED_OUTPATIENT_CLINIC_OR_DEPARTMENT_OTHER): Payer: 59 | Admitting: Lab

## 2012-01-15 ENCOUNTER — Ambulatory Visit (HOSPITAL_BASED_OUTPATIENT_CLINIC_OR_DEPARTMENT_OTHER): Payer: 59

## 2012-01-15 ENCOUNTER — Ambulatory Visit: Payer: 59 | Admitting: Physician Assistant

## 2012-01-15 ENCOUNTER — Ambulatory Visit (HOSPITAL_BASED_OUTPATIENT_CLINIC_OR_DEPARTMENT_OTHER): Payer: 59 | Admitting: Oncology

## 2012-01-15 VITALS — BP 133/87 | HR 96 | Temp 98.6°F | Ht 67.0 in | Wt 193.6 lb

## 2012-01-15 DIAGNOSIS — C50919 Malignant neoplasm of unspecified site of unspecified female breast: Secondary | ICD-10-CM

## 2012-01-15 DIAGNOSIS — C50419 Malignant neoplasm of upper-outer quadrant of unspecified female breast: Secondary | ICD-10-CM

## 2012-01-15 DIAGNOSIS — Z170 Estrogen receptor positive status [ER+]: Secondary | ICD-10-CM

## 2012-01-15 DIAGNOSIS — Z5111 Encounter for antineoplastic chemotherapy: Secondary | ICD-10-CM

## 2012-01-15 LAB — CBC WITH DIFFERENTIAL/PLATELET
Eosinophils Absolute: 0.1 10*3/uL (ref 0.0–0.5)
HCT: 36.6 % (ref 34.8–46.6)
LYMPH%: 9.4 % — ABNORMAL LOW (ref 14.0–49.7)
MCV: 100.5 fL (ref 79.5–101.0)
MONO#: 0.4 10*3/uL (ref 0.1–0.9)
MONO%: 7.1 % (ref 0.0–14.0)
NEUT#: 4.8 10*3/uL (ref 1.5–6.5)
NEUT%: 80.3 % — ABNORMAL HIGH (ref 38.4–76.8)
Platelets: 282 10*3/uL (ref 145–400)
WBC: 5.9 10*3/uL (ref 3.9–10.3)

## 2012-01-15 LAB — COMPREHENSIVE METABOLIC PANEL
ALT: 55 U/L — ABNORMAL HIGH (ref 0–35)
Alkaline Phosphatase: 61 U/L (ref 39–117)
Chloride: 102 mEq/L (ref 96–112)
Creatinine, Ser: 0.76 mg/dL (ref 0.50–1.10)
Potassium: 4 mEq/L (ref 3.5–5.3)
Total Bilirubin: 0.4 mg/dL (ref 0.3–1.2)

## 2012-01-15 LAB — RAPID STREP SCREEN (MED CTR MEBANE ONLY): Streptococcus, Group A Screen (Direct): NEGATIVE

## 2012-01-15 MED ORDER — HEPARIN SOD (PORK) LOCK FLUSH 100 UNIT/ML IV SOLN
500.0000 [IU] | Freq: Once | INTRAVENOUS | Status: AC | PRN
  Administered 2012-01-15: 500 [IU]
  Filled 2012-01-15: qty 5

## 2012-01-15 MED ORDER — PACLITAXEL CHEMO INJECTION 300 MG/50ML
60.0000 mg/m2 | Freq: Once | INTRAVENOUS | Status: AC
  Administered 2012-01-15: 120 mg via INTRAVENOUS
  Filled 2012-01-15: qty 20

## 2012-01-15 MED ORDER — ONDANSETRON 8 MG/50ML IVPB (CHCC)
8.0000 mg | Freq: Once | INTRAVENOUS | Status: AC
  Administered 2012-01-15: 8 mg via INTRAVENOUS

## 2012-01-15 MED ORDER — DEXAMETHASONE SODIUM PHOSPHATE 10 MG/ML IJ SOLN
10.0000 mg | Freq: Once | INTRAMUSCULAR | Status: AC
  Administered 2012-01-15: 10 mg via INTRAVENOUS

## 2012-01-15 MED ORDER — SODIUM CHLORIDE 0.9 % IV SOLN
Freq: Once | INTRAVENOUS | Status: AC
  Administered 2012-01-15: 11:00:00 via INTRAVENOUS

## 2012-01-15 MED ORDER — AZITHROMYCIN 1 G PO PACK: 1.0000 | PACK | Freq: Once | ORAL | Status: AC

## 2012-01-15 MED ORDER — DIPHENHYDRAMINE HCL 50 MG/ML IJ SOLN
50.0000 mg | Freq: Once | INTRAMUSCULAR | Status: AC
  Administered 2012-01-15: 50 mg via INTRAVENOUS

## 2012-01-15 MED ORDER — SODIUM CHLORIDE 0.9 % IJ SOLN
10.0000 mL | INTRAMUSCULAR | Status: DC | PRN
  Administered 2012-01-15: 10 mL
  Filled 2012-01-15: qty 10

## 2012-01-15 MED ORDER — FAMOTIDINE IN NACL 20-0.9 MG/50ML-% IV SOLN
20.0000 mg | Freq: Once | INTRAVENOUS | Status: AC
  Administered 2012-01-15: 20 mg via INTRAVENOUS

## 2012-01-15 NOTE — Patient Instructions (Addendum)
[facility] Discharge Instructions for Patients Receiving Chemotherapy  Today you received the following chemotherapy agents:  Taxol  To help prevent nausea and vomiting after your treatment, we encourage you to take your nausea medication  As instructed by your physician, and take meds as needed for nausea.    If you develop nausea and vomiting that is not controlled by your nausea medication, call the clinic. If it is after clinic hours your family physician or the after hours number for the clinic or go to the Emergency Department.   BELOW ARE SYMPTOMS THAT SHOULD BE REPORTED IMMEDIATELY:  *FEVER GREATER THAN 100.5 F  *CHILLS WITH OR WITHOUT FEVER  NAUSEA AND VOMITING THAT IS NOT CONTROLLED WITH YOUR NAUSEA MEDICATION  *UNUSUAL SHORTNESS OF BREATH  *UNUSUAL BRUISING OR BLEEDING  TENDERNESS IN MOUTH AND THROAT WITH OR WITHOUT PRESENCE OF ULCERS  *URINARY PROBLEMS  *BOWEL PROBLEMS  UNUSUAL RASH Items with * indicate a potential emergency and should be followed up as soon as possible.  One of the nurses will contact you 24 hours after your treatment. Please let the nurse know about any problems that you may have experienced. Feel free to call the clinic you have any questions or concerns. The clinic phone number is [phone].   I have been informed and understand all the instructions given to me. I know to contact the clinic, my physician, or go to the Emergency Department if any problems should occur. I do not have any questions at this time, but understand that I may call the clinic during office hours   should I have any questions or need assistance in obtaining follow up care.    __________________________________________  _____________  __________ Signature of Patient or Authorized Representative            Date                   Time    __________________________________________ Nurse's Signature

## 2012-01-15 NOTE — Progress Notes (Signed)
Patient in to clinic today for evaluation prior to receiving weekly paclitaxel dose #9. She reports that her neuropathy is essentially unchanged, grade 1, not interfering with ADLs. She notes some intermittent circumoral paresthesias over the last week which she has not felt for more than 24 hours (less than 7 days duration). She continues to take vitamin B complex at the labeled dose once daily, per Dr. Donnie Coffin, patient may increase dose to one tablet twice daily.  Patient reports onset of sore throat over the last couple of days, felt to possibly be related to sinus drainage per Dr. Donnie Coffin, however, throat swab was obtained and will start antibiotics empirically. Based on assessment and lab results review by Dr. Donnie Coffin today, patient condition is acceptable for treatment. Sign for infusion given to Normajean Baxter RN.

## 2012-01-15 NOTE — Telephone Encounter (Signed)
Per staff message from research nurse, I have scheduled appt.  JMW

## 2012-01-15 NOTE — Progress Notes (Signed)
Hematology and Oncology Follow Up Visit  MACKENZIE GROOM [id number] Jan 28, 1956 56 y.o. 01/08/12  HPI: Ms. Angevine is a 56 year old [place] woman with a history of a T2, N1, nuclear grade 2 invasive lobular carcinoma of the left breast for which she underwent a left lumpectomy with sentinel node dissection on 08/14/2011  revealing a 2.2 cm primary lesion with 2/3 sentinal nodes involved with metastatic disease with extracapsular extension. Tumor was noted to be ER/PR positive at 20/10% respectively, HER-2 negative, with a Ki-67 of 30%. She underwent reexcision of the left axillary lymph nodes on 08/28/2011 which showed 0 of 4 nodes involved. She is participating in NSABP B-49, arm one, having completed 4 cycles of adjuvant dose dense Adriamycin/Cytoxan, due for week 9/12 planned weekly Taxol.   Interim History:   Minta is seen today prior to initiating week 9/12 planned Taxol.  Overall she is feeling well. She continues to have  numbness around her mouth. She is taking B complex vitamin which seems to be helping so the numbness is less persistent and more intermittent. She has a little bit of numbness tingling the tips of her fingers. She does not notice any functional impairment. She is otherwise doing well. She is also having a sore throat has been taking topical medicines for this. She denies fever cough she has no real sinus drainage.  Review of Systems: Constitutional:  no weight loss, fever, night sweats and feels well Eyes: uses glasses ENT: no complaints Cardiovascular: no chest pain or dyspnea on exertion Respiratory: no cough, shortness of breath, or wheezing Neurological: no TIA or stroke symptoms Dermatological: negative Gastrointestinal: no abdominal pain, change in bowel habits, or black or bloody stools Genito-Urinary: no dysuria, trouble voiding, or hematuria Hematological and Lymphatic: negative Breast: negative for breast lumps Musculoskeletal:  negative Remaining ROS negative.   Medications:   I have reviewed the patient's current medications.  Current Outpatient Prescriptions  Medication Sig Dispense Refill  . lansoprazole (PREVACID) 15 MG capsule Take 15 mg by mouth 2 (two) times daily.      Marland Kitchen lidocaine-prilocaine (EMLA) cream Apply 1 application topically as needed.      Marland Kitchen LORazepam (ATIVAN) 0.5 MG tablet Take 0.5 mg by mouth every 8 (eight) hours.      . ondansetron (ZOFRAN) 8 MG tablet Take 8 mg by mouth every 8 (eight) hours as needed.      . prochlorperazine (COMPAZINE) 10 MG tablet Take 1 tablet (10 mg total) by mouth every 6 (six) hours as needed. 1 tab every 6 hours as needed for breakthrough nausea  30 tablet  2  . valACYclovir (VALTREX) 500 MG tablet Take 500 mg by mouth 2 (two) times daily. Per patient on 12/04/2011, stopped taking about one week ago.      . ciprofloxacin (CIPRO) 500 MG tablet Take 500 mg by mouth 2 (two) times daily.       No current facility-administered medications for this visit.   Facility-Administered Medications Ordered in Other Visits  Medication Dose Route Frequency Provider Last Rate Last Dose  . lidocaine-prilocaine (EMLA) cream   Topical Once Pierce Crane, MD        Allergies:  Allergies  Allergen Reactions  . Adhesive (Tape) Other (See Comments)    BLISTERS  . Codeine Nausea And Vomiting    Physical Exam: Filed Vitals:   01/15/12 0933  BP: 133/87  Pulse: 96  Temp: 98.6 F (37 C)    Body mass index is 30.32 kg/(m^2). Weight:  193 lbs. HEENT:  Sclerae anicteric, conjunctivae pink. Oropharynx does reveal evidence of an also or on the right lateral tongue region. Minimal erythema surrounding. No oral mucositis otherwise or candidiasis. Posterior pharynx may be a little bit red. Nodes:  No cervical, supraclavicular, or axillary lymphadenopathy palpated.  Breast Exam:  Deferred.   Lungs:  Clear to auscultation bilaterally.  No crackles, rhonchi, or wheezes.   Heart:  Regular  rate and rhythm.   Abdomen:  Soft, nontender.  Positive bowel sounds.  No organomegaly or masses palpated.   Musculoskeletal:  No focal spinal tenderness to palpation.  Extremities:  Benign.  No peripheral edema or cyanosis.   Skin:  Benign.   Neuro:  Nonfocal, alert and oriented x 3, of note strength is intact.   Lab Results: Lab Results  Component Value Date   WBC 5.9 01/15/2012   HGB 12.9 01/15/2012   HCT 36.6 01/15/2012   MCV 100.5 01/15/2012   PLT 282 01/15/2012   NEUTROABS 4.8 01/15/2012     Chemistry      Component Value Date/Time   NA 135 01/08/2012 0813   K 4.0 01/08/2012 0813   CL 102 01/08/2012 0813   CO2 22 01/08/2012 0813   BUN 18 01/08/2012 0813   CREATININE 0.84 01/08/2012 0813      Component Value Date/Time   CALCIUM 9.5 01/08/2012 0813   ALKPHOS 58 01/08/2012 0813   AST 38* 01/08/2012 0813   ALT 69* 01/08/2012 0813   BILITOT 0.4 01/08/2012 0813      Lab Results  Component Value Date   LABCA2 27 09/05/2011    Radiological Studies: 11/02/11 COMPLETE ABDOMINAL ULTRASOUND  Comparison: CT 09/04/2011  Findings:  Gallbladder: No gallstones, gallbladder wall thickening, or  pericholecystic fluid.  Common bile duct: Normal caliber, 4 mm.  Liver: Slight increased echotexture throughout the liver which may  reflect fatty infiltration. No biliary dilatation or focal  abnormality.  IVC: Appears normal.  Pancreas: No focal abnormality seen.  Spleen: Within normal limits in size and echotexture.  Right Kidney: Normal in size and parenchymal echogenicity. No  evidence of mass or hydronephrosis. Simple appearing 1.6 cm cyst in  the mid pole.  Left Kidney: Normal in size and parenchymal echogenicity. No  evidence of mass or hydronephrosis.  Abdominal aorta: No aneurysm identified.  IMPRESSION:  Suspect mild fatty infiltration of the liver. No evidence for  gallbladder disease.  Original Report Authenticated By: Cyndie Chime, M.D.     Assessment:  Ms. Schwalbe is a  56 year old [place] woman with a history of a T2, N1, nuclear grade 2 invasive lobular carcinoma of the left breast for which she underwent a left lumpectomy with sentinel node dissection on 08/14/2011  revealing a 2.2 cm primary lesion with 2/3 sentinal nodes involved with metastatic disease with extracapsular extension. Tumor was noted to be ER/PR positive at 20/10% respectively, HER-2 negative, with a Ki-67 of 30%. She underwent reexcision of the left axillary lymph nodes on 08/28/2011 which showed 0 of 4 nodes involved. She is participating in NSABP B-49, arm one, having completed 4 cycles of adjuvant dose dense Adriamycin/Cytoxan, due for week 9/12 planned weekly Taxol. Dose of taxol  be given today. Her her dose will be kept at the lower dose. We will see her next week. I have done some feels well today. I have started on a Z-Pak empirically. Of note is that her LFTs are also somewhat improved.  2. History of grade 2 loose stools  which have completely normalized, and felt to be secondary to dietary changes.  3. History of herpes simplex on Valtrex 500mg  orally a day.  4. 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 Grade 1 peripheral neuropathy, on vitamin B-complex daily.   ECOG: 0  Case reviewed with Dr. Pierce Crane.    Plan:  S[place]avia will receive week 9 of 12 planned adjuvant Taxol today as scheduled, though at a 25% dose reduction.   We will regroup in one week's time for followup prior to week 9. [place] knows to contact us in the interim if the need should arise.  Pecolia Marando, md 01/08/12

## 2012-01-17 ENCOUNTER — Telehealth: Payer: Self-pay | Admitting: Emergency Medicine

## 2012-01-17 LAB — THROAT CULTURE

## 2012-01-17 NOTE — Telephone Encounter (Signed)
Patient states she is still having a sore throat and chest congestion. Patient denies fever. Left message with patient encouraging her to drink plenty of fluids and to try over the counter Mucinex for the congestion. Patient advised to call back if she has any other questions or if she develops a fever.

## 2012-01-22 ENCOUNTER — Ambulatory Visit (HOSPITAL_BASED_OUTPATIENT_CLINIC_OR_DEPARTMENT_OTHER): Payer: 59 | Admitting: Oncology

## 2012-01-22 ENCOUNTER — Ambulatory Visit (HOSPITAL_BASED_OUTPATIENT_CLINIC_OR_DEPARTMENT_OTHER): Payer: 59

## 2012-01-22 ENCOUNTER — Other Ambulatory Visit (HOSPITAL_BASED_OUTPATIENT_CLINIC_OR_DEPARTMENT_OTHER): Payer: 59 | Admitting: Lab

## 2012-01-22 ENCOUNTER — Encounter: Payer: 59 | Admitting: *Deleted

## 2012-01-22 ENCOUNTER — Telehealth: Payer: Self-pay | Admitting: Oncology

## 2012-01-22 VITALS — BP 141/89 | HR 86 | Temp 98.1°F | Resp 20 | Ht 67.0 in | Wt 194.9 lb

## 2012-01-22 DIAGNOSIS — C50419 Malignant neoplasm of upper-outer quadrant of unspecified female breast: Secondary | ICD-10-CM

## 2012-01-22 DIAGNOSIS — G609 Hereditary and idiopathic neuropathy, unspecified: Secondary | ICD-10-CM

## 2012-01-22 DIAGNOSIS — B009 Herpesviral infection, unspecified: Secondary | ICD-10-CM

## 2012-01-22 DIAGNOSIS — Z5111 Encounter for antineoplastic chemotherapy: Secondary | ICD-10-CM

## 2012-01-22 DIAGNOSIS — C50919 Malignant neoplasm of unspecified site of unspecified female breast: Secondary | ICD-10-CM

## 2012-01-22 LAB — COMPREHENSIVE METABOLIC PANEL
ALT: 59 U/L — ABNORMAL HIGH (ref 0–35)
Albumin: 4 g/dL (ref 3.5–5.2)
Calcium: 9.7 mg/dL (ref 8.4–10.5)
Glucose, Bld: 107 mg/dL — ABNORMAL HIGH (ref 70–99)
Total Protein: 7 g/dL (ref 6.0–8.3)

## 2012-01-22 LAB — CBC WITH DIFFERENTIAL/PLATELET
BASO%: 1 % (ref 0.0–2.0)
Eosinophils Absolute: 0.1 10*3/uL (ref 0.0–0.5)
MCHC: 35.4 g/dL (ref 31.5–36.0)
MONO#: 0.2 10*3/uL (ref 0.1–0.9)
NEUT#: 2.9 10*3/uL (ref 1.5–6.5)
RBC: 3.74 10*6/uL (ref 3.70–5.45)
RDW: 13.8 % (ref 11.2–14.5)
WBC: 4.2 10*3/uL (ref 3.9–10.3)

## 2012-01-22 MED ORDER — SODIUM CHLORIDE 0.9 % IJ SOLN
10.0000 mL | INTRAMUSCULAR | Status: DC | PRN
  Administered 2012-01-22: 10 mL
  Filled 2012-01-22: qty 10

## 2012-01-22 MED ORDER — ONDANSETRON 8 MG/50ML IVPB (CHCC)
8.0000 mg | Freq: Once | INTRAVENOUS | Status: AC
  Administered 2012-01-22: 8 mg via INTRAVENOUS

## 2012-01-22 MED ORDER — DEXAMETHASONE SODIUM PHOSPHATE 10 MG/ML IJ SOLN
10.0000 mg | Freq: Once | INTRAMUSCULAR | Status: AC
  Administered 2012-01-22: 10 mg via INTRAVENOUS

## 2012-01-22 MED ORDER — PACLITAXEL CHEMO INJECTION 300 MG/50ML
60.0000 mg/m2 | Freq: Once | INTRAVENOUS | Status: AC
  Administered 2012-01-22: 120 mg via INTRAVENOUS
  Filled 2012-01-22: qty 20

## 2012-01-22 MED ORDER — SODIUM CHLORIDE 0.9 % IV SOLN
Freq: Once | INTRAVENOUS | Status: AC
  Administered 2012-01-22: 11:00:00 via INTRAVENOUS

## 2012-01-22 MED ORDER — FAMOTIDINE IN NACL 20-0.9 MG/50ML-% IV SOLN
20.0000 mg | Freq: Once | INTRAVENOUS | Status: AC
  Administered 2012-01-22: 20 mg via INTRAVENOUS

## 2012-01-22 MED ORDER — HEPARIN SOD (PORK) LOCK FLUSH 100 UNIT/ML IV SOLN
500.0000 [IU] | Freq: Once | INTRAVENOUS | Status: AC | PRN
  Administered 2012-01-22: 500 [IU]
  Filled 2012-01-22: qty 5

## 2012-01-22 MED ORDER — DIPHENHYDRAMINE HCL 50 MG/ML IJ SOLN
50.0000 mg | Freq: Once | INTRAMUSCULAR | Status: AC
  Administered 2012-01-22: 50 mg via INTRAVENOUS

## 2012-01-22 NOTE — Progress Notes (Signed)
Patient in to clinic this morning with friend, Emily Knight, for evaluation and weekly paclitaxel #10 treatment. She reports that neuropathy symptoms in hands have stabilized since increasing vitamin B complex dose to twice daily last week. She denies any neuropathic symptoms in her face or around the mouth. She continues to notice abnormal sensation in soles of feet "as if walking on pillows" but this does not impact her function. She has no effect on her ADLs related to neuropathy. She reports some tenderness of nail beds (hands), without visible nail changes.  Patient inquired about repeat right breast ultrasound, noting that these had been performed at [facility] every six months with cystic changes evident in the breast. Last evaluation was at baseline for study enrollment, in February 2013. Per Dr. Donnie Coffin, will defer additional right breast imaging at this time, based on negative findings on right breast MRI at baseline. Patient is in agreement with plan.  Sign for infusion given to Ocie Bob RN.

## 2012-01-22 NOTE — Patient Instructions (Addendum)
[facility] Discharge Instructions for Patients Receiving Chemotherapy  Today you received the following chemotherapy agents Taxol  To help prevent nausea and vomiting after your treatment, we encourage you to take your nausea medication    If you develop nausea and vomiting that is not controlled by your nausea medication, call the clinic.   BELOW ARE SYMPTOMS THAT SHOULD BE REPORTED IMMEDIATELY:  *FEVER GREATER THAN 100.5 F  *CHILLS WITH OR WITHOUT FEVER  NAUSEA AND VOMITING THAT IS NOT CONTROLLED WITH YOUR NAUSEA MEDICATION  *UNUSUAL SHORTNESS OF BREATH  *UNUSUAL BRUISING OR BLEEDING  TENDERNESS IN MOUTH AND THROAT WITH OR WITHOUT PRESENCE OF ULCERS  *URINARY PROBLEMS  *BOWEL PROBLEMS  UNUSUAL RASH Items with * indicate a potential emergency and should be followed up as soon as possible.  Feel free to call the clinic you have any questions or concerns. The clinic phone number is [phone].    

## 2012-01-22 NOTE — Progress Notes (Signed)
Hematology and Oncology Follow Up Visit  Emily Knight [id number] [birth year] 56 y.o. 01/08/12  HPI: [name] is a 57 year old [place] woman with a history of a T2, N1, nuclear grade 2 invasive lobular carcinoma of the left breast for which she underwent a left lumpectomy with sentinel node dissection on 08/14/2011  revealing a 2.2 cm primary lesion with 2/3 sentinal nodes involved with metastatic disease with extracapsular extension. Tumor was noted to be ER/PR positive at 20/10% respectively, HER-2 negative, with a Ki-67 of 30%. She underwent reexcision of the left axillary lymph nodes on 08/28/2011 which showed 0 of 4 nodes involved. She is participating in NSABP B-49, arm one, having completed 4 cycles of adjuvant dose dense Adriamycin/Cytoxan, due for week 10/12 planned weekly Taxol.   Interim History:   Patient returns for followup. I haven't seen last week she was given a dose of azithromycin. She continues to have been congested nasal discharge which is yellow-green but no fever no tenderness in her face. She does have a prior B complex vitamin and numbness in her face is improved. She has minimal if any numbness tingling in the tips of her toes and fingers. She feels otherwise good. She has no other complaints. She is anxious to complete chemotherapy and get started on radiation. Of note is that her swabs were negative.  Review of Systems: Constitutional:  no weight loss, fever, night sweats and feels well Eyes: uses glasses ENT: no complaints Cardiovascular: no chest pain or dyspnea on exertion Respiratory: no cough, shortness of breath, or wheezing Neurological: no TIA or stroke symptoms Dermatological: negative Gastrointestinal: no abdominal pain, change in bowel habits, or black or bloody stools Genito-Urinary: no dysuria, trouble voiding, or hematuria Hematological and Lymphatic: negative Breast: negative for breast lumps Musculoskeletal: negative Remaining  ROS negative.   Medications:   I have reviewed the patient's current medications.  Current Outpatient Prescriptions  Medication Sig Dispense Refill  . lansoprazole (PREVACID) 15 MG capsule Take 15 mg by mouth 2 (two) times daily.      Marland Kitchen lidocaine-prilocaine (EMLA) cream Apply 1 application topically as needed.      Marland Kitchen LORazepam (ATIVAN) 0.5 MG tablet Take 0.5 mg by mouth every 8 (eight) hours.      . ondansetron (ZOFRAN) 8 MG tablet Take 8 mg by mouth every 8 (eight) hours as needed.      . prochlorperazine (COMPAZINE) 10 MG tablet Take 1 tablet (10 mg total) by mouth every 6 (six) hours as needed. 1 tab every 6 hours as needed for breakthrough nausea  30 tablet  2  . valACYclovir (VALTREX) 500 MG tablet Take 500 mg by mouth 2 (two) times daily. Per patient on 12/04/2011, stopped taking about one week ago.      Marland Kitchen azithromycin (ZITHROMAX) 1 G powder Take 1 packet by mouth once.  1 each  0  . ciprofloxacin (CIPRO) 500 MG tablet Take 500 mg by mouth 2 (two) times daily.       No current facility-administered medications for this visit.   Facility-Administered Medications Ordered in Other Visits  Medication Dose Route Frequency Provider Last Rate Last Dose  . lidocaine-prilocaine (EMLA) cream   Topical Once Pierce Crane, MD        Allergies:  Allergies  Allergen Reactions  . Adhesive (Tape) Other (See Comments)    BLISTERS  . Codeine Nausea And Vomiting    Physical Exam: Filed Vitals:   01/22/12 0950  BP: 141/89  Pulse: 86  Temp: 98.1 F (36.7 C)  Resp: 20    Body mass index is 30.53 kg/(m^2). Weight: 193 lbs. HEENT:  Sclerae anicteric, conjunctivae pink. Oropharynx does reveal evidence of an also or on the right lateral tongue region. Minimal erythema surrounding. No oral mucositis otherwise or candidiasis. Posterior pharynx may be a little bit red. Nodes:  No cervical, supraclavicular, or axillary lymphadenopathy palpated.  Breast Exam:  Deferred.   Lungs:  Clear to  auscultation bilaterally.  No crackles, rhonchi, or wheezes.   Heart:  Regular rate and rhythm.   Abdomen:  Soft, nontender.  Positive bowel sounds.  No organomegaly or masses palpated.   Musculoskeletal:  No focal spinal tenderness to palpation.  Extremities:  Benign.  No peripheral edema or cyanosis.   Skin:  Benign.   Neuro:  Nonfocal, alert and oriented x 3, of note strength is intact.   Lab Results: Lab Results  Component Value Date   WBC 4.2 01/22/2012   HGB 13.3 01/22/2012   HCT 37.7 01/22/2012   MCV 100.7 01/22/2012   PLT 369 01/22/2012   NEUTROABS 2.9 01/22/2012     Chemistry      Component Value Date/Time   NA 139 01/22/2012 0927   K 4.1 01/22/2012 0927   CL 105 01/22/2012 0927   CO2 24 01/22/2012 0927   BUN 14 01/22/2012 0927   CREATININE 0.71 01/22/2012 0927      Component Value Date/Time   CALCIUM 9.7 01/22/2012 0927   ALKPHOS 61 01/22/2012 0927   AST 36 01/22/2012 0927   ALT 59* 01/22/2012 0927   BILITOT 0.3 01/22/2012 0927      Lab Results  Component Value Date   LABCA2 27 09/05/2011    Radiological Studies: 11/02/11 COMPLETE ABDOMINAL ULTRASOUND  Comparison: CT 09/04/2011  Findings:  Gallbladder: No gallstones, gallbladder wall thickening, or  pericholecystic fluid.  Common bile duct: Normal caliber, 4 mm.  Liver: Slight increased echotexture throughout the liver which may  reflect fatty infiltration. No biliary dilatation or focal  abnormality.  IVC: Appears normal.  Pancreas: No focal abnormality seen.  Spleen: Within normal limits in size and echotexture.  Right Kidney: Normal in size and parenchymal echogenicity. No  evidence of mass or hydronephrosis. Simple appearing 1.6 cm cyst in  the mid pole.  Left Kidney: Normal in size and parenchymal echogenicity. No  evidence of mass or hydronephrosis.  Abdominal aorta: No aneurysm identified.  IMPRESSION:  Suspect mild fatty infiltration of the liver. No evidence for  gallbladder disease.  Original Report Authenticated By:  Cyndie Chime, M.D.     Assessment:  [name] is a 56 year old [place] woman with a history of a T2, N1, nuclear grade 2 invasive lobular carcinoma of the left breast for which she underwent a left lumpectomy with sentinel node dissection on 08/14/2011  revealing a 2.2 cm primary lesion with 2/3 sentinal nodes involved with metastatic disease with extracapsular extension. Tumor was noted to be ER/PR positive at 20/10% respectively, HER-2 negative, with a Ki-67 of 30%. She underwent reexcision of the left axillary lymph nodes on 08/28/2011 which showed 0 of 4 nodes involved. She is participating in NSABP B-49, arm one, having completed 4 cycles of adjuvant dose dense Adriamycin/Cytoxan, due for week 10/12 planned weekly Taxol. Dose of taxol  be given today. Her her dose will be kept at the lower dose. We will see her next week. I have done some feels well today. I have started on a Z-Pak empirically.  Of note is that her LFTs are also somewhat improved.  .  2.. History of herpes simplex on Valtrex 500mg  orally a day.  4. 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 Grade 1 peripheral neuropathy, on vitamin B-complex daily.  6. History of asymptomatic rise in LFTs ECOG: 0      Plan:  Mardi will receive week 10 of 12 planned adjuvant Taxol today as scheduled, though at a 25% dose reduction.   We will regroup in one week's time for followup prior to week 9. [place] knows to contact us in the interim if the need should arise.  Samba Cumba, md 01/08/12

## 2012-01-22 NOTE — Telephone Encounter (Signed)
gve the pt her appt with dr Basilio Cairo in rad onc for aug.

## 2012-01-24 ENCOUNTER — Encounter: Payer: Self-pay | Admitting: Radiation Oncology

## 2012-01-25 ENCOUNTER — Encounter: Payer: Self-pay | Admitting: Radiation Oncology

## 2012-01-25 ENCOUNTER — Ambulatory Visit
Admission: RE | Admit: 2012-01-25 | Discharge: 2012-01-25 | Disposition: A | Discharge status: Home / Self Care | Payer: 59 | Source: Ambulatory Visit | Attending: Radiation Oncology | Admitting: Radiation Oncology

## 2012-01-25 VITALS — BP 123/83 | HR 73 | Temp 97.6°F | Wt 194.9 lb

## 2012-01-25 DIAGNOSIS — C50419 Malignant neoplasm of upper-outer quadrant of unspecified female breast: Secondary | ICD-10-CM

## 2012-01-25 DIAGNOSIS — C773 Secondary and unspecified malignant neoplasm of axilla and upper limb lymph nodes: Secondary | ICD-10-CM | POA: Insufficient documentation

## 2012-01-25 DIAGNOSIS — Z87891 Personal history of nicotine dependence: Secondary | ICD-10-CM | POA: Insufficient documentation

## 2012-01-25 DIAGNOSIS — Z90710 Acquired absence of both cervix and uterus: Secondary | ICD-10-CM | POA: Insufficient documentation

## 2012-01-25 DIAGNOSIS — Z808 Family history of malignant neoplasm of other organs or systems: Secondary | ICD-10-CM | POA: Insufficient documentation

## 2012-01-25 DIAGNOSIS — Z809 Family history of malignant neoplasm, unspecified: Secondary | ICD-10-CM | POA: Insufficient documentation

## 2012-01-25 NOTE — Addendum Note (Signed)
Encounter addended by: Tessa Lerner, RN on: 01/25/2012  4:46 PM<BR>     Documentation filed: Charges VN

## 2012-01-25 NOTE — Progress Notes (Signed)
Radiation Oncology         [phone] ________________________________  Name: Emily Knight MRN: [id number]  Date: 01/25/2012  DOB: 20-Jul-1955  Follow-Up Visit Note  CC: Mickie Hillier, MD  Pierce Crane, MD  Diagnosis: pT2N1M0 base of lobular carcinoma, left breast, upper outer quadrant, grade 2, ER/PR positive, HER-2/neu negative   Narrative:  The patient returns today for routine follow-up.  She underwent lumpectomy and sentinel lymph node biopsy on 2/26 /13. A 2.2 cm tumor was identified with extensive lobular carcinoma in situ in addition to the grade 2 invasive lobular carcinoma. The margins were negative by at least 0.8 cm. Two out of three sentinel nodes were positive. Both of these contained extracapsular extension. The patient's disease is ER/PR positive and HER-2/neu negative. She underwent axillary dissection of 4 additional lymph nodes on 08/28/2011 and all 4 of these lymph nodes were negative. She then went on to receive chemotherapy in the form of 4 cycles dose dense Adriamycin and Cytoxan followed by weekly Taxol. The patient receives her final 12 week of Taxol on August 20.  The patient developed some peripheral neuropathy and changes in her fingernail beds during chemotherapy.  She is quite adamant about being able to attend a fundraiser activity on [facility] on October 12 which will last for 5 weeks.                            ALLERGIES:  is allergic to adhesive and codeine.  Meds: Current Outpatient Prescriptions  Medication Sig Dispense Refill  . B Complex-C (B-COMPLEX WITH VITAMIN C) tablet Take 1 tablet by mouth 2 (two) times daily. Initiated at one tablet daily; increased dose to BID 01/15/2012 per Dr. Donnie Coffin.      Marland Kitchen lansoprazole (PREVACID) 15 MG capsule Take 15 mg by mouth 2 (two) times daily.      Marland Kitchen lidocaine-prilocaine (EMLA) cream Apply 1 application topically as needed.      . loratadine (CLARITIN) 10 MG tablet Take 10 mg by mouth daily.      Marland Kitchen  LORazepam (ATIVAN) 0.5 MG tablet Take 0.5 mg by mouth every 8 (eight) hours.      . naproxen sodium (ANAPROX) 220 MG tablet Take 220 mg by mouth 2 (two) times daily with a meal.      . ondansetron (ZOFRAN) 8 MG tablet Take 8 mg by mouth every 8 (eight) hours as needed.      . prochlorperazine (COMPAZINE) 10 MG tablet Take 1 tablet (10 mg total) by mouth every 6 (six) hours as needed. 1 tab every 6 hours as needed for breakthrough nausea  30 tablet  2  . valACYclovir (VALTREX) 500 MG tablet Take 500 mg by mouth 2 (two) times daily. Per patient on 12/04/2011, stopped taking about one week ago.      Marland Kitchen VITAMIN E PO Take 1 capsule by mouth daily.      Marland Kitchen azithromycin (ZITHROMAX) 1 G powder Take 1 packet by mouth once.  1 each  0  . ciprofloxacin (CIPRO) 500 MG tablet Take 500 mg by mouth 2 (two) times daily.       No current facility-administered medications for this encounter.   Facility-Administered Medications Ordered in Other Encounters  Medication Dose Route Frequency Provider Last Rate Last Dose  . lidocaine-prilocaine (EMLA) cream   Topical Once Pierce Crane, MD        Physical Findings: The patient is in no acute distress.  Patient is alert and oriented.  weight is 194 lb 14.4 oz (88.406 kg). Her temperature is 97.6 F (36.4 C). Her blood pressure is 123/83 and her pulse is 73. Marland Kitchen  She has alopecia. Well-nourished. Her neck demonstrates no palpable cervical or supraclavicular lymphadenopathy. Heart regular in rate and rhythm. Chest clear to auscultation bilaterally. Breast exam demonstrates no palpable lesions of concern in either breast. Lumpectomy scar in the left breast has healed well. No palpable lymphadenopathy in the axillary regions bilaterally. No lymphedema in her arms.   Lab Findings: Lab Results  Component Value Date   WBC 4.2 01/22/2012   HGB 13.3 01/22/2012   HCT 37.7 01/22/2012   MCV 100.7 01/22/2012   PLT 369 01/22/2012    CMP     Component Value Date/Time   NA 139 01/22/2012  0927   K 4.1 01/22/2012 0927   CL 105 01/22/2012 0927   CO2 24 01/22/2012 0927   GLUCOSE 107* 01/22/2012 0927   BUN 14 01/22/2012 0927   CREATININE 0.71 01/22/2012 0927   CALCIUM 9.7 01/22/2012 0927   PROT 7.0 01/22/2012 0927   ALBUMIN 4.0 01/22/2012 0927   AST 36 01/22/2012 0927   ALT 59* 01/22/2012 0927   ALKPHOS 61 01/22/2012 0927   BILITOT 0.3 01/22/2012 0927   GFRNONAA >90 08/09/2011 1600   GFRAA >90 08/09/2011 1600     Radiographic Findings: No results found.    Impression and Plan:   This is a very pleasant 56 year old woman with T2 N1 M0 left invasive lobular breast cancer.  She completes chemotherapy on August 20. She has some urgency to complete radiotherapy by October 12 for a major fundraiser at [facility] for breast cancer patients.  The patient I once again discussed the indications for postlumpectomy radiation. She understands that radiotherapy would decrease her chance of an in breast recurrence or lymph node recurrence by about two thirds. I plan to treat her left breast, supra-clavicular fossa, and axilla to approximately 50 gray in 25 fractions followed by boost the lumpectomy cavity of 10 gray in 5 fractions. I am covering the axilla in light of 2 positive lymph nodes and the fact that no more than 7 axillary lymph nodes were resected surgically. We discussed the risks, benefits, and side effects of radiotherapy. We discussed that radiation would take approximately 6 weeks to complete. WE spoke about acute effects including skin irritation and fatigue as well as much less common late effects including lung and heart irritation. We discussed the risk of lymphedema to her left arm. We spoke about the latest technology that is used to minimize the risk of late effects for breast cancer patients undergoing radiotherapy. No guarantees of treatment were given. The patient is enthusiastic about proceeding with treatment. I look forward to participating in the patient's care.  Patient is scheduled for  a treatment planning scan next week; we will hold her treatment until August 27 so she has a week to recover from her last cycle of chemotherapy. This will allow her to finish her treatments by October 12.   _____________________________________   Lonie Peak, MD

## 2012-01-25 NOTE — Progress Notes (Signed)
Here follow up new consult of left breast lobular carcinoma.ERPR+ HER-2/neu negative.Patient will complete chemotherapy regime of 4 cycles adjuvant Adriamycin/Cytoxan.on 02/05/12 and is anxious to get started on radiation as soon as possible as has plans in October.Patient is currently participating in study NSABP B-49.

## 2012-01-25 NOTE — Progress Notes (Signed)
Please see the Nurse Progress Note in the MD Initial Consult Encounter for this patient. 

## 2012-01-28 ENCOUNTER — Ambulatory Visit
Admission: RE | Admit: 2012-01-28 | Discharge: 2012-01-28 | Disposition: A | Discharge status: Home / Self Care | Payer: 59 | Source: Ambulatory Visit | Attending: Radiation Oncology | Admitting: Radiation Oncology

## 2012-01-28 DIAGNOSIS — C50419 Malignant neoplasm of upper-outer quadrant of unspecified female breast: Secondary | ICD-10-CM

## 2012-01-28 NOTE — Progress Notes (Signed)
Simulation / Treatment Planning Note  The patient has a diagnosis of   Left breast cancer; she is status post lumpectomy and axillary dissection. She will receive whole breast radiotherapy and regional lymph node radiotherapy. The patient was laid in the supine position on the treatment table with her arms over her head. Her head was in an Accuform device. I placed adhesive wiring over her lumpectomy scar and around the borders of her breast tissue . High-resolution CT axial imaging was obtained of the patient's chest. An isocenter was placed in her upper left chest wall. Skin markings were made and she tolerated the procedure well without any complications.  The patient was then scanned in the CT simulator while holding her breath. I verified with our physicist that this increased the distance between her heart and chest wall. We will treat her with the breath-hold technique and base her planning on the breath-hold images.   Treatment planning note: the left breast will be treated with opposed tangential fields using MLCs for custom blocks. I plan to prescribe 50 Gray in 25 fractions to the whole breast. I will also treat the left supraclavicular region to 50 gray in 25 fractions using a right anterior oblique field. A posterior field will supplement axillary nodal tissue to reach 50 gray in 25 fractions. All 4 fields will use MLCs for custom blocks. All 4 fields will be delivered with the breath-hold technique.  3-D conformal radiotherapy will be used to target the patient's at risk tissues while sparing her normal tissue from excessive dose. I requested a DVH of the patient's lungs, heart, and lumpectomy cavity  Eventually, the patient's lumpectomy cavity will be boosted to an additional 10 gray in 5 fractions.

## 2012-01-29 ENCOUNTER — Other Ambulatory Visit (HOSPITAL_BASED_OUTPATIENT_CLINIC_OR_DEPARTMENT_OTHER): Payer: 59 | Admitting: Lab

## 2012-01-29 ENCOUNTER — Other Ambulatory Visit: Payer: Self-pay | Admitting: *Deleted

## 2012-01-29 ENCOUNTER — Ambulatory Visit (HOSPITAL_BASED_OUTPATIENT_CLINIC_OR_DEPARTMENT_OTHER): Payer: 59

## 2012-01-29 ENCOUNTER — Telehealth: Payer: Self-pay | Admitting: *Deleted

## 2012-01-29 ENCOUNTER — Ambulatory Visit (HOSPITAL_BASED_OUTPATIENT_CLINIC_OR_DEPARTMENT_OTHER): Payer: 59 | Admitting: Oncology

## 2012-01-29 ENCOUNTER — Encounter: Payer: 59 | Admitting: *Deleted

## 2012-01-29 VITALS — BP 126/87 | HR 83 | Temp 97.8°F | Resp 20 | Ht 67.0 in | Wt 194.5 lb

## 2012-01-29 DIAGNOSIS — C50919 Malignant neoplasm of unspecified site of unspecified female breast: Secondary | ICD-10-CM

## 2012-01-29 DIAGNOSIS — Z5111 Encounter for antineoplastic chemotherapy: Secondary | ICD-10-CM

## 2012-01-29 DIAGNOSIS — G609 Hereditary and idiopathic neuropathy, unspecified: Secondary | ICD-10-CM

## 2012-01-29 DIAGNOSIS — C50419 Malignant neoplasm of upper-outer quadrant of unspecified female breast: Secondary | ICD-10-CM

## 2012-01-29 DIAGNOSIS — B009 Herpesviral infection, unspecified: Secondary | ICD-10-CM

## 2012-01-29 LAB — COMPREHENSIVE METABOLIC PANEL
Albumin: 4.3 g/dL (ref 3.5–5.2)
Alkaline Phosphatase: 58 U/L (ref 39–117)
BUN: 13 mg/dL (ref 6–23)
Calcium: 9.9 mg/dL (ref 8.4–10.5)
Chloride: 101 mEq/L (ref 96–112)
Glucose, Bld: 120 mg/dL — ABNORMAL HIGH (ref 70–99)
Potassium: 4.3 mEq/L (ref 3.5–5.3)

## 2012-01-29 LAB — CBC WITH DIFFERENTIAL/PLATELET
Basophils Absolute: 0 10*3/uL (ref 0.0–0.1)
Eosinophils Absolute: 0.1 10*3/uL (ref 0.0–0.5)
HGB: 13.6 g/dL (ref 11.6–15.9)
MONO#: 0.4 10*3/uL (ref 0.1–0.9)
NEUT#: 3.5 10*3/uL (ref 1.5–6.5)
RDW: 14 % (ref 11.2–14.5)
lymph#: 1 10*3/uL (ref 0.9–3.3)

## 2012-01-29 MED ORDER — SODIUM CHLORIDE 0.9 % IJ SOLN
10.0000 mL | INTRAMUSCULAR | Status: DC | PRN
  Administered 2012-01-29: 10 mL
  Filled 2012-01-29: qty 10

## 2012-01-29 MED ORDER — HEPARIN SOD (PORK) LOCK FLUSH 100 UNIT/ML IV SOLN
500.0000 [IU] | Freq: Once | INTRAVENOUS | Status: DC | PRN
  Filled 2012-01-29: qty 5

## 2012-01-29 MED ORDER — DEXAMETHASONE SODIUM PHOSPHATE 10 MG/ML IJ SOLN
10.0000 mg | Freq: Once | INTRAMUSCULAR | Status: AC
  Administered 2012-01-29: 10 mg via INTRAVENOUS

## 2012-01-29 MED ORDER — DIPHENHYDRAMINE HCL 50 MG/ML IJ SOLN
50.0000 mg | Freq: Once | INTRAMUSCULAR | Status: AC
  Administered 2012-01-29: 50 mg via INTRAVENOUS

## 2012-01-29 MED ORDER — HEPARIN SOD (PORK) LOCK FLUSH 100 UNIT/ML IV SOLN
250.0000 [IU] | Freq: Once | INTRAVENOUS | Status: AC | PRN
  Administered 2012-01-29: 16:00:00
  Filled 2012-01-29: qty 5

## 2012-01-29 MED ORDER — SODIUM CHLORIDE 0.9 % IV SOLN
Freq: Once | INTRAVENOUS | Status: AC
  Administered 2012-01-29: 14:00:00 via INTRAVENOUS

## 2012-01-29 MED ORDER — PACLITAXEL CHEMO INJECTION 300 MG/50ML
60.0000 mg/m2 | Freq: Once | INTRAVENOUS | Status: AC
  Administered 2012-01-29: 120 mg via INTRAVENOUS
  Filled 2012-01-29: qty 20

## 2012-01-29 MED ORDER — ONDANSETRON 8 MG/50ML IVPB (CHCC)
8.0000 mg | Freq: Once | INTRAVENOUS | Status: AC
  Administered 2012-01-29: 8 mg via INTRAVENOUS

## 2012-01-29 MED ORDER — FAMOTIDINE IN NACL 20-0.9 MG/50ML-% IV SOLN
20.0000 mg | Freq: Once | INTRAVENOUS | Status: AC
  Administered 2012-01-29: 20 mg via INTRAVENOUS

## 2012-01-29 NOTE — Progress Notes (Signed)
Hematology and Oncology Follow Up Visit  Emily Knight [id number] [birth year] 56 y.o. 01/08/12  HPI: Emily Knight is a 56 year old [place] woman with a history of a T2, N1, nuclear grade 2 invasive lobular carcinoma of the left breast for which she underwent a left lumpectomy with sentinel node dissection on 08/14/2011  revealing a 2.2 cm primary lesion with 2/3 sentinal nodes involved with metastatic disease with extracapsular extension. Tumor was noted to be ER/PR positive at 20/10% respectively, HER-2 negative, with a Ki-67 of 30%. She underwent reexcision of the left axillary lymph nodes on 08/28/2011 which showed 0 of 4 nodes involved. She is participating in NSABP B-49, arm one, having completed 4 cycles of adjuvant dose dense Adriamycin/Cytoxan, due for week 10/12 planned weekly Taxol.   Interim History:   Patient returns for followup. I haven't seen last week she was given a dose of azithromycin. She continues to have been congested nasal discharge which is yellow-green but no fever no tenderness in her face. She does take a  B complex vitamin and numbness in her face is improved. She has minimal if any numbness tingling in the tips of her toes and fingers. She feels otherwise good. She has no other complaints. She is anxious to complete chemotherapy and get started on radiation She does describe a subjective sensation of numbness and swelling of her feet she has not tapered as any other problems. She is also not having problems with her hands. Review of Systems: Constitutional:  no weight loss, fever, night sweats and feels well Eyes: uses glasses ENT: no complaints Cardiovascular: no chest pain or dyspnea on exertion Respiratory: no cough, shortness of breath, or wheezing Neurological: no TIA or stroke symptoms Dermatological: negative Gastrointestinal: no abdominal pain, change in bowel habits, or black or bloody stools Genito-Urinary: no dysuria, trouble voiding, or  hematuria Hematological and Lymphatic: negative Breast: negative for breast lumps Musculoskeletal: negative Remaining ROS negative.   Medications:   I have reviewed the patient's current medications.  Current Outpatient Prescriptions  Medication Sig Dispense Refill  . B Complex-C (B-COMPLEX WITH VITAMIN C) tablet Take 1 tablet by mouth 2 (two) times daily. Initiated at one tablet daily; increased dose to BID 01/15/2012 per Dr. Donnie Coffin.      . ciprofloxacin (CIPRO) 500 MG tablet Take 500 mg by mouth 2 (two) times daily.      . lansoprazole (PREVACID) 15 MG capsule Take 15 mg by mouth 2 (two) times daily.      Marland Kitchen lidocaine-prilocaine (EMLA) cream Apply 1 application topically as needed.      . loratadine (CLARITIN) 10 MG tablet Take 10 mg by mouth daily.      Marland Kitchen LORazepam (ATIVAN) 0.5 MG tablet Take 0.5 mg by mouth every 8 (eight) hours.      . naproxen sodium (ANAPROX) 220 MG tablet Take 220 mg by mouth 2 (two) times daily with a meal.      . ondansetron (ZOFRAN) 8 MG tablet Take 8 mg by mouth every 8 (eight) hours as needed.      . prochlorperazine (COMPAZINE) 10 MG tablet Take 1 tablet (10 mg total) by mouth every 6 (six) hours as needed. 1 tab every 6 hours as needed for breakthrough nausea  30 tablet  2  . valACYclovir (VALTREX) 500 MG tablet Take 500 mg by mouth 2 (two) times daily. Per patient on 12/04/2011, stopped taking about one week ago.      Marland Kitchen VITAMIN E PO Take 1  capsule by mouth daily.       No current facility-administered medications for this visit.   Facility-Administered Medications Ordered in Other Visits  Medication Dose Route Frequency Provider Last Rate Last Dose  . lidocaine-prilocaine (EMLA) cream   Topical Once Pierce Crane, MD        Allergies:  Allergies  Allergen Reactions  . Adhesive (Tape) Other (See Comments)    BLISTERS  . Codeine Nausea And Vomiting    Physical Exam: Filed Vitals:   01/29/12 1159  BP: 126/87  Pulse: 83  Temp: 97.8 F (36.6 C)    Resp: 20    Body mass index is 30.46 kg/(m^2). Weight: 193 lbs. HEENT:  Sclerae anicteric, conjunctivae pink. Oropharynx does reveal evidence of an also or on the right lateral tongue region. Minimal erythema surrounding. No oral mucositis otherwise or candidiasis. Posterior pharynx may be a little bit red. Nodes:  No cervical, supraclavicular, or axillary lymphadenopathy palpated.  Breast Exam:  Deferred.   Lungs:  Clear to auscultation bilaterally.  No crackles, rhonchi, or wheezes.   Heart:  Regular rate and rhythm.   Abdomen:  Soft, nontender.  Positive bowel sounds.  No organomegaly or masses palpated.   Musculoskeletal:  No focal spinal tenderness to palpation.  Extremities:  Benign.  No peripheral edema or cyanosis.   Skin:  Benign.   Neuro:  Nonfocal, alert and oriented x 3, of note strength is intact.   Lab Results: Lab Results  Component Value Date   WBC 5.0 01/29/2012   HGB 13.6 01/29/2012   HCT 38.4 01/29/2012   MCV 99.4 01/29/2012   PLT 360 01/29/2012   NEUTROABS 3.5 01/29/2012     Chemistry      Component Value Date/Time   NA 139 01/22/2012 0927   K 4.1 01/22/2012 0927   CL 105 01/22/2012 0927   CO2 24 01/22/2012 0927   BUN 14 01/22/2012 0927   CREATININE 0.71 01/22/2012 0927      Component Value Date/Time   CALCIUM 9.7 01/22/2012 0927   ALKPHOS 61 01/22/2012 0927   AST 36 01/22/2012 0927   ALT 59* 01/22/2012 0927   BILITOT 0.3 01/22/2012 0927      Lab Results  Component Value Date   LABCA2 27 09/05/2011    Radiological Studies: 11/02/11 COMPLETE ABDOMINAL ULTRASOUND  Comparison: CT 09/04/2011  Findings:  Gallbladder: No gallstones, gallbladder wall thickening, or  pericholecystic fluid.  Common bile duct: Normal caliber, 4 mm.  Liver: Slight increased echotexture throughout the liver which may  reflect fatty infiltration. No biliary dilatation or focal  abnormality.  IVC: Appears normal.  Pancreas: No focal abnormality seen.  Spleen: Within normal limits in size and  echotexture.  Right Kidney: Normal in size and parenchymal echogenicity. No  evidence of mass or hydronephrosis. Simple appearing 1.6 cm cyst in  the mid pole.  Left Kidney: Normal in size and parenchymal echogenicity. No  evidence of mass or hydronephrosis.  Abdominal aorta: No aneurysm identified.  IMPRESSION:  Suspect mild fatty infiltration of the liver. No evidence for  gallbladder disease.  Original Report Authenticated By: Cyndie Chime, M.D.     Assessment:  Emily Knight is a 56 year old [place] woman with a history of a T2, N1, nuclear grade 2 invasive lobular carcinoma of the left breast for which she underwent a left lumpectomy with sentinel node dissection on 08/14/2011  revealing a 2.2 cm primary lesion with 2/3 sentinal nodes involved with metastatic disease with extracapsular extension.  Tumor was noted to be ER/PR positive at 20/10% respectively, HER-2 negative, with a Ki-67 of 30%. She underwent reexcision of the left axillary lymph nodes on 08/28/2011 which showed 0 of 4 nodes involved. She is participating in NSABP B-49, arm one, having completed 4 cycles of adjuvant dose dense Adriamycin/Cytoxan, due for week 10/12 planned weekly Taxol. Dose of taxol  be given today. Her her dose will be kept at the lower dose. We will see her next week. I have done some feels well today. I have started on a Z-Pak empirically. Of note is that her LFTs are also somewhat improved.  .  2.. History of herpes simplex on Valtrex 500mg  orally a day.  4. 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 Grade 1 peripheral neuropathy, on vitamin B-complex daily.  6. History of asymptomatic rise in LFTs ECOG: 0      Plan:  Emily Knight will receive week 11 of 12 planned adjuvant Taxol today as scheduled, though at a 25% dose reduction. She has been seen by radiation oncology and apparently will be starting radiation sometime during the month.   Zackary Mckeone, md 01/08/12

## 2012-01-29 NOTE — Progress Notes (Signed)
Patient in to clinic today for evaluation prior to receiving weekly paclitaxel dose #11. Patient reports perioral numbness that occurred on days 3 & 4 post-treatment, but these symptoms resolved. She reports taking vitamin B complex twice daily as instructed by MD. She notes that neuropathy in her feet is unchanged (grade 1) and does not interfere with function. She continues to have some nailbed sensitivity.  Based on lab results review and history and physical by Dr. Donnie Coffin, patient condition is acceptable for continued treatment. Patient notes that she is scheduled to begin RT in two weeks, following a one week break post-chemo for recovery, as recommended by Dr. Basilio Cairo.   Patient states she expects to continue on her planned beach trip following completion of RT on 03/28/2012. She expects to return from her trip around November 5th or 6th. Patient is aware of a planned medical oncology follow-up visit with labwork during RT, approximately 3-4 weeks after the last chemotherapy treatment.  Sign for infusion given to Kirt Boys RN.

## 2012-01-29 NOTE — Patient Instructions (Addendum)
[facility] Discharge Instructions for Patients Receiving Chemotherapy  Today you received the following chemotherapy agents taxol  To help prevent nausea and vomiting after your treatment, we encourage you to take your nausea medication  and take it as often as prescribed   If you develop nausea and vomiting that is not controlled by your nausea medication, call the clinic. If it is after clinic hours your family physician or the after hours number for the clinic or go to the Emergency Department.   BELOW ARE SYMPTOMS THAT SHOULD BE REPORTED IMMEDIATELY:  *FEVER GREATER THAN 100.5 F  *CHILLS WITH OR WITHOUT FEVER  NAUSEA AND VOMITING THAT IS NOT CONTROLLED WITH YOUR NAUSEA MEDICATION  *UNUSUAL SHORTNESS OF BREATH  *UNUSUAL BRUISING OR BLEEDING  TENDERNESS IN MOUTH AND THROAT WITH OR WITHOUT PRESENCE OF ULCERS  *URINARY PROBLEMS  *BOWEL PROBLEMS  UNUSUAL RASH Items with * indicate a potential emergency and should be followed up as soon as possible.  One of the nurses will contact you 24 hours after your treatment. Please let the nurse know about any problems that you may have experienced. Feel free to call the clinic you have any questions or concerns. The clinic phone number is [phone].   I have been informed and understand all the instructions given to me. I know to contact the clinic, my physician, or go to the Emergency Department if any problems should occur. I do not have any questions at this time, but understand that I may call the clinic during office hours   should I have any questions or need assistance in obtaining follow up care.    __________________________________________  _____________  __________ Signature of Patient or Authorized Representative            Date                   Time    __________________________________________ Nurse's Signature    

## 2012-01-29 NOTE — Telephone Encounter (Signed)
Made patient appointment per orders from 01-29-2012 with the lab one before the md appointment

## 2012-01-31 ENCOUNTER — Telehealth: Payer: Self-pay | Admitting: *Deleted

## 2012-01-31 NOTE — Telephone Encounter (Signed)
Spoke with Tia she will get in contact with Judithann Graves (research nurse) to advise scheduling where to place this patient

## 2012-02-01 ENCOUNTER — Telehealth: Payer: Self-pay | Admitting: Oncology

## 2012-02-01 ENCOUNTER — Telehealth: Payer: Self-pay | Admitting: *Deleted

## 2012-02-01 NOTE — Telephone Encounter (Signed)
Patient confirmed over the phone the new date and time of the appointments for starting at 8:45

## 2012-02-01 NOTE — Telephone Encounter (Signed)
Left vm appt for lb/md has been moved to 8/19 @  2.45pm due to md out of office. Advised in vm infusion appt has not changed and is scheduled on 8/20 @ 11.45am.

## 2012-02-04 ENCOUNTER — Other Ambulatory Visit (HOSPITAL_BASED_OUTPATIENT_CLINIC_OR_DEPARTMENT_OTHER): Payer: 59 | Admitting: Lab

## 2012-02-04 ENCOUNTER — Ambulatory Visit (HOSPITAL_BASED_OUTPATIENT_CLINIC_OR_DEPARTMENT_OTHER): Payer: 59 | Admitting: Family

## 2012-02-04 ENCOUNTER — Encounter: Payer: 59 | Admitting: *Deleted

## 2012-02-04 ENCOUNTER — Encounter: Payer: Self-pay | Admitting: Family

## 2012-02-04 VITALS — BP 136/88 | HR 74 | Temp 98.3°F | Resp 18 | Ht 67.0 in | Wt 193.5 lb

## 2012-02-04 DIAGNOSIS — G5690 Unspecified mononeuropathy of unspecified upper limb: Secondary | ICD-10-CM

## 2012-02-04 DIAGNOSIS — C50919 Malignant neoplasm of unspecified site of unspecified female breast: Secondary | ICD-10-CM

## 2012-02-04 DIAGNOSIS — B009 Herpesviral infection, unspecified: Secondary | ICD-10-CM

## 2012-02-04 DIAGNOSIS — C50419 Malignant neoplasm of upper-outer quadrant of unspecified female breast: Secondary | ICD-10-CM

## 2012-02-04 DIAGNOSIS — G5790 Unspecified mononeuropathy of unspecified lower limb: Secondary | ICD-10-CM

## 2012-02-04 LAB — COMPREHENSIVE METABOLIC PANEL
Alkaline Phosphatase: 50 U/L (ref 39–117)
CO2: 25 mEq/L (ref 19–32)
Creatinine, Ser: 0.77 mg/dL (ref 0.50–1.10)
Glucose, Bld: 114 mg/dL — ABNORMAL HIGH (ref 70–99)
Sodium: 137 mEq/L (ref 135–145)
Total Bilirubin: 0.3 mg/dL (ref 0.3–1.2)
Total Protein: 6.5 g/dL (ref 6.0–8.3)

## 2012-02-04 LAB — CBC WITH DIFFERENTIAL/PLATELET
Basophils Absolute: 0 10*3/uL (ref 0.0–0.1)
Eosinophils Absolute: 0.1 10*3/uL (ref 0.0–0.5)
HCT: 37.4 % (ref 34.8–46.6)
HGB: 13.2 g/dL (ref 11.6–15.9)
LYMPH%: 19.5 % (ref 14.0–49.7)
MCV: 99.6 fL (ref 79.5–101.0)
MONO#: 0.2 10*3/uL (ref 0.1–0.9)
MONO%: 4.8 % (ref 0.0–14.0)
NEUT#: 3 10*3/uL (ref 1.5–6.5)
NEUT%: 72.9 % (ref 38.4–76.8)
Platelets: 308 10*3/uL (ref 145–400)
WBC: 4.1 10*3/uL (ref 3.9–10.3)

## 2012-02-04 NOTE — Patient Instructions (Signed)
Begin radiation with Dr. Karoline Caldwell.  Return 9/16 with Dr. Donnie Coffin.  Add Vit D to today's lab.

## 2012-02-04 NOTE — Progress Notes (Signed)
02/04/2012 Patient in to clinic today for evaluation prior to weekly paclitaxel dose #12. She reports ongoing nail changes, not interfering with function, and notes eyebrow hair loss this weekend, while scalp hair is continuing to grow. She reports no changes in her neuropathy symptoms, with mild changes of numbness in fingertips and soles of feet, not interfering with function.  Based on lab results review and history and physical by Colman Cater NP, patient condition is acceptable for continued treatment tomorrow.

## 2012-02-04 NOTE — Progress Notes (Signed)
Hematology and Oncology Follow Up Visit  Emily Knight [id number] 1955-08-03 56 y.o. 01/08/12  HPI: Emily Knight is a 56 year old [place] woman with a history of a T2, N1, nuclear grade 2 invasive lobular carcinoma of the left breast for which she underwent a left lumpectomy with sentinel node dissection on 08/14/2011  revealing a 2.2 cm primary lesion with 2/3 sentinal nodes involved with metastatic disease with extracapsular extension. Tumor was noted to be ER/PR positive at 20/10% respectively, HER-2 negative, with a Ki-67 of 30%. She underwent reexcision of the left axillary lymph nodes on 08/28/2011 which showed 0 of 4 nodes involved. She is participating in NSABP B-49, arm one, having completed 4 cycles of adjuvant dose dense Adriamycin/Cytoxan, due for week 12/12 planned weekly Taxol.   Interim History:   She is anxious to complete chemotherapy and get started on radiation. Has upcoming appt with Emily Knight. Continues with grade 1 neuropathy in hands and feet. Has some circumoral numbness for several days following chemo, resolves after 3-4 days.   Medications:   I have reviewed the patient's current medications.  Allergies:  Allergies  Allergen Reactions  . Adhesive (Tape) Other (See Comments)    BLISTERS  . Codeine Nausea And Vomiting    Physical Exam: Filed Vitals:   02/04/12 0906  BP: 136/88  Pulse: 74  Temp: 98.3 F (36.8 C)  Resp: 18    Body mass index is 30.31 kg/(m^2). ECOG 0 Weight: 193 lbs. HEENT:  Sclerae anicteric, conjunctivae pink.  Nodes:  No cervical, supraclavicular, or axillary lymphadenopathy palpated.  Lungs:  Clear to auscultation bilaterally.  No crackles, rhonchi, or wheezes.   Heart:  Regular rate and rhythm.   Abdomen:  Soft, nontender.  Positive bowel sounds.  No organomegaly or masses palpated.   Musculoskeletal:  No focal spinal tenderness to palpation.  Extremities:  Benign.  No peripheral edema or cyanosis.   Skin:   Benign.   Neuro:  Nonfocal, alert and oriented x 3, of note strength is intact. BREAST EXAM: In the supine position, with the right arm over the head, the right nipple is everted. No periareolar edema or nipple discharge. No mass in any quadrant or subareolar region. No redness of the skin. No right axillary adenopathy. With the left arm over the head, the left nipple is everted. No periareolar edema or nipple discharge. No mass in any quadrant or subareolar region. At the 4 o'clock position, a 5 cm well healed incision with volume deficit under the scar. No redness of the skin. No left axillary adenopathy.    Lab Results: Lab Results  Component Value Date   WBC 4.1 02/04/2012   HGB 13.2 02/04/2012   HCT 37.4 02/04/2012   MCV 99.6 02/04/2012   PLT 308 02/04/2012   NEUTROABS 3.0 02/04/2012     Chemistry      Component Value Date/Time   NA 137 02/04/2012 0850   K 3.9 02/04/2012 0850   CL 103 02/04/2012 0850   CO2 25 02/04/2012 0850   BUN 12 02/04/2012 0850   CREATININE 0.77 02/04/2012 0850      Component Value Date/Time   CALCIUM 9.6 02/04/2012 0850   ALKPHOS 50 02/04/2012 0850   AST 32 02/04/2012 0850   ALT 51* 02/04/2012 0850   BILITOT 0.3 02/04/2012 0850      Assessment:  1. [name] is a 56 year old [place] woman with a history of a T2, N1 left breast cancer, on chemo with good  tolerance. No evidence of recurrence clinically.  2.. History of herpes simplex on Valtrex 500mg  orally a day. No evidence of recurrence.  3.  Due to begin radiation therapy in the next few weeks.  4. 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 Grade 1 peripheral neuropathy, on vitamin B-complex daily. 6. History of asymptomatic rise in LFTs, stable.   Plan:  Begin radiation with Emily Knight.  Return 9/16 with Emily Knight.  Add Vit D to today's lab.    Emily Cater, FNP-C 01/08/12

## 2012-02-05 ENCOUNTER — Ambulatory Visit (HOSPITAL_BASED_OUTPATIENT_CLINIC_OR_DEPARTMENT_OTHER): Payer: 59

## 2012-02-05 ENCOUNTER — Other Ambulatory Visit: Payer: 59 | Admitting: Lab

## 2012-02-05 ENCOUNTER — Ambulatory Visit: Payer: 59 | Admitting: Oncology

## 2012-02-05 VITALS — BP 148/96 | HR 81 | Temp 97.5°F | Resp 16

## 2012-02-05 DIAGNOSIS — Z5111 Encounter for antineoplastic chemotherapy: Secondary | ICD-10-CM

## 2012-02-05 DIAGNOSIS — C50419 Malignant neoplasm of upper-outer quadrant of unspecified female breast: Secondary | ICD-10-CM

## 2012-02-05 DIAGNOSIS — C50919 Malignant neoplasm of unspecified site of unspecified female breast: Secondary | ICD-10-CM

## 2012-02-05 MED ORDER — PACLITAXEL CHEMO INJECTION 300 MG/50ML
60.0000 mg/m2 | Freq: Once | INTRAVENOUS | Status: AC
  Administered 2012-02-05: 120 mg via INTRAVENOUS
  Filled 2012-02-05: qty 20

## 2012-02-05 MED ORDER — SODIUM CHLORIDE 0.9 % IV SOLN
Freq: Once | INTRAVENOUS | Status: AC
  Administered 2012-02-05: 12:00:00 via INTRAVENOUS

## 2012-02-05 MED ORDER — DIPHENHYDRAMINE HCL 50 MG/ML IJ SOLN
50.0000 mg | Freq: Once | INTRAMUSCULAR | Status: AC
  Administered 2012-02-05: 50 mg via INTRAVENOUS

## 2012-02-05 MED ORDER — DEXAMETHASONE SODIUM PHOSPHATE 10 MG/ML IJ SOLN
10.0000 mg | Freq: Once | INTRAMUSCULAR | Status: AC
  Administered 2012-02-05: 10 mg via INTRAVENOUS

## 2012-02-05 MED ORDER — ONDANSETRON 8 MG/50ML IVPB (CHCC)
8.0000 mg | Freq: Once | INTRAVENOUS | Status: AC
  Administered 2012-02-05: 8 mg via INTRAVENOUS

## 2012-02-05 MED ORDER — SODIUM CHLORIDE 0.9 % IJ SOLN
10.0000 mL | INTRAMUSCULAR | Status: DC | PRN
  Administered 2012-02-05: 10 mL
  Filled 2012-02-05: qty 10

## 2012-02-05 MED ORDER — FAMOTIDINE IN NACL 20-0.9 MG/50ML-% IV SOLN
20.0000 mg | Freq: Once | INTRAVENOUS | Status: AC
  Administered 2012-02-05: 20 mg via INTRAVENOUS

## 2012-02-05 MED ORDER — HEPARIN SOD (PORK) LOCK FLUSH 100 UNIT/ML IV SOLN
500.0000 [IU] | Freq: Once | INTRAVENOUS | Status: AC | PRN
  Administered 2012-02-05: 500 [IU]
  Filled 2012-02-05: qty 5

## 2012-02-05 NOTE — Patient Instructions (Signed)
[facility] Discharge Instructions for Patients Receiving Chemotherapy  Today you received the following chemotherapy agents Taxol  To help prevent nausea and vomiting after your treatment, we encourage you to take your nausea medication as prescribed.  If you develop nausea and vomiting that is not controlled by your nausea medication, call the clinic. If it is after clinic hours your family physician or the after hours number for the clinic or go to the Emergency Department.   BELOW ARE SYMPTOMS THAT SHOULD BE REPORTED IMMEDIATELY:  *FEVER GREATER THAN 100.5 F  *CHILLS WITH OR WITHOUT FEVER  NAUSEA AND VOMITING THAT IS NOT CONTROLLED WITH YOUR NAUSEA MEDICATION  *UNUSUAL SHORTNESS OF BREATH  *UNUSUAL BRUISING OR BLEEDING  TENDERNESS IN MOUTH AND THROAT WITH OR WITHOUT PRESENCE OF ULCERS  *URINARY PROBLEMS  *BOWEL PROBLEMS  UNUSUAL RASH Items with * indicate a potential emergency and should be followed up as soon as possible.  One of the nurses will contact you 24 hours after your treatment. Please let the nurse know about any problems that you may have experienced. Feel free to call the clinic you have any questions or concerns. The clinic phone number is [phone].   I have been informed and understand all the instructions given to me. I know to contact the clinic, my physician, or go to the Emergency Department if any problems should occur. I do not have any questions at this time, but understand that I may call the clinic during office hours   should I have any questions or need assistance in obtaining follow up care.    __________________________________________  _____________  __________ Signature of Patient or Authorized Representative            Date                   Time    __________________________________________ Nurse's Signature    

## 2012-02-05 NOTE — Progress Notes (Signed)
Patient in to clinic today for treatment. Sign for infusion and update on patient condition/final treatment given to infusion nurse Nelly Laurence.

## 2012-02-08 ENCOUNTER — Telehealth: Payer: Self-pay | Admitting: *Deleted

## 2012-02-08 NOTE — Telephone Encounter (Signed)
Received phone call from patient today stating that she feels a lot worse following this treatment and was concerned that she may have been given a higher dose of paclitaxel. Reviewed records and confirmed that correct dose was administered this week. Patient reports general aches and increased nailbed sensitivity. She has taken vitamin B complex and took Compazine yesterday. Patient will try pain reliever and encouraged to notify on-call physician if she does not improve over the weekend.

## 2012-02-11 ENCOUNTER — Ambulatory Visit
Admission: RE | Admit: 2012-02-11 | Discharge: 2012-02-11 | Disposition: A | Discharge status: Home / Self Care | Payer: 59 | Source: Ambulatory Visit | Attending: Radiation Oncology | Admitting: Radiation Oncology

## 2012-02-11 DIAGNOSIS — C50919 Malignant neoplasm of unspecified site of unspecified female breast: Secondary | ICD-10-CM

## 2012-02-11 NOTE — Progress Notes (Signed)
VERIFICATION SIMULATION NOTE  The patient was laid in the correct position on the treatment table for simulation verification. Portal imaging was obtained and I verified the fields and MLCs for her breast /lymph node treatments to be accurate. The patient tolerated the procedure well.  -----------------------------------------------------  Lonie Peak, MD

## 2012-02-12 ENCOUNTER — Ambulatory Visit
Admission: RE | Admit: 2012-02-12 | Discharge: 2012-02-12 | Disposition: A | Discharge status: Home / Self Care | Payer: 59 | Source: Ambulatory Visit | Attending: Radiation Oncology | Admitting: Radiation Oncology

## 2012-02-13 ENCOUNTER — Ambulatory Visit
Admission: RE | Admit: 2012-02-13 | Discharge: 2012-02-13 | Disposition: A | Discharge status: Home / Self Care | Payer: 59 | Source: Ambulatory Visit | Attending: Radiation Oncology | Admitting: Radiation Oncology

## 2012-02-13 ENCOUNTER — Other Ambulatory Visit: Payer: Self-pay | Admitting: *Deleted

## 2012-02-13 VITALS — Wt 196.3 lb

## 2012-02-13 DIAGNOSIS — C50919 Malignant neoplasm of unspecified site of unspecified female breast: Secondary | ICD-10-CM

## 2012-02-13 MED ORDER — LORAZEPAM 0.5 MG PO TABS: 0.5000 mg | ORAL_TABLET | Freq: Three times a day (TID) | ORAL | Status: DC

## 2012-02-13 NOTE — Progress Notes (Signed)
2nd fraction to left breast. Denies pain but reports soreness in the left axilla at incisional area.  Breast without any warmth or redness.

## 2012-02-13 NOTE — Telephone Encounter (Signed)
Pt "stopped by" after radiation therapy requesting this nurse look at her fingernail for possible infection. Fingernail looked discolored but no redness or swelling has been noted. Pt has been recommended to soak and then dry affected fingers in a hydrogen peroxide solution. Pt will stop by after radiation therapy if sx worsen

## 2012-02-13 NOTE — Progress Notes (Signed)
   Weekly Management Note Current Dose: 4 Gy  Projected Dose:  60Gy   Narrative:  The patient presents for routine under treatment assessment.  CBCT/MVCT images/Port film x-rays were reviewed.  The chart was checked. Doing well, no complaints.  Physical Findings:  weight is 196 lb 4.8 oz (89.041 kg).  No skin changes thus far.  Impression:  The patient is tolerating radiotherapy.  Plan:  Continue radiotherapy as planned.  ________________________________   Lonie Peak, M.D.

## 2012-02-14 ENCOUNTER — Ambulatory Visit
Admission: RE | Admit: 2012-02-14 | Discharge: 2012-02-14 | Disposition: A | Discharge status: Home / Self Care | Payer: 59 | Source: Ambulatory Visit | Attending: Radiation Oncology | Admitting: Radiation Oncology

## 2012-02-15 ENCOUNTER — Ambulatory Visit
Admission: RE | Admit: 2012-02-15 | Discharge: 2012-02-15 | Disposition: A | Discharge status: Home / Self Care | Payer: 59 | Source: Ambulatory Visit | Attending: Radiation Oncology | Admitting: Radiation Oncology

## 2012-02-19 ENCOUNTER — Ambulatory Visit
Admission: RE | Admit: 2012-02-19 | Discharge: 2012-02-19 | Disposition: A | Discharge status: Home / Self Care | Payer: 59 | Source: Ambulatory Visit | Attending: Radiation Oncology | Admitting: Radiation Oncology

## 2012-02-19 DIAGNOSIS — C50919 Malignant neoplasm of unspecified site of unspecified female breast: Secondary | ICD-10-CM | POA: Insufficient documentation

## 2012-02-20 ENCOUNTER — Encounter: Payer: Self-pay | Admitting: Radiation Oncology

## 2012-02-20 ENCOUNTER — Ambulatory Visit
Admission: RE | Admit: 2012-02-20 | Discharge: 2012-02-20 | Disposition: A | Discharge status: Home / Self Care | Payer: 59 | Source: Ambulatory Visit | Attending: Radiation Oncology | Admitting: Radiation Oncology

## 2012-02-20 VITALS — BP 139/85 | HR 84 | Temp 97.9°F | Resp 20 | Wt 195.8 lb

## 2012-02-20 DIAGNOSIS — C50919 Malignant neoplasm of unspecified site of unspecified female breast: Secondary | ICD-10-CM

## 2012-02-20 NOTE — Progress Notes (Signed)
   Weekly Management Note Current Dose:  12Gy  Projected Dose:  60Gy   Narrative:  The patient presents for routine under treatment assessment.  CBCT/MVCT images/Port film x-rays were reviewed.  The chart was checked. Minimal complaints today. The patient reports a little soreness in her breast.  Physical Findings:  weight is 195 lb 12.8 oz (88.814 kg). Her temperature is 97.9 F (36.6 C). Her blood pressure is 139/85 and her pulse is 84. Her respiration is 20.  early erythema over her left breast.  Impression:  The patient is tolerating radiotherapy.  Plan:  Continue radiotherapy as planned.  ________________________________   Lonie Peak, M.D.

## 2012-02-20 NOTE — Progress Notes (Signed)
Patient alert, oriented x3, weekly treatment rad l breast 6/30, no skin changes, still has some mucositis,taste changes from chemotherapy, left ankle some swelling, having hot flashes, sweating , no pain in breast, eating more salads with grilled chicken or salmon, drinking enough water stated patient, using radiaplex gel 1:45 PM

## 2012-02-21 ENCOUNTER — Ambulatory Visit
Admission: RE | Admit: 2012-02-21 | Discharge: 2012-02-21 | Disposition: A | Discharge status: Home / Self Care | Payer: 59 | Source: Ambulatory Visit | Attending: Radiation Oncology | Admitting: Radiation Oncology

## 2012-02-22 ENCOUNTER — Ambulatory Visit
Admission: RE | Admit: 2012-02-22 | Discharge: 2012-02-22 | Disposition: A | Discharge status: Home / Self Care | Payer: 59 | Source: Ambulatory Visit | Attending: Radiation Oncology | Admitting: Radiation Oncology

## 2012-02-25 ENCOUNTER — Ambulatory Visit
Admission: RE | Admit: 2012-02-25 | Discharge: 2012-02-25 | Disposition: A | Discharge status: Home / Self Care | Payer: 59 | Source: Ambulatory Visit | Attending: Radiation Oncology | Admitting: Radiation Oncology

## 2012-02-25 ENCOUNTER — Encounter: Payer: Self-pay | Admitting: Radiation Oncology

## 2012-02-25 VITALS — BP 118/78 | HR 77 | Temp 97.9°F | Resp 20 | Wt 195.3 lb

## 2012-02-25 DIAGNOSIS — C50919 Malignant neoplasm of unspecified site of unspecified female breast: Secondary | ICD-10-CM

## 2012-02-25 NOTE — Progress Notes (Signed)
Pt denies pain, does have soreness of nipple and fatigue. Applying Radiaplex to left chest wall tx area.

## 2012-02-25 NOTE — Progress Notes (Signed)
   Weekly Management Note Current Dose: 18  Gy  Projected Dose:60  Gy   Narrative:  The patient presents for routine under treatment assessment.  CBCT/MVCT images/Port film x-rays were reviewed.  The chart was checked. Doing well with no new complaints.  Physical Findings:  weight is 195 lb 4.8 oz (88.587 kg). Her oral temperature is 97.9 F (36.6 C). Her blood pressure is 118/78 and her pulse is 77. Her respiration is 20.  left breast is slightly erythematous.  Impression:  The patient is tolerating radiotherapy.  Plan:  Continue radiotherapy as planned.  ________________________________   Lonie Peak, M.D.

## 2012-02-26 ENCOUNTER — Other Ambulatory Visit (HOSPITAL_BASED_OUTPATIENT_CLINIC_OR_DEPARTMENT_OTHER): Payer: 59 | Admitting: Lab

## 2012-02-26 ENCOUNTER — Ambulatory Visit
Admission: RE | Admit: 2012-02-26 | Discharge: 2012-02-26 | Disposition: A | Discharge status: Home / Self Care | Payer: 59 | Source: Ambulatory Visit | Attending: Radiation Oncology | Admitting: Radiation Oncology

## 2012-02-26 DIAGNOSIS — C50919 Malignant neoplasm of unspecified site of unspecified female breast: Secondary | ICD-10-CM

## 2012-02-26 LAB — COMPREHENSIVE METABOLIC PANEL (CC13)
AST: 38 U/L — ABNORMAL HIGH (ref 5–34)
Albumin: 4.2 g/dL (ref 3.5–5.0)
Alkaline Phosphatase: 56 U/L (ref 40–150)
BUN: 13 mg/dL (ref 7.0–26.0)
Creatinine: 0.8 mg/dL (ref 0.6–1.1)
Glucose: 91 mg/dl (ref 70–99)
Potassium: 4.2 mEq/L (ref 3.5–5.1)
Total Bilirubin: 0.5 mg/dL (ref 0.20–1.20)

## 2012-02-26 LAB — CBC WITH DIFFERENTIAL/PLATELET
BASO%: 0.6 % (ref 0.0–2.0)
Basophils Absolute: 0 10*3/uL (ref 0.0–0.1)
EOS%: 2.2 % (ref 0.0–7.0)
HCT: 39.5 % (ref 34.8–46.6)
MCH: 34.6 pg — ABNORMAL HIGH (ref 25.1–34.0)
MCHC: 35.3 g/dL (ref 31.5–36.0)
MCV: 98.2 fL (ref 79.5–101.0)
MONO%: 8.4 % (ref 0.0–14.0)
NEUT%: 76.4 % (ref 38.4–76.8)
lymph#: 0.7 10*3/uL — ABNORMAL LOW (ref 0.9–3.3)

## 2012-02-27 ENCOUNTER — Telehealth: Payer: Self-pay | Admitting: *Deleted

## 2012-02-27 ENCOUNTER — Ambulatory Visit
Admission: RE | Admit: 2012-02-27 | Discharge: 2012-02-27 | Disposition: A | Discharge status: Home / Self Care | Payer: 59 | Source: Ambulatory Visit | Attending: Radiation Oncology | Admitting: Radiation Oncology

## 2012-02-27 NOTE — Telephone Encounter (Signed)
Notified pt to take 1000iu of vitamin D daily

## 2012-02-28 ENCOUNTER — Ambulatory Visit
Admission: RE | Admit: 2012-02-28 | Discharge: 2012-02-28 | Disposition: A | Discharge status: Home / Self Care | Payer: 59 | Source: Ambulatory Visit | Attending: Radiation Oncology | Admitting: Radiation Oncology

## 2012-02-29 ENCOUNTER — Ambulatory Visit
Admission: RE | Admit: 2012-02-29 | Discharge: 2012-02-29 | Disposition: A | Discharge status: Home / Self Care | Payer: 59 | Source: Ambulatory Visit | Attending: Radiation Oncology | Admitting: Radiation Oncology

## 2012-03-03 ENCOUNTER — Encounter: Payer: Self-pay | Admitting: Radiation Oncology

## 2012-03-03 ENCOUNTER — Other Ambulatory Visit: Payer: 59 | Admitting: Lab

## 2012-03-03 ENCOUNTER — Ambulatory Visit
Admission: RE | Admit: 2012-03-03 | Discharge: 2012-03-03 | Disposition: A | Discharge status: Home / Self Care | Payer: 59 | Source: Ambulatory Visit | Attending: Radiation Oncology | Admitting: Radiation Oncology

## 2012-03-03 ENCOUNTER — Encounter: Payer: Self-pay | Admitting: *Deleted

## 2012-03-03 ENCOUNTER — Ambulatory Visit (HOSPITAL_BASED_OUTPATIENT_CLINIC_OR_DEPARTMENT_OTHER): Payer: 59 | Admitting: Oncology

## 2012-03-03 ENCOUNTER — Telehealth: Payer: Self-pay | Admitting: *Deleted

## 2012-03-03 VITALS — BP 130/90 | HR 99 | Temp 97.8°F | Wt 193.9 lb

## 2012-03-03 VITALS — BP 137/87 | HR 67 | Temp 97.9°F | Resp 20 | Ht 67.0 in | Wt 192.8 lb

## 2012-03-03 DIAGNOSIS — C50419 Malignant neoplasm of upper-outer quadrant of unspecified female breast: Secondary | ICD-10-CM

## 2012-03-03 DIAGNOSIS — C50919 Malignant neoplasm of unspecified site of unspecified female breast: Secondary | ICD-10-CM

## 2012-03-03 DIAGNOSIS — B009 Herpesviral infection, unspecified: Secondary | ICD-10-CM

## 2012-03-03 MED ORDER — TAMOXIFEN CITRATE 20 MG PO TABS: 20.0000 mg | ORAL_TABLET | Freq: Every day | ORAL | Status: DC

## 2012-03-03 NOTE — Progress Notes (Signed)
   Weekly Management Note Current Dose:  28 Gy  Projected Dose: 60 Gy   Narrative:  The patient presents for routine under treatment assessment.  CBCT/MVCT images/Port film x-rays were reviewed.  The chart was checked. She is doing well. She does report some nausea after her treatments. She took Zofran last week and this seemed to help.  Physical Findings:  weight is 193 lb 14.4 oz (87.952 kg). Her temperature is 97.8 F (36.6 C). Her blood pressure is 130/90 and her pulse is 99.  no acute distress, sitting in a chair. She demonstrates erythema but no desquamation thus far over her left breast. There is a small erythematous lesion over her breast that she says is from a bug bite.  Impression:  The patient is tolerating radiotherapy.  Plan:  Continue radiotherapy as planned. I'm not sure of the cause of her nausea. It could be unrelated to radiotherapy or due to some scatter radiation that is affecting the bowel.  We will proceed with treatment as planned, however I recommended taking Zofran for anticipatory nausea before her treatments.  ________________________________   Lonie Peak, M.D.

## 2012-03-03 NOTE — Progress Notes (Signed)
Hematology and Oncology Follow Up Visit  Emily Knight [id number] [birth year] 56 y.o. 01/08/12  HPI: Emily Knight is a 56 year old [place] woman with a history of a T2, N1, nuclear grade 2 invasive lobular carcinoma of the left breast for which she underwent a left lumpectomy with sentinel node dissection on 08/14/2011  revealing a 2.2 cm primary lesion with 2/3 sentinal nodes involved with metastatic disease with extracapsular extension. Tumor was noted to be ER/PR positive at 20/10% respectively, HER-2 negative, with a Ki-67 of 30%. She underwent reexcision of the left axillary lymph nodes on 08/28/2011 which showed 0 of 4 nodes involved. She is participating in NSABP B-49, arm one, having completed 4 cycles of adjuvant dose dense Adriamycin/Cytoxan, due for week 12/12 planned weekly Taxol. Currently on radiation therapy.  Interim History:   . She returns for followup. She is currently third her way through the radiation treatments. She is tolerating well. She has no specific complaints. She is having quite a bit hot flashes. She is not having any significant neuropathy that seems to have improved she is able to use her hands. She has residual numbness in the soles of her feet. Overall performance status excellent ECoG 0.  Medications:   I have reviewed the patient's current medications.  Allergies:  Allergies  Allergen Reactions  . Adhesive (Tape) Other (See Comments)    BLISTERS  . Codeine Nausea And Vomiting    Physical Exam: Filed Vitals:   03/03/12 1236  BP: 137/87  Pulse: 67  Temp: 97.9 F (36.6 C)  Resp: 20    Body mass index is 30.20 kg/(m^2). ECOG 0 Weight: 193 lbs. HEENT:  Sclerae anicteric, conjunctivae pink.  Nodes:  No cervical, supraclavicular, or axillary lymphadenopathy palpated.  Lungs:  Clear to auscultation bilaterally.  No crackles, rhonchi, or wheezes.   Heart:  Regular rate and rhythm.   Abdomen:  Soft, nontender.  Positive bowel sounds.   No organomegaly or masses palpated.   Musculoskeletal:  No focal spinal tenderness to palpation.  Extremities:  Benign.  No peripheral edema or cyanosis.   Skin:  Benign.   Neuro:  Nonfocal, alert and oriented x 3, of note strength is intact. BREAST EXAM: In the supine position, with the right arm over the head, the right nipple is everted. No periareolar edema or nipple discharge. No mass in any quadrant or subareolar region. No redness of the skin. No right axillary adenopathy. With the left arm over the head, the left nipple is everted. No periareolar edema or nipple discharge. No mass in any quadrant or subareolar region. At the 4 o'clock position, a 5 cm well healed incision with volume deficit under the scar. No redness of the skin. No left axillary adenopathy.    Lab Results: Lab Results  Component Value Date   WBC 5.5 02/26/2012   HGB 13.9 02/26/2012   HCT 39.5 02/26/2012   MCV 98.2 02/26/2012   PLT 263 02/26/2012   NEUTROABS 4.2 02/26/2012     Chemistry      Component Value Date/Time   NA 139 02/26/2012 1411   NA 137 02/04/2012 0850   K 4.2 02/26/2012 1411   K 3.9 02/04/2012 0850   CL 105 02/26/2012 1411   CL 103 02/04/2012 0850   CO2 24 02/26/2012 1411   CO2 25 02/04/2012 0850   BUN 13.0 02/26/2012 1411   BUN 12 02/04/2012 0850   CREATININE 0.8 02/26/2012 1411   CREATININE 0.77 02/04/2012 0850  Component Value Date/Time   CALCIUM 9.8 02/26/2012 1411   CALCIUM 9.6 02/04/2012 0850   ALKPHOS 56 02/26/2012 1411   ALKPHOS 50 02/04/2012 0850   AST 38* 02/26/2012 1411   AST 32 02/04/2012 0850   ALT 67* 02/26/2012 1411   ALT 51* 02/04/2012 0850   BILITOT 0.50 02/26/2012 1411   BILITOT 0.3 02/04/2012 0850      Assessment:  1. Emily Knight is a 56 year old [place] woman with a history of a T2, N1 left breast cancer, on chemo with good tolerance. No evidence of recurrence clinically.  2.. History of herpes simplex on Valtrex 500mg  orally a day. No evidence of recurrence.    4. 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 Grade 1 peripheral neuropathy, on vitamin B-complex daily. 6. History of asymptomatic rise in LFTs, stable.   Plan:  Patient is doing well. She is planning a trip to the beach for 5 weeks after she completes radiation. Currently her most recent estradiol level was low at 2. Prior to this she had a fairly high estradiol levels. She is likely chemically in a premenopausal and so we discussed starting tamoxifen after she completes her radiation. I discussed side effects with her. She is status post hysterectomy as well. She does have a very low risk of thromboembolic phenomenon. I will see her back in followup in approximately 3 months time. Of note is that her  ALT is still somewhat elevated.    Emily Knight,

## 2012-03-03 NOTE — Telephone Encounter (Signed)
Gave patient appointment for 05-2012  

## 2012-03-03 NOTE — Progress Notes (Signed)
Patient in to clinic today for end of treatment evaluation. She reports side effects including ongoing ankle edema, worse when she is up on her feet for extended periods of time (grade 1) and ongoing nausea through radiation therapy, felt to be possibly related to RT, per Dr. Basilio Cairo. She reports some improvement in neuropathy in her feet, essentially unchanged in her fingers, but not interfering with fine motor function.  Patient has appointment scheduled with genetics counselor on 03/06/2012 to discuss possible testing for hereditary mutation, as she reports a maternal aunt who was diagnosed with ovarian cancer.  Per Dr. Donnie Coffin, patient will begin tamoxifen in mid-October, after radiation therapy has been completed. Patient notes that RT is due to end on 03/25/2012 and she will be leaving for [facility] for a four-week beach trip. She intends to have Dr. Dwain Sarna remove her port when she returns from her trip; per Dr. Renelda Loma nurse, Michiel Sites, port flushes will not be needed in anticipation of port removal.  MD plans interim follow-up visit in three months to assess tolerance of hormonal therapy, with next study visit due in six months.

## 2012-03-03 NOTE — Progress Notes (Signed)
14 fractions to left breast.  NO voiced complaints.  Mild erythema noted.  Has small scab on the left breast that she thinks is poison oak.  Reports that she has noticed nausea almost every time she has radiation and it does not matter whether she eats or does not eat prior to treatment.  C/o nausea presently.

## 2012-03-04 ENCOUNTER — Ambulatory Visit: Payer: 59

## 2012-03-05 ENCOUNTER — Ambulatory Visit
Admission: RE | Admit: 2012-03-05 | Discharge: 2012-03-05 | Disposition: A | Discharge status: Home / Self Care | Payer: 59 | Source: Ambulatory Visit | Attending: Radiation Oncology | Admitting: Radiation Oncology

## 2012-03-06 ENCOUNTER — Ambulatory Visit (HOSPITAL_BASED_OUTPATIENT_CLINIC_OR_DEPARTMENT_OTHER): Payer: 59 | Admitting: Genetic Counselor

## 2012-03-06 ENCOUNTER — Ambulatory Visit
Admission: RE | Admit: 2012-03-06 | Discharge: 2012-03-06 | Disposition: A | Discharge status: Home / Self Care | Payer: 59 | Source: Ambulatory Visit | Attending: Radiation Oncology | Admitting: Radiation Oncology

## 2012-03-06 ENCOUNTER — Other Ambulatory Visit: Payer: 59 | Admitting: Lab

## 2012-03-06 ENCOUNTER — Encounter: Payer: Self-pay | Admitting: Genetic Counselor

## 2012-03-06 ENCOUNTER — Other Ambulatory Visit (HOSPITAL_BASED_OUTPATIENT_CLINIC_OR_DEPARTMENT_OTHER): Payer: 59 | Admitting: Lab

## 2012-03-06 DIAGNOSIS — Z8041 Family history of malignant neoplasm of ovary: Secondary | ICD-10-CM

## 2012-03-06 DIAGNOSIS — C50419 Malignant neoplasm of upper-outer quadrant of unspecified female breast: Secondary | ICD-10-CM

## 2012-03-06 DIAGNOSIS — Z801 Family history of malignant neoplasm of trachea, bronchus and lung: Secondary | ICD-10-CM

## 2012-03-06 DIAGNOSIS — Z808 Family history of malignant neoplasm of other organs or systems: Secondary | ICD-10-CM

## 2012-03-06 DIAGNOSIS — C50919 Malignant neoplasm of unspecified site of unspecified female breast: Secondary | ICD-10-CM

## 2012-03-06 LAB — CBC WITH DIFFERENTIAL/PLATELET
BASO%: 0.7 % (ref 0.0–2.0)
EOS%: 2.7 % (ref 0.0–7.0)
HCT: 41.4 % (ref 34.8–46.6)
LYMPH%: 10.6 % — ABNORMAL LOW (ref 14.0–49.7)
MCH: 33.4 pg (ref 25.1–34.0)
MCHC: 34.6 g/dL (ref 31.5–36.0)
MCV: 96.4 fL (ref 79.5–101.0)
MONO#: 0.6 10*3/uL (ref 0.1–0.9)
MONO%: 7.6 % (ref 0.0–14.0)
NEUT%: 78.4 % — ABNORMAL HIGH (ref 38.4–76.8)
Platelets: 237 10*3/uL (ref 145–400)
RBC: 4.29 10*6/uL (ref 3.70–5.45)

## 2012-03-06 NOTE — Progress Notes (Signed)
Dr.  Pierce Crane requested a consultation for genetic counseling and risk assessment for Emily Knight, a 56 y.o. female, for discussion of her personal history of invasive lobular carcinoma and family history of ovarian, thyroid, bone lung, prostate cancer and melanoma. She presents to clinic today to discuss the possibility of a genetic predisposition to cancer, and to further clarify her risks, as well as her family members' risks for cancer.   HISTORY OF PRESENT ILLNESS: In 2013, at the age of 56, Emily Knight was diagnosed with invasive lobular carcinoma of the breast.    Past Medical History  Diagnosis Date  . Allergy   . 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 Arthritis     left knee  . Stress fracture     left foot/ankle  . Hot flushes, perimenopausal   . Cerebral aneurysm     dx by cerebral angiogram,encased in bone  . Cancer     left breast  . Breast cancer 08/14/11 [facility]    Left breast lumpectomy/left axillary lymph node resection,invasive Grade II Lobular CA in Situ,,left lymph node bx==positive for metastatic lobular ca(2/2)  . Hot flashes   . Anxiety   . History of chemotherapy     Past Surgical History  Procedure Date  . Left knee reconstruction     six surgeries (3 orthoscopic) 3 open, ACL  . Tonsillectomy and adenoidectomy 1971  . Partial hysterectomy 1984? 1989?     for fibroids   . Trigger finger release 06/29/2008    release A-1 pulley and exc. cyst left thumb  . Breast lumpectomy 08/14/11    Left breast lumpectomy, left axillary sentinel node biopsy  . Abdominal hysterectomy 1980's    ovaries intact  . Axillary lymph node dissection 08/28/2011    Procedure: AXILLARY LYMPH NODE DISSECTION;  Surgeon: Emelia Loron, MD;  Location: [facility];  Service: General;  Laterality: Left;  Left axillary node dissection, port placement  . Portacath placement 08/28/2011    Procedure: INSERTION PORT-A-CATH;   Surgeon: Emelia Loron, MD;  Location: [facility];  Service: General;  Laterality: Right;    History  Substance Use Topics  . Smoking status: Former Smoker -- 1.0 packs/day for 15 years    Types: Cigarettes    Quit date: 01/23/2001  . Smokeless tobacco: Never Used   Comment: quit smoking 10 yrs. ago  . Alcohol Use: Yes     occasionally    REPRODUCTIVE HISTORY AND PERSONAL RISK ASSESSMENT FACTORS: Menarche was at age 74.   Menopause at 50 Uterus Intact: No, TAH at 28 for fibroids Ovaries Intact: Yes [id number] , first live birth at age N/A  She has not previously undergone treatment for infertility.   Never used OCPs   She has used HRT in the past.    FAMILY HISTORY:  We obtained a detailed, 4-generation family history.  Significant diagnoses are listed below: Family History  Problem Relation Age of Onset  . Anesthesia problems Mother     post-op N/V  . Cancer Father 72    lung - ages 60 and 7; prostate  . Cancer Maternal Aunt 58    ovarian  . Cancer Paternal Aunt     lung  . Cancer Paternal Uncle     bone cancer  . Melanoma Cousin 25    female ,skin  The patient was diagnosed with lobular breast cancer.  Her father was diagnosed  with an adenocarcinoma of the lung at ages 25 and 37.  He had a brother with bone cancer, whose daughter was also diagnosed with melanoma.  He also had a sister who was a smoker who was diagnosed with lung cancer.  The patient's maternal aunt was diagnosed with ovarian cancer, and another aunt (paternal half sister to her mother) was diagnosed with thyroid cancer.  Patient's maternal ancestors are of [place] and unknown descent, and paternal ancestors are of unknown descent. There is no reported Ashkenazi Jewish ancestry. There is no  known consanguinity.  GENETIC COUNSELING RISK ASSESSMENT, DISCUSSION, AND SUGGESTED FOLLOW UP: We reviewed the natural history and genetic etiology of sporadic, familial and hereditary cancer  syndromes.  About 5-10% of breast cancer is hereditary.  Of this, about 85% is the result of a BRCA1 or BRCA2 mutation.  We reviewed the red flags of hereditary cancer syndromes and the dominant inheritance patterns.  If the BRCA testing is negative, we discussed that we could be testing for the wrong gene.  We discussed gene panels, and that several cancer genes that are associated with different cancers can be tested at the same time.  Because of the different types of cancer that are in the patient's family, we will consider the BRCAPlus test.   The patient's personal history of breast cancer and family history of ovarian cancer is suggestive of the following possible diagnosis: hereditary cancer syndrome  We discussed that identification of a hereditary cancer syndrome may help her care providers tailor the patients medical management. If a mutation indicating a hereditary cancer syndrome is detected in this case, the [employer] recommendations would include increased cancer surveillance and possible prophylactic surgery. If a mutation is detected, the patient will be referred back to the referring provider and to any additional appropriate care providers to discuss the relevant options.   If a mutation is not found in the patient, this will decrease the likelihood of a hereditary cancer syndrome as the explanation for her breast cancer. Cancer surveillance options would be discussed for the patient according to the appropriate standard National Comprehensive Cancer Network and American Cancer Society guidelines, with consideration of their personal and family history risk factors. In this case, the patient will be referred back to their care providers for discussions of management.   In order to estimate her chance of having a BRCA1 or BRCA2 mutation, we used statistical models (Penn II) and laboratory data that take into account her personal medical history, family history  and ancestry.  Because each model is different, there can be a lot of variability in the risks they give.  Therefore, these numbers must be considered a rough range and not a precise risk of having a BRCA1 or BRCA2 mutation.  These models estimate that she has approximately a 8% chance of having a mutation. Based on this assessment of her family and personal history, genetic testing is recommended.  After considering the risks, benefits, and limitations, the patient provided informed consent for  the following  Testing:  BRCAPlus through [employer].   Per the patient's request, we will contact her by telephone to discuss these results. A follow up genetic counseling visit will be scheduled if indicated.  The patient was seen for a total of 60 minutes, greater than 50% of which was spent face-to-face counseling.  This plan is being carried out per Dr. Renelda Loma recommendations.  This note will also be sent to the referring provider via the  electronic medical record. The patient will be supplied with a summary of this genetic counseling discussion as well as educational information on the discussed hereditary cancer syndromes following the conclusion of their visit.   Patient was discussed with Dr. Drue Second.   _______________________________________________________________________ For Office Staff:  Number of people involved in session: 2 Was an Intern/ student involved with case: no

## 2012-03-07 ENCOUNTER — Ambulatory Visit
Admission: RE | Admit: 2012-03-07 | Discharge: 2012-03-07 | Disposition: A | Discharge status: Home / Self Care | Payer: 59 | Source: Ambulatory Visit | Attending: Radiation Oncology | Admitting: Radiation Oncology

## 2012-03-07 ENCOUNTER — Ambulatory Visit: Payer: 59 | Admitting: Oncology

## 2012-03-10 ENCOUNTER — Ambulatory Visit
Admission: RE | Admit: 2012-03-10 | Discharge: 2012-03-10 | Disposition: A | Discharge status: Home / Self Care | Payer: 59 | Source: Ambulatory Visit | Attending: Radiation Oncology | Admitting: Radiation Oncology

## 2012-03-11 ENCOUNTER — Ambulatory Visit
Admission: RE | Admit: 2012-03-11 | Discharge: 2012-03-11 | Disposition: A | Discharge status: Home / Self Care | Payer: 59 | Source: Ambulatory Visit | Attending: Radiation Oncology | Admitting: Radiation Oncology

## 2012-03-12 ENCOUNTER — Encounter: Payer: Self-pay | Admitting: Radiation Oncology

## 2012-03-12 ENCOUNTER — Ambulatory Visit
Admission: RE | Admit: 2012-03-12 | Discharge: 2012-03-12 | Disposition: A | Discharge status: Home / Self Care | Payer: 59 | Source: Ambulatory Visit | Attending: Radiation Oncology | Admitting: Radiation Oncology

## 2012-03-12 VITALS — BP 147/86 | HR 78 | Temp 97.9°F | Wt 193.4 lb

## 2012-03-12 DIAGNOSIS — C50919 Malignant neoplasm of unspecified site of unspecified female breast: Secondary | ICD-10-CM

## 2012-03-12 MED ORDER — LORAZEPAM 0.5 MG PO TABS: 0.5000 mg | ORAL_TABLET | Freq: Every day | ORAL | Status: DC | PRN

## 2012-03-12 NOTE — Progress Notes (Deleted)
VERIFICATION SIMULATION NOTE  The patient was laid in the correct position on the treatment table for simulation verification. Portal imaging was obtained and I verified the fields and MLCs for her breast boost treatment to be accurate. The patient tolerated the procedure well.  -----------------------------------------------------  Courtney Fenlon, MD   

## 2012-03-12 NOTE — Progress Notes (Signed)
   Weekly Management Note Current Dose:  40 Gy  Projected Dose: 60 Gy   Narrative:  The patient presents for routine under treatment assessment.  CBCT/MVCT images/Port film x-rays were reviewed.  The chart was checked. She is a little tired but otherwise is with minimal complaints related to her radiotherapy. She does have some nausea after radiotherapy which responds to Zofran, but she forgot to take the Zofran today. It has been recommended that she start taking her Zofran prior to treatment.  Physical Findings:  weight is 193 lb 6.4 oz (87.726 kg). Her temperature is 97.9 F (36.6 C). Her blood pressure is 147/86 and her pulse is 78.  left breast is erythematous with skin intact.  Impression:  The patient is tolerating radiotherapy.  Plan:  Continue radiotherapy as planned.  ________________________________   Lonie Peak, M.D.

## 2012-03-12 NOTE — Progress Notes (Signed)
20/25 fractions to whole breast.  C/o Fatigue today.  Mild erythema noted in left breast with a small scab and erythema noted in axilla as well.  She reports tightness in the left axilla. Continues to have intermittent nausea following each radiation therapy for at  Least 2 hrs.  Forgot to take Zofran today.  Suggested that if she takes her Zofran prior to treatment to take it at least 90 minutes prior.

## 2012-03-13 ENCOUNTER — Ambulatory Visit
Admission: RE | Admit: 2012-03-13 | Discharge: 2012-03-13 | Disposition: A | Discharge status: Home / Self Care | Payer: 59 | Source: Ambulatory Visit | Attending: Radiation Oncology | Admitting: Radiation Oncology

## 2012-03-14 ENCOUNTER — Ambulatory Visit
Admission: RE | Admit: 2012-03-14 | Discharge: 2012-03-14 | Disposition: A | Discharge status: Home / Self Care | Payer: 59 | Source: Ambulatory Visit | Attending: Radiation Oncology | Admitting: Radiation Oncology

## 2012-03-17 ENCOUNTER — Ambulatory Visit
Admission: RE | Admit: 2012-03-17 | Discharge: 2012-03-17 | Disposition: A | Discharge status: Home / Self Care | Payer: 59 | Source: Ambulatory Visit | Attending: Radiation Oncology | Admitting: Radiation Oncology

## 2012-03-17 ENCOUNTER — Encounter: Payer: Self-pay | Admitting: Radiation Oncology

## 2012-03-17 VITALS — BP 108/81 | HR 76 | Temp 98.0°F | Resp 20 | Wt 192.8 lb

## 2012-03-17 DIAGNOSIS — C50919 Malignant neoplasm of unspecified site of unspecified female breast: Secondary | ICD-10-CM

## 2012-03-17 NOTE — Progress Notes (Signed)
   Weekly Management Note Current Dose:  46 Gy  Projected Dose:  60 Gy   Narrative:  The patient presents for routine under treatment assessment.  CBCT/MVCT images/Port film x-rays were reviewed.  The chart was checked. Doing well. Tired after doing a lot of Cooking for her friends this weekend.  Physical Findings:  weight is 192 lb 12.8 oz (87.454 kg). Her oral temperature is 98 F (36.7 C). Her blood pressure is 108/81 and her pulse is 76. Her respiration is 20.  left breast is diffusely erythematous. Skin is intact.  Impression:  The patient is tolerating radiotherapy.  Plan:  Continue radiotherapy as planned.  ________________________________   Lonie Peak, M.D.

## 2012-03-17 NOTE — Progress Notes (Signed)
Patient here weekly rad txs left breast 23/30 txs completed, erythema  Skin intact, patient worked 12 hours Saturday and a lot yesterday, tired and fatigued today, eating and drinking plenty fluids, "Wore out",  No c/o pain 1:51 PM

## 2012-03-18 ENCOUNTER — Ambulatory Visit
Admission: RE | Admit: 2012-03-18 | Discharge: 2012-03-18 | Disposition: A | Discharge status: Home / Self Care | Payer: 59 | Source: Ambulatory Visit | Attending: Radiation Oncology | Admitting: Radiation Oncology

## 2012-03-19 ENCOUNTER — Ambulatory Visit
Admission: RE | Admit: 2012-03-19 | Discharge: 2012-03-19 | Disposition: A | Discharge status: Home / Self Care | Payer: 59 | Source: Ambulatory Visit | Attending: Radiation Oncology | Admitting: Radiation Oncology

## 2012-03-19 ENCOUNTER — Encounter: Payer: Self-pay | Admitting: Radiation Oncology

## 2012-03-20 ENCOUNTER — Ambulatory Visit
Admission: RE | Admit: 2012-03-20 | Discharge: 2012-03-20 | Disposition: A | Discharge status: Home / Self Care | Payer: 59 | Source: Ambulatory Visit | Attending: Radiation Oncology | Admitting: Radiation Oncology

## 2012-03-21 ENCOUNTER — Encounter: Payer: Self-pay | Admitting: Radiation Oncology

## 2012-03-21 ENCOUNTER — Ambulatory Visit
Admission: RE | Admit: 2012-03-21 | Discharge: 2012-03-21 | Disposition: A | Discharge status: Home / Self Care | Payer: 59 | Source: Ambulatory Visit | Attending: Radiation Oncology | Admitting: Radiation Oncology

## 2012-03-24 ENCOUNTER — Telehealth: Payer: Self-pay | Admitting: *Deleted

## 2012-03-24 ENCOUNTER — Ambulatory Visit
Admission: RE | Admit: 2012-03-24 | Discharge: 2012-03-24 | Disposition: A | Discharge status: Home / Self Care | Payer: 59 | Source: Ambulatory Visit | Attending: Radiation Oncology | Admitting: Radiation Oncology

## 2012-03-24 ENCOUNTER — Encounter: Payer: Self-pay | Admitting: Radiation Oncology

## 2012-03-24 VITALS — BP 129/88 | HR 82 | Temp 97.5°F | Wt 191.0 lb

## 2012-03-24 DIAGNOSIS — C50919 Malignant neoplasm of unspecified site of unspecified female breast: Secondary | ICD-10-CM

## 2012-03-24 DIAGNOSIS — C50419 Malignant neoplasm of upper-outer quadrant of unspecified female breast: Secondary | ICD-10-CM | POA: Insufficient documentation

## 2012-03-24 MED ORDER — BIAFINE EX EMUL
CUTANEOUS | Status: DC | PRN
  Administered 2012-03-24: 14:00:00 via TOPICAL

## 2012-03-24 NOTE — Progress Notes (Signed)
   Weekly Management Note Current Dose:  56 Gy  Projected Dose:  60 Gy   Narrative:  The patient presents for routine under treatment assessment.  CBCT/MVCT images/Port film x-rays were reviewed.  The chart was checked. Emily Knight came in today before RT with constant stinging pain in the left axilla with an associated moist desquamation. She states this area was blistered over the weekend then they began to burst and she feels the area is bigger. Bright erythema and pain in the left SCV region and left lower neck. She took Oxycodone and Advil the am "to take the edge off".    Physical Findings:  weight is 191 lb (86.637 kg). Her temperature is 97.5 F (36.4 C). Her blood pressure is 129/88 and her pulse is 82.  no acute distress. Bright erythema in the  left axilla and left supraclavicular region. She has dry desquamation in the left supraclavicular region and resolving moist desquamation in the left axillary region. No sign of infection.   Impression:  The patient is tolerating radiotherapy.  Plan:  Continue radiotherapy as planned. Recommended Aleve for pain and inflammation. She can use Percocet as well as needed. We will give her hydrogel pads and Biafine today, as well. I asked my nurse to have the patient be sent back to clinic on Wednesday and I will check her on the last day of treatment.  ________________________________   Emily Knight, M.D.

## 2012-03-24 NOTE — Addendum Note (Signed)
Encounter addended by: Delynn Flavin, RN on: 03/24/2012  2:28 PM<BR>     Documentation filed: Inpatient MAR

## 2012-03-24 NOTE — Telephone Encounter (Signed)
Emily Knight reports blisters in the axillary region which have burst and are draining as well as blisters in the supraclavicular region in the treatment field.  She reports greenish drainage from the [place] blister.  She spoke with Dr. Mitzi Hansen this weekend and he ordered her to use Neosporin /Triple antibiotic ointment in these areas.  She will arrive today at 1300 to see Dr. Basilio Cairo prior to treatment today.

## 2012-03-24 NOTE — Progress Notes (Signed)
Emily Knight in today with constant stinging pain in the left axilla with an associated moist desquamation.  She states this area was blistered over the weekend then they began to burst and she feels the area is bigger. Bright erythema and pain in the left [place] region and left lower neck.  She took Oxycodone and Advil the am "to take the edge off".    Grades pain as higher than a level 10/

## 2012-03-24 NOTE — Patient Instructions (Signed)
Breast Cancer Survivor Follow-Up  Breast cancer begins when cells in the breast divide too rapidly. The extra cells form a lump (tumor). When the cancer is treated, the goal is to get rid of all cancer cells. However, sometimes a few cells survive. These cancer cells can then grow. They become recurrent cancer. This means the cancer comes back after treatment.   Most cases of recurrent breast cancer develop 3 to 5 years after treatment. However, sometimes it comes back just a few months after treatment. Other times, it does not come back until years later. If the cancer comes back in the same area as the first breast cancer, it is called a local recurrence. If the cancer comes back somewhere else in the body, it is called regional recurrence if the site is fairly near the breast or distant recurrence if it is far from the breast. Your caregiver may also use the term metastasize to indicate a cancer that has gone to another part of your body. Treatment is still possible after either kind of recurrence. The cancer can still be controlled.   CAUSES OF RECURRENT CANCER  No one knows exactly why breast cancer starts in the first place. Why the cancer comes back after treatment is also not clear. It is known that certain conditions, called risk factors, can make this more likely. They include:  · Developing breast cancer for the first time before age 60.  · Having breast cancer that involves the lymph nodes. These are small, round pieces of tissue found all over the body. Their job is to help fight infections.  · Having a large tumor. Cancer is more apt to come back if the first tumor was bigger than 2 inches (5 cm).  · Having certain types of breast cancer, such as:  · Inflammatory breast cancer. This rare type grows rapidly and causes the breast to become red and swollen.  · A high-grade tumor. The grade of a tumor indicates how fast it will grow and spread. High-grade tumors grow more quickly than other types.  · HER2  cancer. This refers to the tumor's genetic makeup. Tumors that have this type of gene are more likely to come back after treatment.  · Having close tumor margins. This refers to the space between the tumor and normal, noncancerous cells. If the space is small, the tumor has a greater chance of coming back.  · Having treatment involving a surgery to remove the tumor but not the entire breast (lumpectomy) and no radiation therapy.  CARE AFTER BREAST CANCER  Home Monitoring  Women who have had breast cancer should continue to examine their breasts every month. The goal is to catch the cancer quickly if it comes back. Many women find it helpful to do so on the same day each month and to mark the calendar as a reminder. Let your caregiver know immediately if you have any signs of recurrent breast cancer. Symptoms will vary, depending on where the cancer recurs. The original type of treatment can also make a difference.  Symptoms of local recurrence after a lumpectomy or a recurrence in the opposite breast may include:  · A new lump or thickening in the breast.  · A change in the way the skin looks on the breast (such as a rash, dimpling, or wrinkling).  · Redness or swelling of the breast.  · Changes in the nipple (such as being red, puckered, swollen, or leaking fluid).  Symptoms of a recurrence after a   breast removal surgery (mastectomy) may include:  · A lump or thickening under the skin.  · A thickening around the mastectomy scar.  Symptoms of regional recurrence in the lymph nodes near the breast may include:  · A lump under the arm or above the collarbone.  · Swelling of the arm.  · Pain in the arm, shoulder, or chest.  · Numbness in the hand or arm.  Symptoms of distant recurrence may include:  · A cough that does not go away.  · Trouble breathing or shortness of breath.  · Pain in the bones or the chest. This is pain that lasts or does not respond to rest and medicine.  · Headaches.  · Sudden vision  problems.  · Dizziness.  · Nausea or vomiting.  · Losing weight without trying to.  · Persistent abdominal pain.  · Changes in bowel movements or blood in the stool.  · Yellowing of the skin or eyes (jaundice).  · Blood in the urine or bloody vaginal discharge.  Clinical Monitoring  · It is helpful to keep a schedule of appointments for needed tests and exams. This includes physical exams, breast exams, exams of the lymph nodes, and general exams.  · For the first 3 years after being treated for breast cancer, see your caregiver every 3 to 6 months.  · For years 4 and 5 after breast cancer, see your caregiver every 6 to 12 months.  · After 5 years, see your caregiver at least once a year.  · Regular breast X-rays (mammograms) should continue even if you had a mastectomy.  · A mammogram should be done 1 year after the mammogram that first detected breast cancer.  · A mammogram should be done every 6 to 12 months after that. Follow your caregiver's advice.  · A pelvic exam done by your caregiver checks whether female organs are the normal size and shape. The exam is usually done every year. Ask your caregiver if that schedule is right for you.  · Women taking tamoxifen should report any vaginal bleeding immediately to their caregiver. Tamoxifen is often given to women with a certain type of breast cancer. It has been shown to help prevent recurrence.  · You will need to decide who your primary caregiver will be.  · Most people continue to see their cancer specialist (oncologist) every 3 to 6 months for the first year after cancer treatment.  · At some point, you may want to go back to seeing your family caregiver. You would no longer see your oncologist for regular checkups. Many women do this about 1 year after their first diagnosis of breast cancer.  · You will still need to be seen every so often by your oncologist. Ask how often that should be. Coordinate this with your family or primary caregiver.  · Think about  having genetic counseling. This would provide information on traits that can be passed or inherited from one generation to the next. In some cases, breast cancer runs in families. Tell your caregiver if you:  · Are of Ashkenazi Jewish heritage.  · Have any family member who has had ovarian cancer.  · Have a mother, sister, or daughter who had breast cancer before age 50.  · Have 2 or more close relatives who have had breast cancer. This means a mother, sister, daughter, aunt, or grandmother.  · Had breast cancer in both breasts.  · Have a female relative who has had breast cancer.  ·   Some tests are not recommended for routine screening. Someone recovering from breast cancer does not need to have these tests if there are no problems. The tests have risks, such as radiation exposure, and can be costly. The risks of these tests are thought to be greater than the benefits:  · Blood tests.  · Chest X-rays.  · Bone scans.  · Liver ultrasound.  · Computed tomography (CT scan).  · Positron emission tomography (PET scan).  · Magnetic resonance imaging (MRI scan).  DIAGNOSIS OF RECURRENT CANCER  Recurrent breast cancer may be suspected for various reasons. A mammogram may not look normal. You might feel a lump or have other symptoms. Your caregiver may find something unusual during an exam. To be sure, your caregiver will probably order some tests. The tests are needed because there are symptoms or hints of a problem. They could include:  · Blood tests, including a test to check how well the liver is working. The liver is a common site for a distant cancer recurrence.  · Imaging tests that create pictures of the inside of the body. These tests include:  · Chest X-rays to show if the cancer has come back in the lungs.  · CT scans to create detailed pictures of various areas of the body and help find a distant recurrence.  · MRI scans to find anything unusual in the breast, chest, or lymph nodes.  · Breast ultrasound tests to  examine the breasts.  · Bone scans to create a picture of your whole skeleton and find cancer in bony areas.  · PET scans to create an image of the whole body. PET scans can be used together with CT scans to show more detail.  · Biopsy. A small sample of tissue is taken and checked under a microscope. If cancer cells are found, they may be tested to see if they contain the HER2 gene or the hormones estrogen and progesterone. This will help your caregiver decide how to treat the recurrent cancer.  TREATMENT   How recurrent breast cancer is treated depends on where the new cancer is found. The type of treatment that was used for the first breast cancer makes a difference, too. A combination of treatments may be used. Options include:  · Surgery.  · If the cancer comes back in the breast that was not treated before, you may need a lumpectomy or mastectomy.  · If the cancer comes back in the breast that was treated before, you may need a mastectomy.  · The lymph nodes under the arm may need to be removed.  · Radiation therapy.  · For a local recurrence, radiation may be used if it was not used during the first treatment.  · For a distance recurrence, radiation is sometimes used.  · Chemotherapy.  · This may be used before surgery to treat recurrent breast cancer.  · This may be used to treat recurrent cancer that cannot be treated with surgery.  · This may be used to treat a distant recurrence.  · Hormone therapy.  · Women with the HER2 gene may be given hormone therapy to attack this gene.  Document Released: 01/31/2011 Document Revised: 08/27/2011 Document Reviewed: 01/31/2011  Exit[employer]® Patient Information ©2013 [employer].

## 2012-03-25 ENCOUNTER — Ambulatory Visit
Admission: RE | Admit: 2012-03-25 | Discharge: 2012-03-25 | Disposition: A | Discharge status: Home / Self Care | Payer: 59 | Source: Ambulatory Visit | Attending: Radiation Oncology | Admitting: Radiation Oncology

## 2012-03-26 ENCOUNTER — Encounter: Payer: Self-pay | Admitting: Radiation Oncology

## 2012-03-26 ENCOUNTER — Ambulatory Visit
Admission: RE | Admit: 2012-03-26 | Discharge: 2012-03-26 | Disposition: A | Discharge status: Home / Self Care | Payer: 59 | Source: Ambulatory Visit | Attending: Radiation Oncology | Admitting: Radiation Oncology

## 2012-03-26 ENCOUNTER — Ambulatory Visit: Payer: 59

## 2012-03-27 ENCOUNTER — Ambulatory Visit: Payer: 59

## 2012-03-28 ENCOUNTER — Ambulatory Visit: Payer: 59

## 2012-03-31 ENCOUNTER — Telehealth: Payer: Self-pay | Admitting: Genetic Counselor

## 2012-03-31 NOTE — Progress Notes (Signed)
  Radiation Oncology         [phone]) [phone] ________________________________  Name: Emily Knight MRN: [id number]  Date: 03/19/2012  DOB: [birth year]  Simulation Verification Note - BREAST BOOST  Status: OUTPATIENT  NARRATIVE: The patient was brought to the treatment unit and placed in the planned treatment position. The clinical setup was verified. Then port films were obtained and uploaded to the radiation oncology medical record software.  The treatment beams were carefully compared against the planned radiation fields. The position location and shape of the radiation fields was reviewed. They targeted volume of tissue appears to be appropriately covered by the radiation beams. Organs at risk appear to be excluded as planned.  Based on my personal review, I approved the simulation verification. The patient's treatment will proceed as planned.  -----------------------------------  Lonie Peak, MD

## 2012-03-31 NOTE — Progress Notes (Signed)
  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 ER 20% PR 10% HER-2/neu negative.   Indication for treatment: curative      Radiation treatment dates:   02/12/2012-03/26/2012  Site/dose:    #1 left supraclavicular/50 Wallace Cullens in 25 fractions #2 posterior axillary boost/ 11.5 Gray in 25 fractions #3 left breast/50 Wallace Cullens in 25 fractions #4 left breast boost/10 Wallace Cullens in 5 fractions   Beams/energy:    #1 right anterior oblique, breath-hold /10 MV photons  #2 posterior, breath-hold / 10 MV photons  #3 opposed tangents, breath-hold / 6,10  and 18 MV photons  #4  three-field, free breathing / 6 and 10 MV photons   Narrative: The patient tolerated radiation treatment relatively well.    At the end of treatment, she had Bright erythema in the left axilla and left supraclavicular region. She had dry desquamation in the left supraclavicular region and resolving moist desquamation in the left axillary region. No sign of infection.   Plan: The patient has completed radiation treatment. The patient will return to radiation oncology clinic for routine followup in one month. I advised them to call or return sooner if they have any questions or concerns related to their recovery or treatment.  -----------------------------------  Lonie Peak, MD

## 2012-03-31 NOTE — Telephone Encounter (Signed)
Revealed negative BRCAPlus testing.   

## 2012-04-04 ENCOUNTER — Telehealth: Payer: Self-pay | Admitting: Genetic Counselor

## 2012-04-04 NOTE — Telephone Encounter (Signed)
error 

## 2012-04-09 ENCOUNTER — Encounter: Payer: Self-pay | Admitting: Genetic Counselor

## 2012-04-23 ENCOUNTER — Telehealth: Payer: Self-pay | Admitting: *Deleted

## 2012-04-23 NOTE — Telephone Encounter (Addendum)
04/23/2012 Left message on patient's home voice mail asking that she contact me regarding the date that she started taking tamoxifen. Left my direct call back number [phone].  04/25/2012 Spoke with patient by phone today who confirms that she began taking tamoxifen on 04/10/2012, two weeks following the end of radiation therapy.

## 2012-05-01 ENCOUNTER — Encounter: Payer: Self-pay | Admitting: Radiation Oncology

## 2012-05-02 ENCOUNTER — Ambulatory Visit
Admission: RE | Admit: 2012-05-02 | Discharge: 2012-05-02 | Disposition: A | Discharge status: Home / Self Care | Payer: 59 | Source: Ambulatory Visit | Attending: Radiation Oncology | Admitting: Radiation Oncology

## 2012-05-02 ENCOUNTER — Encounter: Payer: Self-pay | Admitting: Radiation Oncology

## 2012-05-02 VITALS — BP 123/87 | HR 91 | Temp 98.2°F | Wt 192.1 lb

## 2012-05-02 DIAGNOSIS — C50419 Malignant neoplasm of upper-outer quadrant of unspecified female breast: Secondary | ICD-10-CM

## 2012-05-02 HISTORY — DX: Personal history of irradiation: Z92.3

## 2012-05-02 HISTORY — DX: Long term (current) use of selective estrogen receptor modulators (serms): Z79.810

## 2012-05-02 NOTE — Patient Instructions (Signed)
Call if you have questions or concerns

## 2012-05-02 NOTE — Progress Notes (Signed)
FU today following Radiation Therapy to her left breast.  Denies any pain in her breast but reports that she is having increased joint pain in her knees and hips bilaterally.  Currently taking Tamoxifen and is experiencing frequent "extreme" hot flashes to the point that she states "something has to be done".  She is currently helping to manage her father's care since he has been diagnosed with cancer and states this is "wearing".

## 2012-05-02 NOTE — Progress Notes (Signed)
Radiation Oncology         [phone]) [phone] ________________________________  Name: Emily Knight MRN: [id number]  Date: 05/02/2012  DOB: April 20, 1956  Follow-Up Visit Note  Diagnosis:   Pathologic T2 N1a M0 stage IIB invasive lobular carcinoma of the upper-outer quadrant of the left breast, ER 20% PR 10% HER-2/neu negative  Interval Since Last Radiation:  She completed radiotherapy on 03/26/2012 , receiving 50 Gray in 25 fractions to the supraclavicular axillary and breast region; boost treatment of 10 Gray in 5 fractions to lumpectomy cavity  Narrative:  The patient returns today for routine follow-up.  She is doing relatively well. Her main complaint is hot flashes since starting antiestrogen therapy. She is on tamoxifen. Skin has healed well.                        ALLERGIES:  is allergic to adhesive and codeine.  Meds: Current Outpatient Prescriptions  Medication Sig Dispense Refill  . B Complex-C (B-COMPLEX WITH VITAMIN C) tablet Take 1 tablet by mouth 2 (two) times daily. Initiated at one tablet daily; increased dose to BID 01/15/2012 per Dr. Donnie Coffin.      . cholecalciferol (VITAMIN D) 1000 UNITS tablet Take 1,000 Units by mouth daily.      Marland Kitchen LORazepam (ATIVAN) 0.5 MG tablet Take 1 tablet (0.5 mg total) by mouth daily as needed for anxiety.  30 tablet  0  . naproxen sodium (ANAPROX) 220 MG tablet Take 220 mg by mouth 2 (two) times daily with a meal.      . tamoxifen (NOLVADEX) 20 MG tablet Take 1 tablet (20 mg total) by mouth daily.  30 tablet  6  . lidocaine-prilocaine (EMLA) cream Apply 1 application topically as needed.       No current facility-administered medications for this encounter.   Facility-Administered Medications Ordered in Other Encounters  Medication Dose Route Frequency Provider Last Rate Last Dose  . lidocaine-prilocaine (EMLA) cream   Topical Once Pierce Crane, MD        Physical Findings: The patient is in no acute distress. Patient is alert and oriented.  weight is 192 lb 1.6 oz (87.136 kg). Her temperature is 98.2 F (36.8 C). Her blood pressure is 123/87 and her pulse is 91. .  No significant changes. Left breast and low neck is healed well with minimal residual hyperpigmentation. No arm edema.  Lab Findings: Lab Results  Component Value Date   WBC 7.8 03/06/2012   HGB 14.3 03/06/2012   HCT 41.4 03/06/2012   MCV 96.4 03/06/2012   PLT 237 03/06/2012    CMP     Component Value Date/Time   NA 139 02/26/2012 1411   NA 137 02/04/2012 0850   K 4.2 02/26/2012 1411   K 3.9 02/04/2012 0850   CL 105 02/26/2012 1411   CL 103 02/04/2012 0850   CO2 24 02/26/2012 1411   CO2 25 02/04/2012 0850   GLUCOSE 91 02/26/2012 1411   GLUCOSE 114* 02/04/2012 0850   BUN 13.0 02/26/2012 1411   BUN 12 02/04/2012 0850   CREATININE 0.8 02/26/2012 1411   CREATININE 0.77 02/04/2012 0850   CALCIUM 9.8 02/26/2012 1411   CALCIUM 9.6 02/04/2012 0850   PROT 6.6 02/26/2012 1411   PROT 6.5 02/04/2012 0850   ALBUMIN 4.2 02/26/2012 1411   ALBUMIN 3.8 02/04/2012 0850   AST 38* 02/26/2012 1411   AST 32 02/04/2012 0850   ALT 67* 02/26/2012 1411   ALT 51* 02/04/2012  0850   ALKPHOS 56 02/26/2012 1411   ALKPHOS 50 02/04/2012 0850   BILITOT 0.50 02/26/2012 1411   BILITOT 0.3 02/04/2012 0850   GFRNONAA >90 08/09/2011 1600   GFRAA >90 08/09/2011 1600      Radiographic Findings: No results found.  Impression/Plan:  The patient is recovering from the effects of radiation.  I encouraged her to continue with yearly mammography and followup with medical oncology. I will see her back on an as-needed basis. I have encouraged her to call if she has any issues or concerns in the future. I wished her the very best.  _____________________________________   Lonie Peak, MD

## 2012-05-19 ENCOUNTER — Ambulatory Visit (INDEPENDENT_AMBULATORY_CARE_PROVIDER_SITE_OTHER): Payer: 59 | Admitting: General Surgery

## 2012-05-21 ENCOUNTER — Other Ambulatory Visit (INDEPENDENT_AMBULATORY_CARE_PROVIDER_SITE_OTHER): Payer: Self-pay | Admitting: General Surgery

## 2012-05-21 ENCOUNTER — Telehealth (INDEPENDENT_AMBULATORY_CARE_PROVIDER_SITE_OTHER): Payer: Self-pay | Admitting: General Surgery

## 2012-05-21 NOTE — Telephone Encounter (Signed)
Spoke with pt and she informed me that she has given the okay by Dr. Willa Rough to remove her PAC.  She would like it removed by the end of the year ( I informed her that we would try, but could not guarantee).  She was supposed to be seen on 12/2 for a breast recheck and to discuss this but had to cancel due to being sick.  I have reschedule her an appt on 12/17 to recheck her breast and to also check her hopefully before a surgery date.  I informed her that I have to get Dr. Dwain Sarna to fill out orders, but as soon as he does that our surgery schedulers will contact her to set up the surgery date.

## 2012-05-21 NOTE — Telephone Encounter (Signed)
I will put orders in and schedule under mac.  Doesn't need to see me first.

## 2012-05-21 NOTE — Telephone Encounter (Signed)
Message copied by Littie Deeds on Wed May 21, 2012 10:04 AM ------      Message from: Cathi Roan      Created: Tue May 20, 2012  1:10 PM      Regarding: for port removal      Contact: [phone]       Patient had appt 05/19/12 canceled because of virus. She said Dr. Dwain Sarna told her to give [place] a call when she was ready to have port removed. I offered to make appt.

## 2012-05-22 ENCOUNTER — Encounter: Payer: Self-pay | Admitting: *Deleted

## 2012-05-22 ENCOUNTER — Telehealth: Payer: Self-pay | Admitting: *Deleted

## 2012-05-22 ENCOUNTER — Telehealth (INDEPENDENT_AMBULATORY_CARE_PROVIDER_SITE_OTHER): Payer: Self-pay | Admitting: General Surgery

## 2012-05-22 ENCOUNTER — Ambulatory Visit (HOSPITAL_BASED_OUTPATIENT_CLINIC_OR_DEPARTMENT_OTHER): Payer: 59 | Admitting: Oncology

## 2012-05-22 ENCOUNTER — Other Ambulatory Visit (HOSPITAL_BASED_OUTPATIENT_CLINIC_OR_DEPARTMENT_OTHER): Payer: 59 | Admitting: Lab

## 2012-05-22 VITALS — BP 152/79 | HR 62 | Temp 97.7°F | Resp 20 | Ht 67.0 in | Wt 191.9 lb

## 2012-05-22 DIAGNOSIS — Z170 Estrogen receptor positive status [ER+]: Secondary | ICD-10-CM

## 2012-05-22 DIAGNOSIS — C50419 Malignant neoplasm of upper-outer quadrant of unspecified female breast: Secondary | ICD-10-CM

## 2012-05-22 DIAGNOSIS — C50919 Malignant neoplasm of unspecified site of unspecified female breast: Secondary | ICD-10-CM

## 2012-05-22 LAB — CBC WITH DIFFERENTIAL/PLATELET
BASO%: 0.6 % (ref 0.0–2.0)
EOS%: 2.7 % (ref 0.0–7.0)
LYMPH%: 19.8 % (ref 14.0–49.7)
MCHC: 35.4 g/dL (ref 31.5–36.0)
MONO#: 0.4 10*3/uL (ref 0.1–0.9)
RBC: 4.46 10*6/uL (ref 3.70–5.45)
WBC: 4.7 10*3/uL (ref 3.9–10.3)
lymph#: 0.9 10*3/uL (ref 0.9–3.3)

## 2012-05-22 MED ORDER — LORAZEPAM 0.5 MG PO TABS: 0.5000 mg | ORAL_TABLET | Freq: Every day | ORAL | Status: DC | PRN

## 2012-05-22 MED ORDER — VENLAFAXINE HCL 37.5 MG PO TABS: 37.5000 mg | ORAL_TABLET | Freq: Two times a day (BID) | ORAL | Status: DC

## 2012-05-22 NOTE — Telephone Encounter (Signed)
Spoke with nancy at the rehab and she confirmed she has received the referral and will give the patient an call

## 2012-05-22 NOTE — Telephone Encounter (Signed)
Spoke with nancy at the rehab and she confirmed she has received the referral and will give the patient an call  Gave patient appointment for three months in 08-2012

## 2012-05-22 NOTE — Telephone Encounter (Signed)
Message copied by Littie Deeds on Thu May 22, 2012  9:35 AM ------      Message from: Littie Deeds      Created: Wed May 21, 2012  1:13 PM       12/27 at 11:40.

## 2012-05-22 NOTE — Progress Notes (Signed)
Patient in to clinic today for interim follow-up visit. She reports ongoing hot flashes which she reports as severe and occasionally interfering with ADLs. She continues to take tamoxifen daily, and began dosing on 04/10/2012. She reports ongoing peripheral neuropathy, but notes very slight improvement in her symptoms.  Patient is aware that study follow-up is expected at six months from the end of study therapy (i.e. In February 2014, six months from last paclitaxel on 02/05/2012). She is also aware that mammograms are expected annually, beginning in February 2014, 12 months from her initial baseline mammogram on 07/23/2011. Patient is aware that study follow-up will occur initially every six months beginning in February. Thanked patient for her participation in the study.

## 2012-05-22 NOTE — Telephone Encounter (Signed)
LMOM letting pt know that her first PO will be on 12/27 at 11:40.

## 2012-05-22 NOTE — Progress Notes (Signed)
Hematology and Oncology Follow Up Visit  ISHI DANSER [id number] 1955/11/12 56 y.o. 01/08/12  HPI: Ms. Weight is a 56 year old [place] woman with a history of a T2, N1, nuclear grade 2 invasive lobular carcinoma of the left breast for which she underwent a left lumpectomy with sentinel node dissection on 08/14/2011  revealing a 2.2 cm primary lesion with 2/3 sentinal nodes involved with metastatic disease with extracapsular extension. Tumor was noted to be ER/PR positive at 20/10% respectively, HER-2 negative, with a Ki-67 of 30%. She underwent reexcision of the left axillary lymph nodes on 08/28/2011 which showed 0 of 4 nodes involved. She is participating in NSABP B-49, arm one, having completed 4 cycles of adjuvant dose dense Adriamycin/Cytoxan, due for week 12/12 planned weekly Taxol. Currently on radiation therapy.  Interim History:   . She returns for followup.Radiation was completed 03/26/12. Lt arm is sore with limited ROM. Hot flashes severe , she enrolled in the sheet study and felt better. She is using peridin c , 2 pills , 3x /d. Her hands are better, neuropathy has improved.  . Overall performance status excellent ECoG 0.  Medications:   I have reviewed the patient's current medications.  Allergies:  Allergies  Allergen Reactions  . Adhesive (Tape) Other (See Comments)    BLISTERS  . Codeine Nausea And Vomiting    Physical Exam: There were no vitals filed for this visit.  There is no height or weight on file to calculate BMI. ECOG 0 Weight: 193 lbs. HEENT:  Sclerae anicteric, conjunctivae pink.  Nodes:  No cervical, supraclavicular, or axillary lymphadenopathy palpated.  Lungs:  Clear to auscultation bilaterally.  No crackles, rhonchi, or wheezes.   Heart:  Regular rate and rhythm.   Abdomen:  Soft, nontender.  Positive bowel sounds.  No organomegaly or masses palpated.   Musculoskeletal:  No focal spinal tenderness to palpation.  Extremities:  Benign.   No peripheral edema or cyanosis.   Skin:  Benign.   Neuro:  Nonfocal, alert and oriented x 3, of note strength is intact. BREAST EXAM: In the supine position, with the right arm over the head, the right nipple is everted. No periareolar edema or nipple discharge. No mass in any quadrant or subareolar region. No redness of the skin. No right axillary adenopathy. With the left arm over the head, the left nipple is everted. No periareolar edema or nipple discharge. No mass in any quadrant or subareolar region. At the 4 o'clock position, a 5 cm well healed incision with volume deficit under the scar. No redness of the skin. No left axillary adenopathy.    Lab Results: Lab Results  Component Value Date   WBC 4.7 05/22/2012   HGB 14.6 05/22/2012   HCT 41.2 05/22/2012   MCV 92.2 05/22/2012   PLT 215 05/22/2012   NEUTROABS 3.2 05/22/2012     Chemistry      Component Value Date/Time   NA 139 02/26/2012 1411   NA 137 02/04/2012 0850   K 4.2 02/26/2012 1411   K 3.9 02/04/2012 0850   CL 105 02/26/2012 1411   CL 103 02/04/2012 0850   CO2 24 02/26/2012 1411   CO2 25 02/04/2012 0850   BUN 13.0 02/26/2012 1411   BUN 12 02/04/2012 0850   CREATININE 0.8 02/26/2012 1411   CREATININE 0.77 02/04/2012 0850      Component Value Date/Time   CALCIUM 9.8 02/26/2012 1411   CALCIUM 9.6 02/04/2012 0850   ALKPHOS 56 02/26/2012 1411  ALKPHOS 50 02/04/2012 0850   AST 38* 02/26/2012 1411   AST 32 02/04/2012 0850   ALT 67* 02/26/2012 1411   ALT 51* 02/04/2012 0850   BILITOT 0.50 02/26/2012 1411   BILITOT 0.3 02/04/2012 0850      Assessment:  1. Ms. Nault is a 56 year old [place] woman with a history of a T2, N1 left breast cancer, on chemo with good tolerance. No evidence of recurrence clinically.  2.. History of herpes simplex on Valtrex 500mg  orally a day. No evidence of recurrence.   4. 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 Grade 1 peripheral neuropathy, on vitamin B-complex daily. 6. History of severe hot  flashes Her father is dying of stage 4 lung cancer at [facility].  Plan:  Patient is doing well. She will go to the [facility] pgm in February 2014. She will go to the lymphedema clinic re- ROM of lt arm. We discussed using effexor for hot flash management. She will have a f/u mammogram in 2/14. I will see her in 3 months.    Tanajah Boulter,

## 2012-05-27 NOTE — Progress Notes (Signed)
No labs needed-father at [facility] will get ride there-sit with him-then get ride home

## 2012-05-28 ENCOUNTER — Ambulatory Visit: Payer: 59 | Attending: Oncology | Admitting: Physical Therapy

## 2012-05-28 ENCOUNTER — Encounter (HOSPITAL_BASED_OUTPATIENT_CLINIC_OR_DEPARTMENT_OTHER): Payer: Self-pay | Admitting: *Deleted

## 2012-05-28 DIAGNOSIS — M25519 Pain in unspecified shoulder: Secondary | ICD-10-CM | POA: Insufficient documentation

## 2012-05-28 DIAGNOSIS — M79609 Pain in unspecified limb: Secondary | ICD-10-CM | POA: Insufficient documentation

## 2012-05-28 DIAGNOSIS — I890 Lymphedema, not elsewhere classified: Secondary | ICD-10-CM | POA: Insufficient documentation

## 2012-05-28 DIAGNOSIS — IMO0001 Reserved for inherently not codable concepts without codable children: Secondary | ICD-10-CM | POA: Insufficient documentation

## 2012-05-29 ENCOUNTER — Encounter (HOSPITAL_BASED_OUTPATIENT_CLINIC_OR_DEPARTMENT_OTHER)
Admission: RE | Disposition: A | Discharge status: Home / Self Care | Payer: Self-pay | Source: Ambulatory Visit | Attending: General Surgery

## 2012-05-29 ENCOUNTER — Ambulatory Visit (HOSPITAL_BASED_OUTPATIENT_CLINIC_OR_DEPARTMENT_OTHER): Payer: 59 | Admitting: Certified Registered Nurse Anesthetist

## 2012-05-29 ENCOUNTER — Encounter (HOSPITAL_BASED_OUTPATIENT_CLINIC_OR_DEPARTMENT_OTHER): Payer: Self-pay | Admitting: Certified Registered Nurse Anesthetist

## 2012-05-29 ENCOUNTER — Encounter (HOSPITAL_BASED_OUTPATIENT_CLINIC_OR_DEPARTMENT_OTHER): Payer: Self-pay | Admitting: *Deleted

## 2012-05-29 ENCOUNTER — Ambulatory Visit (HOSPITAL_BASED_OUTPATIENT_CLINIC_OR_DEPARTMENT_OTHER)
Admission: RE | Admit: 2012-05-29 | Discharge: 2012-05-29 | Disposition: A | Discharge status: Home / Self Care | Payer: 59 | Source: Ambulatory Visit | Attending: General Surgery | Admitting: General Surgery

## 2012-05-29 DIAGNOSIS — M1710 Unilateral primary osteoarthritis, unspecified knee: Secondary | ICD-10-CM | POA: Insufficient documentation

## 2012-05-29 DIAGNOSIS — F411 Generalized anxiety disorder: Secondary | ICD-10-CM | POA: Insufficient documentation

## 2012-05-29 DIAGNOSIS — Z8041 Family history of malignant neoplasm of ovary: Secondary | ICD-10-CM | POA: Insufficient documentation

## 2012-05-29 DIAGNOSIS — Z808 Family history of malignant neoplasm of other organs or systems: Secondary | ICD-10-CM | POA: Insufficient documentation

## 2012-05-29 DIAGNOSIS — Z452 Encounter for adjustment and management of vascular access device: Secondary | ICD-10-CM

## 2012-05-29 DIAGNOSIS — Z853 Personal history of malignant neoplasm of breast: Secondary | ICD-10-CM | POA: Insufficient documentation

## 2012-05-29 DIAGNOSIS — I739 Peripheral vascular disease, unspecified: Secondary | ICD-10-CM | POA: Insufficient documentation

## 2012-05-29 DIAGNOSIS — Z801 Family history of malignant neoplasm of trachea, bronchus and lung: Secondary | ICD-10-CM | POA: Insufficient documentation

## 2012-05-29 DIAGNOSIS — Z90710 Acquired absence of both cervix and uterus: Secondary | ICD-10-CM | POA: Insufficient documentation

## 2012-05-29 HISTORY — PX: PORT-A-CATH REMOVAL: SHX5289

## 2012-05-29 SURGERY — REMOVAL PORT-A-CATH
Anesthesia: Monitor Anesthesia Care | Site: Chest | Wound class: Clean

## 2012-05-29 MED ORDER — MIDAZOLAM HCL 5 MG/5ML IJ SOLN
INTRAMUSCULAR | Status: DC | PRN
  Administered 2012-05-29: 1 mg via INTRAVENOUS

## 2012-05-29 MED ORDER — FENTANYL CITRATE 0.05 MG/ML IJ SOLN: 50.0000 ug | Freq: Once | INTRAMUSCULAR | Status: DC

## 2012-05-29 MED ORDER — OXYCODONE HCL 5 MG/5ML PO SOLN: 5.0000 mg | Freq: Once | ORAL | Status: DC | PRN

## 2012-05-29 MED ORDER — BUPIVACAINE HCL (PF) 0.25 % IJ SOLN
INTRAMUSCULAR | Status: DC | PRN
  Administered 2012-05-29: 16 mL

## 2012-05-29 MED ORDER — LIDOCAINE HCL (CARDIAC) 20 MG/ML IV SOLN
INTRAVENOUS | Status: DC | PRN
  Administered 2012-05-29: 30 mg via INTRAVENOUS

## 2012-05-29 MED ORDER — PROMETHAZINE HCL 25 MG/ML IJ SOLN: 6.2500 mg | INTRAMUSCULAR | Status: DC | PRN

## 2012-05-29 MED ORDER — PROPOFOL 10 MG/ML IV EMUL
INTRAVENOUS | Status: DC | PRN
  Administered 2012-05-29: 75 ug/kg/min via INTRAVENOUS

## 2012-05-29 MED ORDER — MIDAZOLAM HCL 2 MG/2ML IJ SOLN: 1.0000 mg | INTRAMUSCULAR | Status: DC | PRN

## 2012-05-29 MED ORDER — LACTATED RINGERS IV SOLN
INTRAVENOUS | Status: DC
  Administered 2012-05-29: 07:00:00 via INTRAVENOUS

## 2012-05-29 MED ORDER — FENTANYL CITRATE 0.05 MG/ML IJ SOLN: 25.0000 ug | INTRAMUSCULAR | Status: DC | PRN

## 2012-05-29 MED ORDER — FENTANYL CITRATE 0.05 MG/ML IJ SOLN
INTRAMUSCULAR | Status: DC | PRN
  Administered 2012-05-29: 50 ug via INTRAVENOUS

## 2012-05-29 MED ORDER — OXYCODONE HCL 5 MG PO TABS: 5.0000 mg | ORAL_TABLET | Freq: Once | ORAL | Status: DC | PRN

## 2012-05-29 SURGICAL SUPPLY — 29 items
BLADE SURG 15 STRL LF DISP TIS (BLADE) ×1 IMPLANT
BLADE SURG 15 STRL SS (BLADE) ×1
CHLORAPREP W/TINT 26ML (MISCELLANEOUS) ×2 IMPLANT
CLOTH BEACON ORANGE TIMEOUT ST (SAFETY) ×2 IMPLANT
COVER MAYO STAND STRL (DRAPES) ×2 IMPLANT
COVER TABLE BACK 60X90 (DRAPES) ×2 IMPLANT
DECANTER SPIKE VIAL GLASS SM (MISCELLANEOUS) ×2 IMPLANT
DERMABOND ADVANCED (GAUZE/BANDAGES/DRESSINGS) ×1
DERMABOND ADVANCED .7 DNX12 (GAUZE/BANDAGES/DRESSINGS) ×1 IMPLANT
DRAPE PED LAPAROTOMY (DRAPES) ×2 IMPLANT
ELECT COATED BLADE 2.86 ST (ELECTRODE) ×2 IMPLANT
ELECT REM PT RETURN 9FT ADLT (ELECTROSURGICAL) ×2
ELECTRODE REM PT RTRN 9FT ADLT (ELECTROSURGICAL) ×1 IMPLANT
GLOVE BIO SURGEON STRL SZ7 (GLOVE) ×2 IMPLANT
GLOVE BIOGEL PI IND STRL 7.0 (GLOVE) ×1 IMPLANT
GLOVE BIOGEL PI INDICATOR 7.0 (GLOVE) ×1
GLOVE ECLIPSE 7.0 STRL STRAW (GLOVE) ×2 IMPLANT
GOWN PREVENTION PLUS XLARGE (GOWN DISPOSABLE) ×2 IMPLANT
NEEDLE HYPO 25X1 1.5 SAFETY (NEEDLE) ×2 IMPLANT
PACK BASIN DAY SURGERY FS (CUSTOM PROCEDURE TRAY) ×2 IMPLANT
PENCIL BUTTON HOLSTER BLD 10FT (ELECTRODE) ×2 IMPLANT
SLEEVE SCD COMPRESS KNEE MED (MISCELLANEOUS) ×2 IMPLANT
SUT MON AB 4-0 PC3 18 (SUTURE) ×2 IMPLANT
SUT VIC AB 3-0 SH 27 (SUTURE) ×1
SUT VIC AB 3-0 SH 27X BRD (SUTURE) ×1 IMPLANT
SYR CONTROL 10ML LL (SYRINGE) ×2 IMPLANT
TOWEL OR 17X24 6PK STRL BLUE (TOWEL DISPOSABLE) ×2 IMPLANT
TOWEL OR NON WOVEN STRL DISP B (DISPOSABLE) ×2 IMPLANT
WATER STERILE IRR 1000ML POUR (IV SOLUTION) ×2 IMPLANT

## 2012-05-29 NOTE — Transfer of Care (Signed)
Immediate Anesthesia Transfer of Care Note  Patient: Emily Knight  Procedure(s) Performed: Procedure(s) (LRB) with comments: REMOVAL PORT-A-CATH (N/A)  Patient Location: PACU  Anesthesia Type:MAC  Level of Consciousness: awake, alert , oriented and patient cooperative  Airway & Oxygen Therapy: Patient Spontanous Breathing and Patient connected to face mask oxygen  Post-op Assessment: Report given to PACU RN and Post -op Vital signs reviewed and stable  Post vital signs: Reviewed and stable  Complications: No apparent anesthesia complications

## 2012-05-29 NOTE — Op Note (Signed)
Preoperative diagnosis: Breast cancer and no longer needs venous access Postoperative diagnosis: Same as above Procedure: Right subclavian port removal Surgeon: Dr. Harden Mo Anesthesia: Local with monitored anesthesia care Specimens: None Drains: None Estimated blood loss: Minimal Complications: None Sponge needle Correct at end of operation Disposition to recovery in stable condition  Indications: This is a 56 year old female who I know for well from her care for breast cancer. She's completed her therapies and no longer needs venous access would like her port removed. We discussed a port removal with the risks and benefits associated with that.  Procedure: After informed consent was obtained the patient was taken to the operating room. She had sequential compression devices on her legs. She was placed under monitored anesthesia care. Her right chest was prepped and draped in the standard sterile surgical fashion. A surgical timeout was performed.  I infiltrated quarter percent Marcaine throughout the area of the port. I made an incision and carried this out down to the port. I then removed the port and the line in their entirety. All stitch material was removed. Hemostasis was observed. I closed this with 3-0 Vicryl, 4-0 Monocryl, and Dermabond. She tolerated this well and was transferred to recovery stable.

## 2012-05-29 NOTE — Anesthesia Preprocedure Evaluation (Signed)
Anesthesia Evaluation  Patient identified by MRN, date of birth, ID band Patient awake    Reviewed: Allergy & Precautions, H&P , NPO status , Patient's Chart, lab work & pertinent test results, reviewed documented beta blocker date and time   Airway Mallampati: II TM Distance: >3 FB Neck ROM: full    Dental   Pulmonary neg pulmonary ROS,  breath sounds clear to auscultation        Cardiovascular + Peripheral Vascular Disease Rhythm:Regular Rate:Normal     Neuro/Psych Anxiety negative neurological ROS  negative psych ROS   GI/Hepatic negative GI ROS, Neg liver ROS,   Endo/Other  negative endocrine ROS  Renal/GU negative Renal ROS  negative genitourinary   Musculoskeletal   Abdominal   Peds  Hematology negative hematology ROS (+)   Anesthesia Other Findings See surgeon's H&P   Reproductive/Obstetrics negative OB ROS                           Anesthesia Physical Anesthesia Plan  ASA: II  Anesthesia Plan: MAC   Post-op Pain Management:    Induction: Intravenous  Airway Management Planned: Simple Face Mask  Additional Equipment:   Intra-op Plan:   Post-operative Plan:   Informed Consent: I have reviewed the patients History and Physical, chart, labs and discussed the procedure including the risks, benefits and alternatives for the proposed anesthesia with the patient or authorized representative who has indicated his/her understanding and acceptance.     Plan Discussed with: CRNA and Surgeon  Anesthesia Plan Comments:         Anesthesia Quick Evaluation

## 2012-05-29 NOTE — Addendum Note (Signed)
Addendum  created 05/29/12 1446 by Jewel Baize Maybel Dambrosio, CRNA   Modules edited:Charges VN

## 2012-05-29 NOTE — H&P (Signed)
Emily Knight is an 56 y.o. female.   Chief Complaint: completed chemotherapy, no longer needs venous access HPI: 55 yof who I know well from a left breast lumpectomy/left axillary node dissection followed by chemotherapy/radiation therapy.  She has completed all her therapy and returns today to have her port removed.  Past Medical History  Diagnosis Date  . Allergy   . Arthritis     left knee  . Stress fracture     left foot/ankle  . Hot flushes, perimenopausal   . Cancer     left breast  . Breast cancer 08/14/11 [facility]    Left breast lumpectomy/left axillary lymph node resection,invasive Grade II Lobular CA in Situ,,left lymph node bx==positive for metastatic lobular ca(2/2)  . Hot flashes   . Anxiety   . History of chemotherapy final 02/05/12    4 cycles Adriamycin/Cytoxan and 12 Weeks of Taxol  . S/P radiation therapy 02/12/12 -03/26/12    Left Breast, Left Axilla and Left Supraclivcular Region  . Use of tamoxifen (Nolvadex) Start - 04/10/12  . 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 Cerebral aneurysm     dx by cerebral angiogram,encased in bone    Past Surgical History  Procedure Date  . Left knee reconstruction     six surgeries (3 orthoscopic) 3 open, ACL  . Tonsillectomy and adenoidectomy 1971  . Partial hysterectomy 1984? 1989?     for fibroids   . Trigger finger release 06/29/2008    release A-1 pulley and exc. cyst left thumb  . Breast lumpectomy 08/14/11    Left breast lumpectomy, left axillary sentinel node biopsy  . Abdominal hysterectomy 1980's    ovaries intact  . Axillary lymph node dissection 08/28/2011    Procedure: AXILLARY LYMPH NODE DISSECTION;  Surgeon: Emelia Loron, MD;  Location: [facility];  Service: General;  Laterality: Left;  Left axillary node dissection, port placement  . Portacath placement 08/28/2011    Procedure: INSERTION PORT-A-CATH;  Surgeon: Emelia Loron, MD;  Location:   SURGERY CENTER;  Service: General;  Laterality: Right;    Family History  Problem Relation Age of Onset  . Anesthesia problems Mother     post-op N/V  . Cancer Father 70    lung - ages 40 and 53; prostate  . Cancer Maternal Aunt 58    ovarian  . Cancer Paternal Aunt     lung  . Cancer Paternal Uncle     bone cancer  . Melanoma Cousin 66    female ,skin   Social History:  reports that she quit smoking about 11 years ago. Her smoking use included Cigarettes. She has a 15 pack-year smoking history. She has never used smokeless tobacco. She reports that she drinks alcohol. She reports that she does not use illicit drugs.  Allergies:  Allergies  Allergen Reactions  . Adhesive (Tape) Other (See Comments)    BLISTERS  . Codeine Nausea And Vomiting    Medications Prior to Admission  Medication Sig Dispense Refill  . B Complex-C (B-COMPLEX WITH VITAMIN C) tablet Take 1 tablet by mouth 2 (two) times daily. Initiated at one tablet daily; increased dose to BID 01/15/2012 per Dr. Donnie Coffin.      . cholecalciferol (VITAMIN D) 1000 UNITS tablet Take 1,000 Units by mouth daily.      Marland Kitchen LORazepam (ATIVAN) 0.5 MG tablet Take 1 tablet (0.5 mg total) by mouth daily as needed for anxiety.  40 tablet  1  . naproxen sodium (ANAPROX) 220 MG tablet Take 220 mg by mouth 2 (two) times daily with a meal.      . tamoxifen (NOLVADEX) 20 MG tablet Take 1 tablet (20 mg total) by mouth daily.  30 tablet  6  . venlafaxine (EFFEXOR) 37.5 MG tablet Take 1 tablet (37.5 mg total) by mouth 2 (two) times daily.  60 tablet  4  . lidocaine-prilocaine (EMLA) cream Apply 1 application topically as needed.        No results found for this or any previous visit (from the past 48 hour(s)). No results found.  Review of Systems  Constitutional: Negative for fever and chills.  Respiratory: Negative for shortness of breath.   Cardiovascular: Negative for chest pain.    Blood pressure 129/87, pulse 78, temperature 98 F (36.7  C), temperature source Oral, resp. rate 18, height 5\' 7"  (1.702 m), weight 187 lb 8 oz (85.049 kg), SpO2 96.00%. Physical Exam  Constitutional: She appears well-developed and well-nourished.  Respiratory:       Assessment/Plan Breast cancer  We discussed port removal today.  Risks/benefits discussed.  [name] 05/29/2012, 7:32 AM

## 2012-05-29 NOTE — Anesthesia Procedure Notes (Signed)
Procedure Name: MAC Performed by: Adarius Tigges D Pre-anesthesia Checklist: Patient identified, Emergency Drugs available, Suction available and Patient being monitored Patient Re-evaluated:Patient Re-evaluated prior to inductionOxygen Delivery Method: Simple face mask     

## 2012-05-29 NOTE — Anesthesia Postprocedure Evaluation (Signed)
  Anesthesia Post-op Note  Patient: Emily Knight  Procedure(s) Performed: Procedure(s) (LRB) with comments: REMOVAL PORT-A-CATH (N/A)  Patient Location: PACU  Anesthesia Type:MAC  Level of Consciousness: awake and alert   Airway and Oxygen Therapy: Patient Spontanous Breathing  Post-op Pain: mild  Post-op Assessment: Post-op Vital signs reviewed, Patient's Cardiovascular Status Stable, Respiratory Function Stable, Patent Airway, No signs of Nausea or vomiting, Adequate PO intake and Pain level controlled  Post-op Vital Signs: stable  Complications: No apparent anesthesia complications

## 2012-05-30 ENCOUNTER — Encounter (HOSPITAL_BASED_OUTPATIENT_CLINIC_OR_DEPARTMENT_OTHER): Payer: Self-pay | Admitting: General Surgery

## 2012-06-02 ENCOUNTER — Ambulatory Visit: Payer: 59

## 2012-06-03 ENCOUNTER — Ambulatory Visit (INDEPENDENT_AMBULATORY_CARE_PROVIDER_SITE_OTHER): Payer: 59 | Admitting: General Surgery

## 2012-06-03 ENCOUNTER — Encounter (INDEPENDENT_AMBULATORY_CARE_PROVIDER_SITE_OTHER): Payer: 59 | Admitting: General Surgery

## 2012-06-04 ENCOUNTER — Ambulatory Visit: Payer: 59

## 2012-06-05 ENCOUNTER — Ambulatory Visit: Payer: 59

## 2012-06-09 ENCOUNTER — Encounter (INDEPENDENT_AMBULATORY_CARE_PROVIDER_SITE_OTHER): Payer: 59 | Admitting: General Surgery

## 2012-06-09 ENCOUNTER — Ambulatory Visit: Payer: 59

## 2012-06-13 ENCOUNTER — Encounter (INDEPENDENT_AMBULATORY_CARE_PROVIDER_SITE_OTHER): Payer: 59 | Admitting: General Surgery

## 2012-06-16 ENCOUNTER — Ambulatory Visit: Payer: 59

## 2012-06-23 ENCOUNTER — Ambulatory Visit (INDEPENDENT_AMBULATORY_CARE_PROVIDER_SITE_OTHER): Payer: 59 | Admitting: General Surgery

## 2012-06-23 ENCOUNTER — Encounter (INDEPENDENT_AMBULATORY_CARE_PROVIDER_SITE_OTHER): Payer: Self-pay | Admitting: General Surgery

## 2012-06-23 VITALS — BP 124/72 | HR 68 | Temp 98.6°F | Resp 18 | Ht 67.0 in | Wt 191.0 lb

## 2012-06-23 DIAGNOSIS — Z09 Encounter for follow-up examination after completed treatment for conditions other than malignant neoplasm: Secondary | ICD-10-CM

## 2012-06-23 NOTE — Progress Notes (Signed)
Subjective:     Patient ID: Emily Knight, female   DOB: Oct 18, 1955, 57 y.o.   MRN: [id number]  HPI  47 yof who has recently been treated for breast cancer and I just removed her port.  She returns today doing well without complaint.  She is having some hot flashes now.    Review of Systems     Objective:   Physical Exam Right chest incision healing well    Assessment:     S/p port removal    Plan:    She is doing well after surgery and I will plan on seeing back in 6 months.

## 2012-07-14 ENCOUNTER — Telehealth (INDEPENDENT_AMBULATORY_CARE_PROVIDER_SITE_OTHER): Payer: Self-pay | Admitting: General Surgery

## 2012-07-14 NOTE — Telephone Encounter (Signed)
Patient calling because she has a mammogram scheduled on 07/23/12 at [facility] and was told she needs an order for it faxed to them from Dr Dwain Sarna. Please place an order for a diagnostic bilateral mammogram and fax to [phone]. Patient has a history of breast cancer.

## 2012-07-15 ENCOUNTER — Telehealth (INDEPENDENT_AMBULATORY_CARE_PROVIDER_SITE_OTHER): Payer: Self-pay

## 2012-07-15 DIAGNOSIS — Z853 Personal history of malignant neoplasm of breast: Secondary | ICD-10-CM

## 2012-07-15 DIAGNOSIS — C50919 Malignant neoplasm of unspecified site of unspecified female breast: Secondary | ICD-10-CM

## 2012-07-15 NOTE — Telephone Encounter (Signed)
Called pt to let her know we faxed mgm order to [facility].

## 2012-07-15 NOTE — Progress Notes (Signed)
[facility] today.  Letter mailed to patient.  

## 2012-07-16 ENCOUNTER — Telehealth: Payer: Self-pay | Admitting: Oncology

## 2012-07-16 NOTE — Telephone Encounter (Signed)
Pt called and expressed that she wanted to have Dr Darnelle Catalan as her new Dr.

## 2012-07-17 NOTE — Progress Notes (Signed)
[facility] today.  Letter mailed to patient.  

## 2012-07-19 ENCOUNTER — Telehealth: Payer: Self-pay | Admitting: *Deleted

## 2012-07-19 ENCOUNTER — Encounter: Payer: Self-pay | Admitting: Oncology

## 2012-07-19 NOTE — Telephone Encounter (Signed)
Misty Stanley called and confirmed 08/21/12 appt w/ pt.  Mailed letter & calendar to pt.

## 2012-07-28 ENCOUNTER — Encounter (INDEPENDENT_AMBULATORY_CARE_PROVIDER_SITE_OTHER): Payer: Self-pay

## 2012-08-07 ENCOUNTER — Other Ambulatory Visit: Payer: Self-pay | Admitting: *Deleted

## 2012-08-07 ENCOUNTER — Telehealth: Payer: Self-pay | Admitting: *Deleted

## 2012-08-07 NOTE — Telephone Encounter (Signed)
Called and spoke with patient to reschedule her appt. Confirmed appt. For 08/21/12 at 3pm with Jacquie Hunter,NP. Then will become Dr.Magrinat.

## 2012-08-19 ENCOUNTER — Telehealth: Payer: Self-pay | Admitting: *Deleted

## 2012-08-19 NOTE — Telephone Encounter (Signed)
Received call back from patient to reschedule appt. Confirmed appt. For 08/26/12 at 0945 with Ernestina Penna.

## 2012-08-21 ENCOUNTER — Other Ambulatory Visit: Payer: 59 | Admitting: Lab

## 2012-08-21 ENCOUNTER — Ambulatory Visit: Payer: 59 | Admitting: Oncology

## 2012-08-21 ENCOUNTER — Ambulatory Visit: Payer: 59 | Admitting: Physician Assistant

## 2012-08-21 ENCOUNTER — Ambulatory Visit: Payer: 59 | Admitting: Family

## 2012-08-26 ENCOUNTER — Other Ambulatory Visit: Payer: Self-pay | Admitting: Oncology

## 2012-08-26 ENCOUNTER — Ambulatory Visit (HOSPITAL_BASED_OUTPATIENT_CLINIC_OR_DEPARTMENT_OTHER): Payer: 59 | Admitting: Nurse Practitioner

## 2012-08-26 ENCOUNTER — Encounter: Payer: Self-pay | Admitting: Nurse Practitioner

## 2012-08-26 ENCOUNTER — Telehealth: Payer: Self-pay | Admitting: Oncology

## 2012-08-26 VITALS — BP 127/78 | HR 56 | Temp 97.9°F | Resp 20 | Ht 67.0 in | Wt 183.8 lb

## 2012-08-26 DIAGNOSIS — C773 Secondary and unspecified malignant neoplasm of axilla and upper limb lymph nodes: Secondary | ICD-10-CM

## 2012-08-26 DIAGNOSIS — R5381 Other malaise: Secondary | ICD-10-CM

## 2012-08-26 DIAGNOSIS — C50419 Malignant neoplasm of upper-outer quadrant of unspecified female breast: Secondary | ICD-10-CM

## 2012-08-26 DIAGNOSIS — C50912 Malignant neoplasm of unspecified site of left female breast: Secondary | ICD-10-CM

## 2012-08-26 NOTE — Telephone Encounter (Signed)
gv pt appt schedule for June.  °

## 2012-08-26 NOTE — Progress Notes (Signed)
[facility] OFFICE PROGRESS NOTE  Mickie Hillier, MD  DIAGNOSIS: Recalled from screening mammogram for abnormality - biopsy on 07/24/2011 showed left invasive lobular carcinoma, grade 2.    PAST THERAPY:  1. LEFT lumpectomy with sentinel node dissection on 08/14/2011 - revealed 2.2cm primary lesion with 2/3 sentinel nodes involved with metastatic disease showing extracapsular extension. ER 20%, PR 10%, HER2 negative, Ki-67 30% 2. Re-excision of LEFT axillary lymph nodes on 23/05/2012  - showed 0 /4 nodes involved. 3. Enrolled on NSABP B-49, arm one 4. Completed 4 cycles of adjuvant dose dense Adriamycin/Cytoxan, followed by 12 cycles of weekly Taxol - completed 02/05/2012 5. Completed Radiation therapy 03/26/2012. 6. Last mammogram 07/23/2012 - (negative) 7.  BRCA NEGATIVE 8.  Began Tamoxifen  October 2013  CURRENT THERAPY: Tamoxifen 20 mg  INTERVAL HISTORY: Emily Knight 57 y.o. BRCA NEGATIVE [place] female returns for 3 month follow up. She has severe fatigue, night sweats, and has hot flashes during the day which affect her quality of life. She received a prescription for effexor 37. 5 mg BID from Dr. Donnie Coffin at her last office visit and she filled the prescription but opted not to take it based on a friend's negative reaction. We talked through possible side effects as well as dose today. We also discussed other options. She has decided to give it a try. She is very active, walking 2 miles a day on the treadmill, lifting weights at the gym, and building furniture (most recently a bookcase) in her spare time. She has also resumed work, 3 days a week for 8 hour shifts at [employer]. She relates she is constantly very fatigued, however she often skips breakfast and eats a small salad for both lunch and supper. We discussed nutrition today, specifically the need for her to eat sufficient calories and adequate protein in order to fuel her busy life. Additionally she had  cancelled her annual physical last September with her primary care physician. She was scheduled to have her thyroid tested at that office visit. She will reschedule this physical with [name] sometime in the next 3 months.   MEDICAL HISTORY: Past Medical History  Diagnosis Date  . Allergy   . Arthritis     left knee  . Stress fracture     left foot/ankle  . Hot flushes, perimenopausal   . Cancer     left breast  . Breast cancer 08/14/11 [facility]    Left breast lumpectomy/left axillary lymph node resection,invasive Grade II Lobular CA in Situ,,left lymph node bx==positive for metastatic lobular ca(2/2)  . Hot flashes   . Anxiety   . History of chemotherapy final 02/05/12    4 cycles Adriamycin/Cytoxan and 12 Weeks of Taxol  . S/P radiation therapy 02/12/12 -03/26/12    Left Breast, Left Axilla and Left Supraclivcular Region  . Use of tamoxifen (Nolvadex) Start - 04/10/12  . 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 Cerebral aneurysm     dx by cerebral angiogram,encased in bone    SURGICAL HISTORY:  Past Surgical History  Procedure Laterality Date  . Left knee reconstruction      six surgeries (3 orthoscopic) 3 open, ACL  . Tonsillectomy and adenoidectomy  1971  . Partial hysterectomy  1984? 1989?     for fibroids   . Trigger finger release  06/29/2008    release A-1 pulley and exc. cyst left thumb  . Breast lumpectomy  08/14/11    Left breast lumpectomy, left axillary sentinel node biopsy  . Abdominal hysterectomy  1980's    ovaries intact  . Axillary lymph node dissection  08/28/2011    Procedure: AXILLARY LYMPH NODE DISSECTION;  Surgeon: Emelia Loron, MD;  Location: [facility];  Service: General;  Laterality: Left;  Left axillary node dissection, port placement  . Portacath placement  08/28/2011    Procedure: INSERTION PORT-A-CATH;  Surgeon: Emelia Loron, MD;  Location: [facility];  Service: General;   Laterality: Right;  . Port-a-cath removal  05/29/2012    Procedure: REMOVAL PORT-A-CATH;  Surgeon: Emelia Loron, MD;  Location: [facility];  Service: General;  Laterality: N/A;    MEDICATIONS: Current Outpatient Prescriptions  Medication Sig Dispense Refill  . B Complex-C (B-COMPLEX WITH VITAMIN C) tablet Take 1 tablet by mouth 2 (two) times daily. Initiated at one tablet daily; increased dose to BID 01/15/2012 per Dr. Donnie Coffin.      . cholecalciferol (VITAMIN D) 1000 UNITS tablet Take 1,000 Units by mouth daily.      Marland Kitchen lidocaine-prilocaine (EMLA) cream Apply 1 application topically as needed.      Marland Kitchen LORazepam (ATIVAN) 0.5 MG tablet Take 1 tablet (0.5 mg total) by mouth daily as needed for anxiety.  40 tablet  1  . naproxen sodium (ANAPROX) 220 MG tablet Take 220 mg by mouth 2 (two) times daily with a meal.      . tamoxifen (NOLVADEX) 20 MG tablet Take 1 tablet (20 mg total) by mouth daily.  30 tablet  6  . venlafaxine (EFFEXOR) 37.5 MG tablet Take 1 tablet (37.5 mg total) by mouth 2 (two) times daily.  60 tablet  4   No current facility-administered medications for this visit.    ALLERGIES:  is allergic to adhesive and codeine.  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 Has hard time feeling substances in hands, and persistent numbness at bottom of feet.   The rest of the 14-point review of system was negative.   Filed Vitals:   08/26/12 0941  BP: 127/78  Pulse: 56  Temp: 97.9 F (36.6 C)  Resp: 20   Wt Readings from  Last 3 Encounters:  08/26/12 183 lb 12.8 oz (83.371 kg)  06/23/12 191 lb (86.637 kg)  05/29/12 187 lb 8 oz (85.049 kg)   ECOG Performance status: 0  PHYSICAL EXAMINATION: 57 yr old Caucasian female who appears her stated age.  General:  well-nourished in no acute distress.   Eyes:  no scleral icterus.   ENT:  There were no oropharyngeal lesions.  Neck was without thyromegaly.  Lymphatics:  Negative cervical, supraclavicular, axillary, or inguinal adenopathy.  Respiratory: lungs were clear bilaterally without wheezing or crackles.  Cardiovascular:  Regular rate and rhythm, S1/S2, without murmur, rub or gallop.  There was no pedal edema.   Breast: no nipple retraction, no nipple discharge, no unusual masses or thickening, negative for palpable abnormality. LEFT breast with healed surgical scar noted, LEFT breast slightly darker in color compared to right.  Axillae negative bilaterally. GI:  abdomen was soft, flat, nontender, nondistended, without organomegaly.  Musculoskeletal:  no spinal tenderness of palpation of vertebral spine.   Skin exam was without echymosis, petichae.   Neuro exam was nonfocal.  Cranial nerves II- XII grossly intact Patient was able to get on and off exam table without assistance.  Gait was normal.  Patient was alerted and oriented.  Attention was good.   Language was appropriate.  Mood was normal without depression.  Speech was not pressured.  Thought content was not tangential.     LABORATORY/RADIOLOGY DATA: No labs drawn today. We will obtain labs at next office visit. Lab Results  Component Value Date   WBC 4.7 05/22/2012   HGB 16.0* 05/29/2012   HCT 41.2 05/22/2012   PLT 215 05/22/2012   GLUCOSE 91 02/26/2012   CHOL  Value: 167        ATP III CLASSIFICATION:  <200     mg/dL   Desirable  409-811  mg/dL   Borderline High  >=914    mg/dL   High        7/82/9562   TRIG 149 08/10/2010   HDL 36* 08/10/2010   LDLCALC  Value: 101        Total Cholesterol/HDL:CHD Risk  Coronary Heart Disease Risk Table                     Men   Women  1/2 Average Risk   3.4   3.3  Average Risk       5.0   4.4  2 X Average Risk   9.6   7.1  3 X Average Risk  23.4   11.0        Use the calculated Patient Ratio above and the CHD Risk Table to determine the patient's CHD Risk.        ATP III CLASSIFICATION (LDL):  <100     mg/dL   Optimal  130-865  mg/dL   Near or Above                    Optimal  130-159  mg/dL   Borderline  784-696  mg/dL   High  >295     mg/dL   Very High* 2/84/1324   ALT 67* 02/26/2012   AST 38* 02/26/2012   NA 139 02/26/2012   K 4.2 02/26/2012   CL 105 02/26/2012   CREATININE 0.8 02/26/2012   BUN 13.0 02/26/2012   CO2 24 02/26/2012   TSH 1.409 08/10/2010        ASSESSMENT AND PLAN: 75 yr [place] woman seen for 3 month follow up of her LEFT invasive lobular ca - initial biopsy  07/24/2011.    1. LEFT lumpectomy with sentinel node dissection on 08/14/2011 - revealed 2.2cm primary lesion with 2/3 sentinel nodes involved with metastatic disease showing extracapsular extension. ER 20%, PR 10%, HER2 negative, Ki-67 30% 2. Re-excision of LEFT axillary lymph nodes on 23/05/2012  - showed 0 /4 nodes involved. 3. Enrolled on NSABP B-49, arm one 4. Completed 4 cycles of adjuvant dose dense Adriamycin/Cytoxan, followed by 12 cycles of weekly Taxol - completed 02/05/2012 5. Completed Radiation therapy 03/26/2012. 6. Last mammogram 07/23/2012 - (negative) 7.  BRCA NEGATIVE 8.  Began Tamoxifen October 2013 - She will continue on Tamoxifen and follow up with Dr. Darnelle Catalan in 3 months.  She did meet him today.  9. Hot flashes - She will try Effexor 37. 5 mg BID  10. Fatigue - she will increase the calories and protein in  her diet and follow up with her PCP for annual physical and testing of her thyroid 11. Exercise- she will continue her active lifestyle.   This patient was discussed with Dr. Darnelle Catalan, who also met with the patient. Total time of the visit was 45 minutes.   Bobbe Medico, AOCNP, NP-C

## 2012-09-24 ENCOUNTER — Encounter: Payer: Self-pay | Admitting: Oncology

## 2012-11-16 ENCOUNTER — Other Ambulatory Visit: Payer: Self-pay | Admitting: Oncology

## 2012-11-16 DIAGNOSIS — C50919 Malignant neoplasm of unspecified site of unspecified female breast: Secondary | ICD-10-CM

## 2012-11-18 MED ORDER — TAMOXIFEN CITRATE 20 MG PO TABS: 20.0000 mg | ORAL_TABLET | Freq: Every day | ORAL | Status: DC

## 2012-11-18 NOTE — Addendum Note (Signed)
Addended by: Augusto Garbe on: 11/18/2012 11:07 AM   Modules accepted: Orders

## 2012-11-18 NOTE — Telephone Encounter (Signed)
Request for a 90 day prescription requested.  Refill sent this time for one, 90 - day refill.

## 2012-11-25 ENCOUNTER — Ambulatory Visit: Payer: 59 | Admitting: Oncology

## 2012-11-25 ENCOUNTER — Other Ambulatory Visit: Payer: 59 | Admitting: Lab

## 2012-12-05 ENCOUNTER — Telehealth: Payer: Self-pay | Admitting: *Deleted

## 2012-12-05 NOTE — Telephone Encounter (Signed)
Pt called to r/s her ftka. gv appt d/t for 12/09/12 labs@ 8:45am, and ov @9 :15am....td

## 2012-12-09 ENCOUNTER — Telehealth: Payer: Self-pay | Admitting: *Deleted

## 2012-12-09 ENCOUNTER — Ambulatory Visit (HOSPITAL_BASED_OUTPATIENT_CLINIC_OR_DEPARTMENT_OTHER): Payer: 59 | Admitting: Physician Assistant

## 2012-12-09 ENCOUNTER — Encounter: Payer: Self-pay | Admitting: Physician Assistant

## 2012-12-09 ENCOUNTER — Other Ambulatory Visit (HOSPITAL_BASED_OUTPATIENT_CLINIC_OR_DEPARTMENT_OTHER): Payer: 59

## 2012-12-09 VITALS — BP 144/83 | HR 63 | Temp 98.2°F | Resp 20 | Ht 67.0 in | Wt 185.7 lb

## 2012-12-09 DIAGNOSIS — C50912 Malignant neoplasm of unspecified site of left female breast: Secondary | ICD-10-CM

## 2012-12-09 DIAGNOSIS — C50419 Malignant neoplasm of upper-outer quadrant of unspecified female breast: Secondary | ICD-10-CM

## 2012-12-09 DIAGNOSIS — R232 Flushing: Secondary | ICD-10-CM

## 2012-12-09 DIAGNOSIS — Z170 Estrogen receptor positive status [ER+]: Secondary | ICD-10-CM

## 2012-12-09 DIAGNOSIS — G5690 Unspecified mononeuropathy of unspecified upper limb: Secondary | ICD-10-CM

## 2012-12-09 DIAGNOSIS — C50412 Malignant neoplasm of upper-outer quadrant of left female breast: Secondary | ICD-10-CM

## 2012-12-09 LAB — COMPREHENSIVE METABOLIC PANEL (CC13)
ALT: 34 U/L (ref 0–55)
AST: 26 U/L (ref 5–34)
Albumin: 3.6 g/dL (ref 3.5–5.0)
Alkaline Phosphatase: 55 U/L (ref 40–150)
BUN: 19.2 mg/dL (ref 7.0–26.0)
CO2: 27 meq/L (ref 22–29)
Calcium: 9.5 mg/dL (ref 8.4–10.4)
Chloride: 104 meq/L (ref 98–107)
Creatinine: 0.8 mg/dL (ref 0.6–1.1)
Glucose: 113 mg/dL — ABNORMAL HIGH (ref 70–99)
Potassium: 4 meq/L (ref 3.5–5.1)
Sodium: 140 meq/L (ref 136–145)
Total Bilirubin: 0.36 mg/dL (ref 0.20–1.20)
Total Protein: 6.5 g/dL (ref 6.4–8.3)

## 2012-12-09 LAB — CBC WITH DIFFERENTIAL/PLATELET
Basophils Absolute: 0 10*3/uL (ref 0.0–0.1)
EOS%: 2.3 % (ref 0.0–7.0)
HCT: 38.1 % (ref 34.8–46.6)
HGB: 13.3 g/dL (ref 11.6–15.9)
LYMPH%: 26.3 % (ref 14.0–49.7)
MCH: 31.7 pg (ref 25.1–34.0)
MCV: 90.7 fL (ref 79.5–101.0)
MONO%: 4 % (ref 0.0–14.0)
NEUT%: 66.8 % (ref 38.4–76.8)
Platelets: 209 10*3/uL (ref 145–400)

## 2012-12-09 NOTE — Telephone Encounter (Signed)
appts made and printed...td 

## 2012-12-09 NOTE — Progress Notes (Signed)
ID: Emily Knight OB: 21-Dec-1955  MR#: [id number]  CSN#:[id number]  PCP: Mickie Hillier, MD GYN:   SU:  Emelia Loron, MD OTHER MD: Lonie Peak, MD   HISTORY OF PRESENT ILLNESS: The patient was recalled for additional evaluateion when a screening mammogram showed an abnormality in the left breast.  A biopsy obtained on 07/24/2011 did confirm left invasive lobular carcinoma, grade 2, ER +20%, PR +10%, HER-2/neu negative, with MIB-1 is 1 of 30%.  A subsequent MRI obtained on 08/01/2011 confirmed a solitary enhancing mass with adjuvant satellite nodularity in the lateral aspect of the left breast, 2.2 x 1.4 x 2.5 cm. There were no enlarged axillary or internal mammary lymph nodes. No abnormal enhancement was seen in the right breast.  Patient subsequently underwent a left lumpectomy with sentinel node dissection under the care of Dr. Dwain Sarna on 08/14/2011 revealing a 2.2 cm primary lesion, 2 of 3 sentinel lymph nodes involved with extracapsular extension. She then underwent reexcision of left axillary lymph nodes on 08/28/2011 showing 0 of 4 additional nodes involved.  Details regarding treatment history is noted below.   INTERVAL HISTORY: Emily Knight returns today for followup of her left breast carcinoma. She participated in the NSABP B-49 protocol. She's currently on tamoxifen, 20 mg daily with good tolerance.   She does have continued hot flashes associated with the tamoxifen. She's currently on venlafaxine 37.5 mg daily. When she was seen here in March, it was recommended that she increase this to twice daily, but she never made to change. The hot flashes occur during the day and at night.  Her energy level is good, and she continues to feel more and more like herself. She is still working 3 days a week, and when she is at home does a lot of woodworking.   REVIEW OF SYSTEMS: Emily Knight has had no recent illnesses and denies any fevers or chills. She's had no skin changes or rashes and denies  any abnormal bruising or bleeding. She's eating and drinking well with no nausea and no change in bowel or bladder habits. She denies any cough, shortness of breath, orthopnea, chest pain, or palpitations, and has had no cardiac events. She also denies any abnormal headaches or dizziness. She's had no unusual myalgias, arthralgias, or bony pain, and denies any peripheral swelling. She has occasional cramping in the legs at night. 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  Otherwise a detailed review of systems is unremarkable.     PAST MEDICAL HISTORY: Past Medical History  Diagnosis Date  . Allergy   . Arthritis     left knee  . Stress fracture     left foot/ankle  . Hot flushes, perimenopausal   . Cancer     left breast  . Breast cancer 08/14/11 [facility]    Left breast lumpectomy/left axillary lymph node resection,invasive Grade II Lobular CA in Situ,,left lymph node bx==positive for metastatic lobular ca(2/2)  . Hot flashes   . Anxiety   . History of chemotherapy final 02/05/12    4 cycles Adriamycin/Cytoxan and 12 Weeks of Taxol  . S/P radiation therapy 02/12/12 -03/26/12    Left Breast, Left Axilla and Left Supraclivcular Region  . Use of tamoxifen (Nolvadex) Start - 04/10/12  . 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 Cerebral aneurysm     dx by cerebral angiogram,encased in bone    PAST SURGICAL HISTORY: Past Surgical  History  Procedure Laterality Date  . Left knee reconstruction      six surgeries (3 orthoscopic) 3 open, ACL  . Tonsillectomy and adenoidectomy  1971  . Partial hysterectomy  1984? 1989?     for fibroids   . Trigger finger release  06/29/2008    release A-1 pulley and exc. cyst left thumb  . Breast lumpectomy  08/14/11    Left breast lumpectomy, left axillary sentinel node biopsy  . Abdominal hysterectomy  1980's    ovaries intact  . Axillary lymph node dissection  08/28/2011     Procedure: AXILLARY LYMPH NODE DISSECTION;  Surgeon: Emelia Loron, MD;  Location: [facility];  Service: General;  Laterality: Left;  Left axillary node dissection, port placement  . Portacath placement  08/28/2011    Procedure: INSERTION PORT-A-CATH;  Surgeon: Emelia Loron, MD;  Location: [facility];  Service: General;  Laterality: Right;  . Port-a-cath removal  05/29/2012    Procedure: REMOVAL PORT-A-CATH;  Surgeon: Emelia Loron, MD;  Location: [facility];  Service: General;  Laterality: N/A;    FAMILY HISTORY Family History  Problem Relation Age of Onset  . Anesthesia problems Mother     post-op N/V  . Cancer Father 63    lung - ages 14 and 29; prostate  . Cancer Maternal Aunt 58    ovarian  . Cancer Paternal Aunt     lung  . Cancer Paternal Uncle     bone cancer  . Melanoma Cousin 43    female ,skin    GYNECOLOGIC HISTORY:   SOCIAL HISTORY:      ADVANCED DIRECTIVES:    HEALTH MAINTENANCE: History  Substance Use Topics  . Smoking status: Former Smoker -- 1.00 packs/day for 15 years    Types: Cigarettes    Quit date: 01/23/2001  . Smokeless tobacco: Never Used     Comment: quit smoking 10 yrs. ago  . Alcohol Use: Yes     Comment: occasionally     Colonoscopy:  PAP:  Bone density:  Lipid panel:  Allergies  Allergen Reactions  . Adhesive (Tape) Other (See Comments)    BLISTERS  . Codeine Nausea And Vomiting    Current Outpatient Prescriptions  Medication Sig Dispense Refill  . B Complex-C (B-COMPLEX WITH VITAMIN C) tablet Take 1 tablet by mouth 2 (two) times daily. Initiated at one tablet daily; increased dose to BID 01/15/2012 per Dr. Donnie Coffin.      . CELEBREX 200 MG capsule       . cholecalciferol (VITAMIN D) 1000 UNITS tablet Take 1,000 Units by mouth daily.      Marland Kitchen LORazepam (ATIVAN) 0.5 MG tablet Take 0.5 mg by mouth every 8 (eight) hours.      . naproxen sodium (ANAPROX) 220 MG tablet Take 220 mg  by mouth 2 (two) times daily with a meal.      . tamoxifen (NOLVADEX) 20 MG tablet Take 1 tablet (20 mg total) by mouth daily.  90 tablet  0  . venlafaxine (EFFEXOR) 37.5 MG tablet Take 1 tablet (37.5 mg total) by mouth 2 (two) times daily.  60 tablet  4  . Vitamin D, Ergocalciferol, (DRISDOL) 50000 UNITS CAPS        No current facility-administered medications for this visit.    OBJECTIVE: Middle-aged white female who appears comfortable and is in no acute distress. Filed Vitals:   12/09/12 0907  BP: 144/83  Pulse: 63  Temp: 98.2 F (36.8  C)  Resp: 20     Body mass index is 29.08 kg/(m^2).    ECOG FS: 0 Filed Weights   12/09/12 0907  Weight: 185 lb 11.2 oz (84.233 kg)   Sclerae unicteric Oropharynx clear No cervical or supraclavicular adenopathy Lungs clear to auscultation bilaterally, no wheezes, no rales or rhonchi Heart regular rate and rhythm, no murmurs Abdomen soft, nontender to palpation, positive bowel sounds. No organomegaly palpated. MSK no focal spinal tenderness, no peripheral edema Neuro: non-focal, well-oriented, appropriate affect Breasts: Right breast is unremarkable. Left breast is status post lumpectomy with no suspicious nodularities or skin changes, and no evidence of local recurrence. Axillae are benign bilaterally with no palpable adenopathy noted.    LAB RESULTS:  CMP     Component Value Date/Time   NA 140 12/09/2012 0837   NA 137 02/04/2012 0850   K 4.0 12/09/2012 0837   K 3.9 02/04/2012 0850   CL 104 12/09/2012 0837   CL 103 02/04/2012 0850   CO2 27 12/09/2012 0837   CO2 25 02/04/2012 0850   GLUCOSE 113* 12/09/2012 0837   GLUCOSE 114* 02/04/2012 0850   BUN 19.2 12/09/2012 0837   BUN 12 02/04/2012 0850   CREATININE 0.8 12/09/2012 0837   CREATININE 0.77 02/04/2012 0850   CALCIUM 9.5 12/09/2012 0837   CALCIUM 9.6 02/04/2012 0850   PROT 6.5 12/09/2012 0837   PROT 6.5 02/04/2012 0850   ALBUMIN 3.6 12/09/2012 0837   ALBUMIN 3.8 02/04/2012 0850   AST 26  12/09/2012 0837   AST 32 02/04/2012 0850   ALT 34 12/09/2012 0837   ALT 51* 02/04/2012 0850   ALKPHOS 55 12/09/2012 0837   ALKPHOS 50 02/04/2012 0850   BILITOT 0.36 12/09/2012 0837   BILITOT 0.3 02/04/2012 0850   GFRNONAA >90 08/09/2011 1600   GFRAA >90 08/09/2011 1600    I No results found for this basename: SPEP, UPEP,  kappa and lambda light chains    Lab Results  Component Value Date   WBC 5.3 12/09/2012   NEUTROABS 3.5 12/09/2012   HGB 13.3 12/09/2012   HCT 38.1 12/09/2012   MCV 90.7 12/09/2012   PLT 209 12/09/2012      Chemistry      Component Value Date/Time   NA 140 12/09/2012 0837   NA 137 02/04/2012 0850   K 4.0 12/09/2012 0837   K 3.9 02/04/2012 0850   CL 104 12/09/2012 0837   CL 103 02/04/2012 0850   CO2 27 12/09/2012 0837   CO2 25 02/04/2012 0850   BUN 19.2 12/09/2012 0837   BUN 12 02/04/2012 0850   CREATININE 0.8 12/09/2012 0837   CREATININE 0.77 02/04/2012 0850      Component Value Date/Time   CALCIUM 9.5 12/09/2012 0837   CALCIUM 9.6 02/04/2012 0850   ALKPHOS 55 12/09/2012 0837   ALKPHOS 50 02/04/2012 0850   AST 26 12/09/2012 0837   AST 32 02/04/2012 0850   ALT 34 12/09/2012 0837   ALT 51* 02/04/2012 0850   BILITOT 0.36 12/09/2012 0837   BILITOT 0.3 02/04/2012 0850      STUDIES: Most recent bilateral mammogram at [facility] on 07/23/2012 was unremarkable.    ASSESSMENT: 57 y.o. BRCA negative [place] woman  (1)  Status post LEFT lumpectomy with sentinel node dissection on 08/14/2011 - revealed 2.2cm primary lesion with 2/3 sentinel nodes involved with metastatic disease showing extracapsular extension. ER 20%, PR 10%, HER2 negative, Ki-67 30.  We excision of left axillary lymph nodes on 08/28/2011 showed  0 of 4 nodes involved.  (2)  Enrolled in the NSABP B-49  protocol, or 1. Completed 4 cycles of adjuvant dose dense doxorubicin/cyclophosphamide , followed by 12 weekly cycles of paclitaxel, completed 02/05/2012.   (3) received radiation therapy, completed 03/26/2012   (4)  began tamoxifen, 20 mg daily, October 2013.   (5)  hot flashes associated with tamoxifen, currently on Effexor 37.5 mg daily.       PLAN: Emily Knight will continue on tamoxifen as before, 20 mg daily. We're going to increase her venlafaxine to 37.5 mg twice daily. (This was suggested in the past, but she never made the change.) Hopefully this will help with her hot flashes.  Emily Knight will return to see [place] in 6 months for routine labs and physical exam. She'll see Dr. Darnelle Catalan at that time, but knows to call prior that visit with any changes or problems. Her next bilateral mammogram will be due in February at [place]. She voices understanding and agreement with our plan.  Emily Heinle, PA-C   12/09/2012 10:57 AM

## 2012-12-30 ENCOUNTER — Ambulatory Visit (INDEPENDENT_AMBULATORY_CARE_PROVIDER_SITE_OTHER): Payer: 59 | Admitting: General Surgery

## 2013-01-07 ENCOUNTER — Telehealth: Payer: Self-pay | Admitting: *Deleted

## 2013-01-07 NOTE — Telephone Encounter (Signed)
Rec'd message from patient that she has had some recent left arm swelling and called the [facility]hedema clinic to make appt. She was instructed to call our office for further assessment to rule out other contributing factors. Called patient back on home and cell numbers, left message to call me back with more information of symptoms before we can make referral. Patient last seen in office 12/09/12.

## 2013-01-12 ENCOUNTER — Telehealth (INDEPENDENT_AMBULATORY_CARE_PROVIDER_SITE_OTHER): Payer: Self-pay | Admitting: General Surgery

## 2013-01-12 ENCOUNTER — Other Ambulatory Visit: Payer: Self-pay | Admitting: *Deleted

## 2013-01-12 ENCOUNTER — Telehealth: Payer: Self-pay | Admitting: *Deleted

## 2013-01-12 ENCOUNTER — Ambulatory Visit (HOSPITAL_COMMUNITY)
Admission: RE | Admit: 2013-01-12 | Discharge: 2013-01-12 | Disposition: A | Discharge status: Home / Self Care | Payer: 59 | Source: Ambulatory Visit | Attending: Oncology | Admitting: Oncology

## 2013-01-12 DIAGNOSIS — M79609 Pain in unspecified limb: Secondary | ICD-10-CM

## 2013-01-12 DIAGNOSIS — C50412 Malignant neoplasm of upper-outer quadrant of left female breast: Secondary | ICD-10-CM

## 2013-01-12 DIAGNOSIS — Z79899 Other long term (current) drug therapy: Secondary | ICD-10-CM | POA: Insufficient documentation

## 2013-01-12 DIAGNOSIS — I890 Lymphedema, not elsewhere classified: Secondary | ICD-10-CM | POA: Insufficient documentation

## 2013-01-12 DIAGNOSIS — C50919 Malignant neoplasm of unspecified site of unspecified female breast: Secondary | ICD-10-CM | POA: Insufficient documentation

## 2013-01-12 NOTE — Telephone Encounter (Signed)
Called pt to notify her that I could work the pt in tomorrow to see Dr Dwain Sarna at 1:30. The pt will be here.

## 2013-01-12 NOTE — Telephone Encounter (Signed)
Pt called to this RN to state ongoing swelling and now tenderness in left arm.  This RN was able to arrange doppler for pt for assessment for DVT- she will see this RN post exam for review and other recommendations.

## 2013-01-12 NOTE — Telephone Encounter (Signed)
Patient called status post left axillary dissection 08/28/11 complaining of issues with left arm pain. She was evaluated by Dr Darnelle Catalan for this and sent for a doppler today which she states is negative. She is having pain from her elbow to her hand with no swelling. She is being referred to the lymphedema clinic by Dr Darnelle Catalan. She is asking to be seen prior to her appt scheduled on 02/02/2013 with Dr Dwain Sarna. Please advise. [phone].

## 2013-01-12 NOTE — Progress Notes (Signed)
VASCULAR LAB PRELIMINARY  PRELIMINARY  PRELIMINARY  PRELIMINARY  Left upper extremity venous duplex completed.    Preliminary report:  Left:  No evidence of DVT or superficial thrombosis.    Chalene Treu, RVT 01/12/2013, 12:22 PM

## 2013-01-13 ENCOUNTER — Ambulatory Visit (INDEPENDENT_AMBULATORY_CARE_PROVIDER_SITE_OTHER): Payer: 59 | Admitting: General Surgery

## 2013-01-13 ENCOUNTER — Encounter (INDEPENDENT_AMBULATORY_CARE_PROVIDER_SITE_OTHER): Payer: Self-pay | Admitting: General Surgery

## 2013-01-13 ENCOUNTER — Telehealth (INDEPENDENT_AMBULATORY_CARE_PROVIDER_SITE_OTHER): Payer: Self-pay

## 2013-01-13 VITALS — BP 136/78 | HR 84 | Resp 18 | Ht 67.0 in | Wt 184.0 lb

## 2013-01-13 DIAGNOSIS — I890 Lymphedema, not elsewhere classified: Secondary | ICD-10-CM

## 2013-01-13 NOTE — Telephone Encounter (Signed)
Called to refer pt to Dr Merlyn Lot for lt hand and wrist swelling with pain. The pt had a trigger finger operation in 2010 by Dr Merlyn Lot so we would like for her to be evaluated ASAP. Appt made for 01/15/13 at 8:30. The pt is aware of the appt info.

## 2013-01-13 NOTE — Progress Notes (Signed)
Subjective:     Patient ID: Emily Knight, female   DOB: 03/28/1956, 57 y.o.   MRN: [id number]  HPI 22 yof s/p left lumpectomy and alnd for stage II left breast cancer followed by chemo/xrt.  She hit her left thumb about a month ago with a screwdriver.  Recently she has noted some pain extending from her dorsal aspect of her left wrist down her forearm associated with minimal swelling.  She does wear a sleeve for lymphedema prevention.  She was seen by med onc and was referred for evaluation for possible lymphedema.    Review of Systems     Objective:   Physical Exam Left upper extremity with bruising on thenar eminence near site of healing abrasion, there is some tenderness at the left wrist and base of left thumb extending on the dorsal aspect of left forearm, there also looks to be faint bruising    Assessment:     History left breast cancer ? Trauma left thumb/wrist    Plan:     I don't think this is lymphedema.  It really appears to extend from the thumb where she has an injury.  She can continue sleeve but I will refer her to see Dr Cindee Salt who she has seen in the past for evaluation

## 2013-02-02 ENCOUNTER — Ambulatory Visit (INDEPENDENT_AMBULATORY_CARE_PROVIDER_SITE_OTHER): Payer: 59 | Admitting: General Surgery

## 2013-02-02 ENCOUNTER — Encounter (INDEPENDENT_AMBULATORY_CARE_PROVIDER_SITE_OTHER): Payer: Self-pay | Admitting: General Surgery

## 2013-02-02 VITALS — BP 116/66 | HR 72 | Resp 16 | Ht 67.0 in | Wt 186.2 lb

## 2013-02-02 DIAGNOSIS — C50412 Malignant neoplasm of upper-outer quadrant of left female breast: Secondary | ICD-10-CM

## 2013-02-02 DIAGNOSIS — C50419 Malignant neoplasm of upper-outer quadrant of unspecified female breast: Secondary | ICD-10-CM

## 2013-02-02 NOTE — Patient Instructions (Signed)
Breast Self-Examination You should begin examining your breasts at age 57 even though the risk for breast cancer is low in this age group. It is important to become familiar with how your breasts look and feel. This is true for pregnant women, nursing mothers, women in menopause and women who have breast implants.  Women should examine their breasts once a month to look for changes and lumps. By doing monthly breast exams, you get to know how your breasts feel and how they can change from month to month. This allows you to pick up changes early. It can also offer you some reassurance that your breast health is good. This exam only takes minutes. Most breast lumps are not caused by cancer. If you find a lump, a special x-ray called a mammogram, or other tests may be needed to determine what is wrong.  Some of the signs that a breast lump is caused by cancer include:  Dimpling of the skin or changes in the shape of the breast or nipple.   A dark-colored or bloody discharge from the nipple.   Swollen lymph glands around the breast or in the armpit.   Redness of the breast or nipple.   Scaly nipple or skin on the breast.   Pain or swelling of the breast.  SELF-EXAM There are a few points to follow when doing a thorough breast exam. The best time to examine your breasts is 5 to 7 days after the menstrual period is over. During menstruation, the breasts are lumpier, and it may be more difficult to pick up changes. If you do not menstruate, have reached menopause or had a hysterectomy, examine your breasts the first day of every month. After three to four months, you will become more familiar with the variations of your breasts and more comfortable with the exam.  Perform your breast exam monthly. Keep a written record with breast changes or normal findings for each breast. This makes it easier to be sure of changes and to not solely depend on memory for size, tenderness, or location. Try to do the exam  at the same time each month, and write down where you are in your menstrual cycle if you are still menstruating.   Look at your breasts. Stand in front of a mirror with your hands clasped behind your head. Tighten your chest muscles and look for asymmetry. This means a difference in shape or contour from one breast to the other, such as puckers, dips or bumps. Look also for skin changes.   Lean forward with your hands on your hips. Again, look for symmetry and skin changes.   While showering, soap the breasts, and carefully feel the breasts with fingertips while holding the arm (on the side of the breast being examined) over the head. Do this with each breast carefully feeling for lumps or changes. Typically, a circular motion with moderate fingertip pressure should be used.   Repeat this exam while lying on your back, again with your arm behind your head and a pillow under your shoulders. Again, use your fingertips to examine both breasts, feeling for lumps and thickening. Begin at 1 o'clock and go clockwise around the whole breast.   At the end of your exam, gently squeeze each nipple to see if there is any drainage. Look for nipple changes, dimpling or redness.   Lastly, examine the upper chest and clavicle areas and in your armpits.  It is not necessary to become alarmed if you find   a breast lump. Most of them are not cancerous. However, it is necessary to see your caregiver if a lump is found in order to have it looked at. Document Released: 07/12/2004 Document Revised: 02/14/2011 Document Reviewed: 09/21/2008 Exit[employer] Patient Information 2012 [employer]. 

## 2013-02-03 NOTE — Progress Notes (Signed)
Subjective:     Patient ID: Emily Knight, female   DOB: [birth year], 57 y.o.   MRN: [id number]  HPI 16 yof I saw a few weeks ago with new swelling in the dorsal aspect of left forearm, no hand swelling.  She had screwdriver injury and I thought this might be related.  She has seen Dr Merlyn Lot who think she has some osteoarthritis but otherwise nothing eventful.  She returns today and still has swelling and pain.  This is better when wearing her lymphedema sleeve.  Her hand gets swollen with this and she currently is awaiting a glove.  She is otherwise active and well. She has birads 2 mm in February. Her treatment history includes left lumpectomy/alnd for invasive lobular cancer followed by chemotherapy/xrt and is now on tamoxifen.  Her port has been removed.  Review of Systems     Objective:   Physical Exam Left breast and axilla with healed scars and no mass Left forearm with some mild edema today on dorsal aspect    Assessment:     Lymphedema lue     Plan:     We discussed sleeve and glove and that I think this is likely lymphedema given history now.  She will be seen lymphedema clinic September 3rd.  She otherwise is well will continue self exams, see Dr Darnelle Catalan as scheduled and I can see 6 months after that.  Will get mm in February 2015.

## 2013-02-17 ENCOUNTER — Other Ambulatory Visit: Payer: Self-pay | Admitting: Oncology

## 2013-02-17 ENCOUNTER — Telehealth: Payer: Self-pay | Admitting: *Deleted

## 2013-02-17 NOTE — Telephone Encounter (Signed)
02/17/2013 Received voice mail message from patient this morning stating that she had been seen in Urgent Care over the weekend and was being treated for cellulitis in her affected breast. Patient in follow-up for NSABP B-49 study. Message forwarded to Dr. Darrall Dears nurse for further follow-up. Cindy S. Clelia Croft BSN, RN, [facility] 02/17/2013 3:46 PM

## 2013-02-18 NOTE — Telephone Encounter (Signed)
Late entry for 02/17/2013 Spoke with patient by phone on 02/17/2013 to let her know that her message had been forwarded to Dr. Darrall Dears nurse for follow-up. Patient states that her breast is much improved on antibiotics. Patient is aware that she should contact Dr. Darnelle Catalan or Dr. Dwain Sarna for any persistent or recurrent problems with the breast. Lesly Rubenstein. Clelia Croft BSN, RN, CCRP 02/18/2013 1:43 PM

## 2013-05-02 ENCOUNTER — Other Ambulatory Visit: Payer: Self-pay | Admitting: Oncology

## 2013-05-02 DIAGNOSIS — R232 Flushing: Secondary | ICD-10-CM

## 2013-05-05 ENCOUNTER — Other Ambulatory Visit: Payer: Self-pay | Admitting: Oncology

## 2013-05-05 DIAGNOSIS — R232 Flushing: Secondary | ICD-10-CM

## 2013-05-26 ENCOUNTER — Other Ambulatory Visit (HOSPITAL_BASED_OUTPATIENT_CLINIC_OR_DEPARTMENT_OTHER): Payer: 59 | Admitting: Lab

## 2013-05-26 DIAGNOSIS — C50412 Malignant neoplasm of upper-outer quadrant of left female breast: Secondary | ICD-10-CM

## 2013-05-26 DIAGNOSIS — C50419 Malignant neoplasm of upper-outer quadrant of unspecified female breast: Secondary | ICD-10-CM

## 2013-05-26 LAB — CBC WITH DIFFERENTIAL/PLATELET
Basophils Absolute: 0 10*3/uL (ref 0.0–0.1)
EOS%: 1.8 % (ref 0.0–7.0)
HGB: 14.3 g/dL (ref 11.6–15.9)
LYMPH%: 21.4 % (ref 14.0–49.7)
MCH: 32.5 pg (ref 25.1–34.0)
MCV: 93.3 fL (ref 79.5–101.0)
MONO%: 5.6 % (ref 0.0–14.0)
NEUT%: 70.5 % (ref 38.4–76.8)
Platelets: 224 10*3/uL (ref 145–400)
RDW: 12.4 % (ref 11.2–14.5)

## 2013-05-26 LAB — COMPREHENSIVE METABOLIC PANEL (CC13)
AST: 34 U/L (ref 5–34)
Alkaline Phosphatase: 64 U/L (ref 40–150)
BUN: 23.5 mg/dL (ref 7.0–26.0)
Creatinine: 0.8 mg/dL (ref 0.6–1.1)
Glucose: 114 mg/dl (ref 70–140)
Potassium: 4.1 mEq/L (ref 3.5–5.1)
Total Bilirubin: 0.36 mg/dL (ref 0.20–1.20)

## 2013-06-02 ENCOUNTER — Ambulatory Visit (HOSPITAL_BASED_OUTPATIENT_CLINIC_OR_DEPARTMENT_OTHER): Payer: 59 | Admitting: Oncology

## 2013-06-02 VITALS — BP 151/90 | HR 72 | Temp 98.8°F | Resp 18 | Ht 67.0 in | Wt 190.4 lb

## 2013-06-02 DIAGNOSIS — C50419 Malignant neoplasm of upper-outer quadrant of unspecified female breast: Secondary | ICD-10-CM

## 2013-06-02 DIAGNOSIS — C50412 Malignant neoplasm of upper-outer quadrant of left female breast: Secondary | ICD-10-CM

## 2013-06-02 MED ORDER — CLONIDINE HCL 0.1 MG/24HR TD PTWK: 0.1000 mg | MEDICATED_PATCH | TRANSDERMAL | Status: DC

## 2013-06-02 NOTE — Progress Notes (Signed)
ID: [name] OB: [birth year]  MR#: [id number]  CSN#:[id number]  PCP: Mickie Hillier, MD GYN:   SU:  Emelia Loron, MD OTHER MD: Lonie Peak, MD   HISTORY OF PRESENT ILLNESS: The patient was recalled for additional evaluateion when a screening mammogram showed an abnormality in the left breast.  A biopsy obtained on 07/24/2011 did confirm left invasive lobular carcinoma, grade 2, ER +20%, PR +10%, HER-2/neu negative, with MIB-1 is 1 of 30%.  A subsequent MRI obtained on 08/01/2011 confirmed a solitary enhancing mass with adjuvant satellite nodularity in the lateral aspect of the left breast, 2.2 x 1.4 x 2.5 cm. There were no enlarged axillary or internal mammary lymph nodes. No abnormal enhancement was seen in the right breast.  Patient subsequently underwent a left lumpectomy with sentinel node dissection under the care of Dr. Dwain Sarna on 08/14/2011 revealing a 2.2 cm primary lesion, 2 of 3 sentinel lymph nodes involved with extracapsular extension. She then underwent reexcision of left axillary lymph nodes on 08/28/2011 showing 0 of 4 additional nodes involved.  Details regarding treatment history is noted below.   INTERVAL HISTORY: Yissel returns today for followup of her left breast carcinoma. The interval history is generally unremarkable except that she and her partner are getting married tomorrow. They're leaving for the beach today. She is tolerating tamoxifen well aside from the problems with hot flashes.   REVIEW OF SYSTEMS: Naela is doing well overall except for hot flashes. She started on the venlafaxine and that helped a little. When we tried to increase the dose it made her feel terrible, so she went back to his 37.5 mg. That is really "not doing it". She describes her hot flashes as extreme. They keep her from sleeping and cause her to sweat on [place] at all times during the day.. In addition to that she has some arthritis symptoms, which are not more frequent or  intense than before. She has rare headaches, no visual changes, no nausea or vomiting, no dizziness or gait imbalance. She has some weight night leg cramps. A detailed review of systems today was otherwise negative.    PAST MEDICAL HISTORY: Past Medical History  Diagnosis Date  . Allergy   . Arthritis     left knee  . Stress fracture     left foot/ankle  . Hot flushes, perimenopausal   . Cancer     left breast  . Breast cancer 08/14/11 [facility]    Left breast lumpectomy/left axillary lymph node resection,invasive Grade II Lobular CA in Situ,,left lymph node bx==positive for metastatic lobular ca(2/2)  . Hot flashes   . Anxiety   . History of chemotherapy final 02/05/12    4 cycles Adriamycin/Cytoxan and 12 Weeks of Taxol  . S/P radiation therapy 02/12/12 -03/26/12    Left Breast, Left Axilla and Left Supraclivcular Region  . Use of tamoxifen (Nolvadex) Start - 04/10/12  . 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 Cerebral aneurysm     dx by cerebral angiogram,encased in bone    PAST SURGICAL HISTORY: Past Surgical History  Procedure Laterality Date  . Left knee reconstruction      six surgeries (3 orthoscopic) 3 open, ACL  . Tonsillectomy and adenoidectomy  1971  . Partial hysterectomy  1984? 1989?     for fibroids   . Trigger finger release  06/29/2008    release A-1 pulley and exc. cyst left thumb  . Breast lumpectomy  08/14/11    Left breast lumpectomy, left axillary sentinel node biopsy  . Abdominal hysterectomy  1980's    ovaries intact  . Axillary lymph node dissection  08/28/2011    Procedure: AXILLARY LYMPH NODE DISSECTION;  Surgeon: Emelia Loron, MD;  Location: [facility];  Service: General;  Laterality: Left;  Left axillary node dissection, port placement  . Portacath placement  08/28/2011    Procedure: INSERTION PORT-A-CATH;  Surgeon: Emelia Loron, MD;  Location: [facility];  Service: General;   Laterality: Right;  . Port-a-cath removal  05/29/2012    Procedure: REMOVAL PORT-A-CATH;  Surgeon: Emelia Loron, MD;  Location:  SURGERY CENTER;  Service: General;  Laterality: N/A;    FAMILY HISTORY Family History  Problem Relation Age of Onset  . Anesthesia problems Mother     post-op N/V  . Cancer Father 59    lung - ages 24 and 80; prostate  . Cancer Maternal Aunt 58    ovarian  . Cancer Paternal Aunt     lung  . Cancer Paternal Uncle     bone cancer  . Melanoma Cousin 41    female ,skin   the patient's father died at the age of 53 from metastatic lung cancer. He had a remote history of smoking. The patient's mother is living at age 15. The patient has 2 brothers, no sisters. There is no other history of breast cancer in the family. She did have a maternal aunt with ovarian cancer. The patient has been tested for the BRCA gene and is negative. In  GYNECOLOGIC HISTORY:  Menarche age 44. She is GX P0. She had a simple hysterectomy, no salpingo-oophorectomy at age 24. She never took hormone replacement.  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 She is now retired from that and works part-time at the [employer]. At home she lives with Dyke Brackett. They are getting married tomorrow. They also have 2 dogs.    ADVANCED DIRECTIVES: In place. Eunice Blase is Amamda's health care power of attorney   HEALTH MAINTENANCE: History  Substance Use Topics  . Smoking status: Former Smoker -- 1.00 packs/day for 15 years    Types: Cigarettes    Quit date: 01/23/2001  . Smokeless tobacco: Never Used     Comment: quit smoking 10 yrs. ago  . Alcohol Use: Yes     Comment: occasionally     Colonoscopy:  PAP:  Bone density:  Lipid panel:  Allergies  Allergen Reactions  . Adhesive [Tape] Other (See Comments)    BLISTERS  . Codeine Nausea And Vomiting    Current Outpatient Prescriptions   Medication Sig Dispense Refill  . B Complex-C (B-COMPLEX WITH VITAMIN C) tablet Take 1 tablet by mouth 2 (two) times daily. Initiated at one tablet daily; increased dose to BID 01/15/2012 per Dr. Donnie Coffin.      . CELEBREX 200 MG capsule       . cholecalciferol (VITAMIN D) 1000 UNITS tablet Take 1,000 Units by mouth daily.      Marland Kitchen LORazepam (ATIVAN) 0.5 MG tablet Take 0.5 mg by mouth every 8 (eight) hours.      . naproxen sodium (ANAPROX) 220 MG tablet Take 220 mg by mouth 2 (two) times daily with a meal.      . tamoxifen (NOLVADEX) 20 MG tablet Take 1 tablet (20 mg total) by mouth daily.  90 tablet  0  .  venlafaxine (EFFEXOR) 37.5 MG tablet TAKE 1 TABLET BY MOUTH TWICE DAILY  180 tablet  1  . Vitamin D, Ergocalciferol, (DRISDOL) 50000 UNITS CAPS        No current facility-administered medications for this visit.    OBJECTIVE: Middle-aged white female who appears well  Filed Vitals:   06/02/13 0851  BP: 151/90  Pulse: 72  Temp: 98.8 F (37.1 C)  Resp: 18     Body mass index is 29.81 kg/(m^2).    ECOG FS: 0 Filed Weights   06/02/13 0851  Weight: 190 lb 6.4 oz (86.365 kg)   Sclerae unicteric, pupils equal and round Oropharynx no thrush or other lesions No cervical or supraclavicular adenopathy Lungs clear to auscultation bilaterally, no wheezes, no rales or rhonchi Heart regular rate and rhythm, no murmurs Abdomen soft, nontender, positive bowel sounds. No organomegaly palpated. MSK no focal spinal tenderness, no upper extremity lymphedema Neuro: non-focal, well-oriented, appropriate affect Breasts: Right breast is unremarkable. Left breast is status post lumpectomy with no evidence of local recurrence. Axillae are benign bilaterally   LAB RESULTS:  CMP     Component Value Date/Time   NA 140 05/26/2013 0908   NA 137 02/04/2012 0850   K 4.1 05/26/2013 0908   K 3.9 02/04/2012 0850   CL 104 12/09/2012 0837   CL 103 02/04/2012 0850   CO2 22 05/26/2013 0908   CO2 25 02/04/2012 0850    GLUCOSE 114 05/26/2013 0908   GLUCOSE 113* 12/09/2012 0837   GLUCOSE 114* 02/04/2012 0850   BUN 23.5 05/26/2013 0908   BUN 12 02/04/2012 0850   CREATININE 0.8 05/26/2013 0908   CREATININE 0.77 02/04/2012 0850   CALCIUM 9.8 05/26/2013 0908   CALCIUM 9.6 02/04/2012 0850   PROT 7.2 05/26/2013 0908   PROT 6.5 02/04/2012 0850   ALBUMIN 4.2 05/26/2013 0908   ALBUMIN 3.8 02/04/2012 0850   AST 34 05/26/2013 0908   AST 32 02/04/2012 0850   ALT 49 05/26/2013 0908   ALT 51* 02/04/2012 0850   ALKPHOS 64 05/26/2013 0908   ALKPHOS 50 02/04/2012 0850   BILITOT 0.36 05/26/2013 0908   BILITOT 0.3 02/04/2012 0850   GFRNONAA >90 08/09/2011 1600   GFRAA >90 08/09/2011 1600    I No results found for this basename: SPEP,  UPEP,   kappa and lambda light chains    Lab Results  Component Value Date   WBC 5.5 05/26/2013   NEUTROABS 3.9 05/26/2013   HGB 14.3 05/26/2013   HCT 41.1 05/26/2013   MCV 93.3 05/26/2013   PLT 224 05/26/2013      Chemistry      Component Value Date/Time   NA 140 05/26/2013 0908   NA 137 02/04/2012 0850   K 4.1 05/26/2013 0908   K 3.9 02/04/2012 0850   CL 104 12/09/2012 0837   CL 103 02/04/2012 0850   CO2 22 05/26/2013 0908   CO2 25 02/04/2012 0850   BUN 23.5 05/26/2013 0908   BUN 12 02/04/2012 0850   CREATININE 0.8 05/26/2013 0908   CREATININE 0.77 02/04/2012 0850      Component Value Date/Time   CALCIUM 9.8 05/26/2013 0908   CALCIUM 9.6 02/04/2012 0850   ALKPHOS 64 05/26/2013 0908   ALKPHOS 50 02/04/2012 0850   AST 34 05/26/2013 0908   AST 32 02/04/2012 0850   ALT 49 05/26/2013 0908   ALT 51* 02/04/2012 0850   BILITOT 0.36 05/26/2013 0908   BILITOT 0.3 02/04/2012 0850  STUDIES: Most recent bilateral mammogram at [facility] on 07/23/2012 was unremarkable. Bone density at [facility] 02/04/2012 showed mild osteopenia with a T score of -1.4  ASSESSMENT: 57 y.o. BRCA negative [place] woman  (1)  Status post LEFT lumpectomy with sentinel node dissection on 08/14/2011 for a pT2 pN1, stage IIB invasive  lobular breast cancer,  ER 20%, PR 10%, HER2 negative, Ki-67 30%.  Re-excision of left axillary lymph nodes on 08/28/2011 showed 0 of 4 nodes involved.  (2)  Enrolled in the NSABP B-49  protocol, or 1. Completed 4 cycles of adjuvant dose dense doxorubicin/ cyclophosphamide , followed by 12 weekly cycles of paclitaxel, completed 02/05/2012.   (3) received radiation therapy, completed 03/26/2012   (4) began tamoxifen, 20 mg daily, October 2013.   (5)  hot flashes associated with tamoxifen, currently on Effexor 37.5 mg daily.   (6) osteopenia, next bone density due August of 2015, T score -1.4  (7) status post simple hysterectomy, and no stopping the oophorectomy.      PLAN: Jaiyah is doing fine from a breast cancer point of view, with no evidence of disease recurrence. The plan is to do tamoxifen at least 5 years and then either continue tamoxifen for 5 more years or switch to an aromatase inhibitor. Note that the patient is status post hysterectomy, which makes the tamoxifen a more favorable choice.  She will see [place] again in March of 2015. She will then see Dr. Dwain Sarna around July of 2015. She will then see me January 2016 and we will start seeing her on a yearly basis alternating with Dr. Dwain Sarna from that point.  I am adding TTS one patches for her hot flashes. She will let me know if she has any side effects or if it does not work. I gave her instructions on hydration and potassium supplementation as well as stretching exercises for leg cramps.  She knows to call for any problems that may develop before next visit here.  Lowella Dell, MD   06/02/2013 9:13 AM

## 2013-06-03 NOTE — Addendum Note (Signed)
Addended by: Monico Blitz on: 06/03/2013 01:07 PM   Modules accepted: Orders

## 2013-06-08 ENCOUNTER — Other Ambulatory Visit: Payer: Self-pay | Admitting: *Deleted

## 2013-06-08 DIAGNOSIS — C50919 Malignant neoplasm of unspecified site of unspecified female breast: Secondary | ICD-10-CM

## 2013-06-08 MED ORDER — TAMOXIFEN CITRATE 20 MG PO TABS: 20.0000 mg | ORAL_TABLET | Freq: Every day | ORAL | Status: DC

## 2013-07-06 ENCOUNTER — Other Ambulatory Visit: Payer: Self-pay | Admitting: Oncology

## 2013-07-06 DIAGNOSIS — C50419 Malignant neoplasm of upper-outer quadrant of unspecified female breast: Secondary | ICD-10-CM

## 2013-07-09 ENCOUNTER — Telehealth: Payer: Self-pay | Admitting: Oncology

## 2013-08-14 ENCOUNTER — Telehealth (INDEPENDENT_AMBULATORY_CARE_PROVIDER_SITE_OTHER): Payer: Self-pay | Admitting: *Deleted

## 2013-08-14 NOTE — Telephone Encounter (Signed)
Pt left me a message requesting an order for her yearly diagnostic MM to be done at [facility].  Please address.

## 2013-08-17 ENCOUNTER — Other Ambulatory Visit (INDEPENDENT_AMBULATORY_CARE_PROVIDER_SITE_OTHER): Payer: Self-pay | Admitting: *Deleted

## 2013-08-17 DIAGNOSIS — Z853 Personal history of malignant neoplasm of breast: Secondary | ICD-10-CM

## 2013-08-17 NOTE — Telephone Encounter (Signed)
That is fine 

## 2013-09-01 ENCOUNTER — Encounter (INDEPENDENT_AMBULATORY_CARE_PROVIDER_SITE_OTHER): Payer: Self-pay

## 2013-09-07 ENCOUNTER — Other Ambulatory Visit (HOSPITAL_BASED_OUTPATIENT_CLINIC_OR_DEPARTMENT_OTHER): Payer: 59

## 2013-09-07 ENCOUNTER — Ambulatory Visit (HOSPITAL_BASED_OUTPATIENT_CLINIC_OR_DEPARTMENT_OTHER): Payer: 59 | Admitting: Physician Assistant

## 2013-09-07 ENCOUNTER — Encounter: Payer: Self-pay | Admitting: Physician Assistant

## 2013-09-07 VITALS — BP 138/93 | HR 105 | Temp 98.1°F | Resp 18 | Ht 67.0 in | Wt 189.1 lb

## 2013-09-07 DIAGNOSIS — C50919 Malignant neoplasm of unspecified site of unspecified female breast: Secondary | ICD-10-CM

## 2013-09-07 DIAGNOSIS — C50419 Malignant neoplasm of upper-outer quadrant of unspecified female breast: Secondary | ICD-10-CM

## 2013-09-07 DIAGNOSIS — Z780 Asymptomatic menopausal state: Secondary | ICD-10-CM | POA: Insufficient documentation

## 2013-09-07 DIAGNOSIS — M8580 Other specified disorders of bone density and structure, unspecified site: Secondary | ICD-10-CM

## 2013-09-07 DIAGNOSIS — M949 Disorder of cartilage, unspecified: Secondary | ICD-10-CM

## 2013-09-07 DIAGNOSIS — Z853 Personal history of malignant neoplasm of breast: Secondary | ICD-10-CM

## 2013-09-07 DIAGNOSIS — N951 Menopausal and female climacteric states: Secondary | ICD-10-CM

## 2013-09-07 DIAGNOSIS — C50412 Malignant neoplasm of upper-outer quadrant of left female breast: Secondary | ICD-10-CM

## 2013-09-07 DIAGNOSIS — Z170 Estrogen receptor positive status [ER+]: Secondary | ICD-10-CM

## 2013-09-07 DIAGNOSIS — R232 Flushing: Secondary | ICD-10-CM

## 2013-09-07 DIAGNOSIS — M899 Disorder of bone, unspecified: Secondary | ICD-10-CM

## 2013-09-07 LAB — COMPREHENSIVE METABOLIC PANEL (CC13)
ALT: 35 U/L (ref 0–55)
ANION GAP: 9 meq/L (ref 3–11)
AST: 27 U/L (ref 5–34)
Albumin: 4 g/dL (ref 3.5–5.0)
Alkaline Phosphatase: 56 U/L (ref 40–150)
BILIRUBIN TOTAL: 0.24 mg/dL (ref 0.20–1.20)
BUN: 17.9 mg/dL (ref 7.0–26.0)
CO2: 26 mEq/L (ref 22–29)
Calcium: 9.4 mg/dL (ref 8.4–10.4)
Chloride: 107 mEq/L (ref 98–109)
Creatinine: 0.7 mg/dL (ref 0.6–1.1)
GLUCOSE: 93 mg/dL (ref 70–140)
Potassium: 4.6 mEq/L (ref 3.5–5.1)
SODIUM: 143 meq/L (ref 136–145)
Total Protein: 6.7 g/dL (ref 6.4–8.3)

## 2013-09-07 LAB — CBC WITH DIFFERENTIAL/PLATELET
BASO%: 0.4 % (ref 0.0–2.0)
BASOS ABS: 0 10*3/uL (ref 0.0–0.1)
EOS ABS: 0.1 10*3/uL (ref 0.0–0.5)
EOS%: 2.1 % (ref 0.0–7.0)
HEMATOCRIT: 38.4 % (ref 34.8–46.6)
HEMOGLOBIN: 13.4 g/dL (ref 11.6–15.9)
LYMPH%: 29.7 % (ref 14.0–49.7)
MCH: 31.8 pg (ref 25.1–34.0)
MCHC: 34.9 g/dL (ref 31.5–36.0)
MCV: 91.2 fL (ref 79.5–101.0)
MONO#: 0.3 10*3/uL (ref 0.1–0.9)
MONO%: 6.9 % (ref 0.0–14.0)
NEUT#: 2.9 10*3/uL (ref 1.5–6.5)
NEUT%: 60.9 % (ref 38.4–76.8)
Platelets: 183 10*3/uL (ref 145–400)
RBC: 4.21 10*6/uL (ref 3.70–5.45)
RDW: 11.9 % (ref 11.2–14.5)
WBC: 4.8 10*3/uL (ref 3.9–10.3)
lymph#: 1.4 10*3/uL (ref 0.9–3.3)

## 2013-09-07 MED ORDER — VENLAFAXINE HCL 37.5 MG PO TABS: 37.5000 mg | ORAL_TABLET | Freq: Two times a day (BID) | ORAL | Status: DC

## 2013-09-07 MED ORDER — TAMOXIFEN CITRATE 20 MG PO TABS: 20.0000 mg | ORAL_TABLET | Freq: Every day | ORAL | Status: DC

## 2013-09-07 NOTE — Progress Notes (Signed)
ID: Emily Knight OB: Sep 22, 1955  MR#: [id number]  EHO#:[id number]  PCP: Gennette Pac, MD GYN:   SU:  Rolm Bookbinder, MD OTHER MD: Eppie Gibson, MD   HISTORY OF PRESENT ILLNESS: The patient was recalled for additional evaluateion when a screening mammogram showed an abnormality in the left breast.  A biopsy obtained on 07/24/2011 did confirm left invasive lobular carcinoma, grade 2, ER +20%, PR +10%, HER-2/neu negative, with MIB-1 is 1 of 30%.  A subsequent MRI obtained on 08/01/2011 confirmed a solitary enhancing mass with adjuvant satellite nodularity in the lateral aspect of the left breast, 2.2 x 1.4 x 2.5 cm. There were no enlarged axillary or internal mammary lymph nodes. No abnormal enhancement was seen in the right breast.  Patient subsequently underwent a left lumpectomy with sentinel node dissection under the care of Dr. Donne Hazel on 08/14/2011 revealing a 2.2 cm primary lesion, 2 of 3 sentinel lymph nodes involved with extracapsular extension. She then underwent reexcision of left axillary lymph nodes on 08/28/2011 showing 0 of 4 additional nodes involved.  Details regarding treatment history is noted below.   INTERVAL HISTORY: Emily Knight returns alone today for followup of her left breast carcinoma. Emily Knight continues on tamoxifen which she is tolerating well. She has occasional hot flashes, but nothing that is particularly problematic. She's had no vaginal changes other than perhaps some slight wetness. She's had no vaginal dryness and no abnormal vaginal bleeding. She continues to have some cramping in her legs at times, and also has pain in her knees bilaterally for which she takes Celebrex effectively. She denies any additional pain, no new or unusual joint pain elsewhere.  Otherwise, interval history is generally unremarkable.   REVIEW OF SYSTEMS: Emily Knight denies any recent illnesses and has had no fevers or chills. She denies any rashes or skin changes and has had no abnormal  bruising, bleeding, or clotting. Her energy level is good, as is her appetite. She denies any nausea or emesis and has had no change in bowel or bladder habits. She denies any cough, shortness of breath, peripheral swelling, chest pain, or palpitations. She's had no abnormal headaches or dizziness and denies any change in vision. She does feel more forgetful.  A detailed review of systems is otherwise stable and noncontributory.    PAST MEDICAL HISTORY: Past Medical History  Diagnosis Date  . Allergy   . Arthritis     left knee  . Stress fracture     left foot/ankle  . Hot flushes, perimenopausal   . Cancer     left breast  . Breast cancer 08/14/11 [facility]    Left breast lumpectomy/left axillary lymph node resection,invasive Grade II Lobular CA in Situ,,left lymph node bx==positive for metastatic lobular ca(2/2)  . Hot flashes   . Anxiety   . History of chemotherapy final 02/05/12    4 cycles Adriamycin/Cytoxan and 12 Weeks of Taxol  . S/P radiation therapy 02/12/12 -03/26/12    Left Breast, Left Axilla and Left Supraclivcular Region  . Use of tamoxifen (Nolvadex) Start - 04/10/12  . 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 Cerebral aneurysm     dx by cerebral angiogram,encased in bone    PAST SURGICAL HISTORY: Past Surgical History  Procedure Laterality Date  . Left knee reconstruction      six surgeries (3 orthoscopic) 3 open, ACL  . Tonsillectomy and adenoidectomy  1971  . Partial hysterectomy  1984? 1989?  for fibroids   . Trigger finger release  06/29/2008    release A-1 pulley and exc. cyst left thumb  . Breast lumpectomy  08/14/11    Left breast lumpectomy, left axillary sentinel node biopsy  . Abdominal hysterectomy  1980's    ovaries intact  . Axillary lymph node dissection  08/28/2011    Procedure: AXILLARY LYMPH NODE DISSECTION;  Surgeon: Rolm Bookbinder, MD;  Location: [place];  Service: General;  Laterality:  Left;  Left axillary node dissection, port placement  . Portacath placement  08/28/2011    Procedure: INSERTION PORT-A-CATH;  Surgeon: Rolm Bookbinder, MD;  Location: [place];  Service: General;  Laterality: Right;  . Port-a-cath removal  05/29/2012    Procedure: REMOVAL PORT-A-CATH;  Surgeon: Rolm Bookbinder, MD;  Location: [place];  Service: General;  Laterality: N/A;    FAMILY HISTORY Family History  Problem Relation Age of Onset  . Anesthesia problems Mother     post-op N/V  . Cancer Father 68    lung - ages 53 and 22; prostate  . Cancer Maternal Aunt 58    ovarian  . Cancer Paternal Aunt     lung  . Cancer Paternal Uncle     bone cancer  . Melanoma Cousin 82    female ,skin   the patient's father died at the age of 9 from metastatic lung cancer. He had a remote history of smoking. The patient's mother is living at age 51. The patient has 2 brothers, no sisters. There is no other history of breast cancer in the family. She did have a maternal aunt with ovarian cancer. The patient has been tested for the BRCA gene and is negative. In  GYNECOLOGIC HISTORY:  Menarche age 5. She is GX P0. She had a simple hysterectomy, no salpingo-oophorectomy at age 10. She never took hormone replacement.  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 At home she lives with Godfrey Pick and they recently got married. They also have 2 dogs.    ADVANCED DIRECTIVES: In place. Jackelyn Poling is Emily Knight's health care power of attorney   HEALTH MAINTENANCE: History  Substance Use Topics  . Smoking status: Former Smoker -- 1.00 packs/day for 15 years    Types: Cigarettes    Quit date: 01/23/2001  . Smokeless tobacco: Never Used     Comment: quit smoking 10 yrs. ago  . Alcohol Use: Yes     Comment:  occasionally     Colonoscopy: Not on file  PAP: Not on file/status post hysterectomy  Bone density: August 2013 at [facility], osteopenia with a T score of -1.4  Lipid panel: Not on file/Dr. Little   Allergies  Allergen Reactions  . Adhesive [Tape] Other (See Comments)    BLISTERS  . Codeine Nausea And Vomiting    Current Outpatient Prescriptions  Medication Sig Dispense Refill  . B Complex-C (B-COMPLEX WITH VITAMIN C) tablet Take 1 tablet by mouth 2 (two) times daily. Initiated at one tablet daily; increased dose to BID 01/15/2012 per Dr. Truddie Coco.      . CELEBREX 200 MG capsule       . cholecalciferol (VITAMIN D) 1000 UNITS tablet Take 1,000 Units by mouth daily.      . cloNIDine (CATAPRES-TTS-1) 0.1 mg/24hr patch Place 1 patch (0.1 mg total)  onto the skin once a week.  4 patch  12  . tamoxifen (NOLVADEX) 20 MG tablet Take 1 tablet (20 mg total) by mouth daily.  90 tablet  0  . venlafaxine (EFFEXOR) 37.5 MG tablet TAKE 1 TABLET BY MOUTH TWICE DAILY  60 tablet  2   No current facility-administered medications for this visit.    OBJECTIVE: Middle-aged white female who appears well and is in no acute distress Filed Vitals:   09/07/13 1116  BP: 138/93  Pulse: 105  Temp: 98.1 F (36.7 C)  Resp: 18     Body mass index is 29.61 kg/(m^2).    ECOG FS: 0 Filed Weights   09/07/13 1116  Weight: 189 lb 1.6 oz (85.775 kg)   Physical Exam: HEENT:  Sclerae anicteric.  Oropharynx clear, pink, and moist. Neck supple. Trachea midline. NODES:  No cervical or supraclavicular lymphadenopathy palpated.  BREAST EXAM: Right breast is unremarkable. Left breast is status post lumpectomy and radiation, with no suspicious nodularities or skin changes and no evidence of local recurrence. Axillae are benign bilaterally, with no palpable lymphadenopathy. LUNGS:  Clear to auscultation bilaterally with good excursion.  No wheezes or rhonchi HEART:  Regular rate and rhythm. No murmur  ABDOMEN:  Soft, nontender.   Positive bowel sounds.  MSK:  No focal spinal tenderness to palpation. Good range of motion bilaterally in the upper chest remedies. EXTREMITIES:  No peripheral edema.  No lymphedema noted in the left upper Chairman A. SKIN:  Benign with no visible rashes or skin lesions. No excessive ecchymoses. No petechiae. No pallor. NEURO:  Nonfocal. Well oriented.  Appropriate affect.    LAB RESULTS:   Lab Results  Component Value Date   WBC 4.8 09/07/2013   NEUTROABS 2.9 09/07/2013   HGB 13.4 09/07/2013   HCT 38.4 09/07/2013   MCV 91.2 09/07/2013   PLT 183 09/07/2013      Chemistry      Component Value Date/Time   NA 143 09/07/2013 0958   NA 137 02/04/2012 0850   K 4.6 09/07/2013 0958   K 3.9 02/04/2012 0850   CL 104 12/09/2012 0837   CL 103 02/04/2012 0850   CO2 26 09/07/2013 0958   CO2 25 02/04/2012 0850   BUN 17.9 09/07/2013 0958   BUN 12 02/04/2012 0850   CREATININE 0.7 09/07/2013 0958   CREATININE 0.77 02/04/2012 0850      Component Value Date/Time   CALCIUM 9.4 09/07/2013 0958   CALCIUM 9.6 02/04/2012 0850   ALKPHOS 56 09/07/2013 0958   ALKPHOS 50 02/04/2012 0850   AST 27 09/07/2013 0958   AST 32 02/04/2012 0850   ALT 35 09/07/2013 0958   ALT 51* 02/04/2012 0850   BILITOT 0.24 09/07/2013 0958   BILITOT 0.3 02/04/2012 0850      STUDIES:  This recent bilateral mammogram at [facility] on 08/17/2013 was unremarkable.  Bone density at [facility] 02/04/2012 showed mild osteopenia with a T score of -1.4    ASSESSMENT: 58 y.o. BRCA negative [place] woman  (1)  Status post LEFT lumpectomy with sentinel node dissection on 08/14/2011 for a pT2 pN1, stage IIB invasive lobular breast cancer,  ER 20%, PR 10%, HER2 negative, Ki-67 30%.  Re-excision of left axillary lymph nodes on 08/28/2011 showed 0 of 4 nodes involved.  (2)  Enrolled in the NSABP B-49  protocol, or 1. Completed 4 cycles of adjuvant dose dense doxorubicin/ cyclophosphamide , followed by 12 weekly cycles of paclitaxel, completed 02/05/2012.    (  3) received radiation therapy, completed 03/26/2012   (4) began tamoxifen, 20 mg daily, October 2013.   (5)  hot flashes associated with tamoxifen, currently on Effexor 75 mg daily and Catapres-TTS-1 patches  (6) osteopenia, next bone density due August of 2015, T score -1.4  (7) status post simple hysterectomy, and no salpingo-oophorectomy       PLAN: Emily Knight continues to do well with regards to her breast cancer, and there is no clinical evidence of disease recurrence at this time. I am making no changes her current regimen, and I am refilling both her tamoxifen and her venlafaxine. She has several refills remaining on the Catapres patches. Our plan is to continue tamoxifen for 5 years (October 2018), then discuss whether or not to continue tamoxifen for an additional 5 years or switch to an aromatase inhibitor. Of course, she is status post hysterectomy but not salpingo-oophorectomy which makes tamoxifen a more favorable choice.  Kinzie will continue to alternate her followups between [place] and Dr. Donne Hazel.  She will see Dr. Donne Hazel in June. She will have her repeat bone density as well as a restaging chest CT and PET scan in late August and will see [place] in early September to review those results. If she still doing well at that point, we will likely begin seeing her on an annual basis, still alternating her appointments with Dr. Donne Hazel every 6 months.  All of the above was reviewed in detail with Emily Knight today and she voices understanding and agreement. She will call with any changes or problems.   Emily Triggs, PA-C   09/07/2013 5:10 PM

## 2013-09-08 ENCOUNTER — Telehealth: Payer: Self-pay | Admitting: *Deleted

## 2013-09-08 NOTE — Telephone Encounter (Signed)
Per 3/23 POF I have scheduled appts. Advised scheduler of scans/referral.

## 2013-09-09 ENCOUNTER — Telehealth: Payer: Self-pay | Admitting: Oncology

## 2013-09-09 ENCOUNTER — Telehealth: Payer: Self-pay | Admitting: *Deleted

## 2013-09-09 NOTE — Telephone Encounter (Signed)
I have called and left a message for the patient with appts. I mailed calendar

## 2013-09-09 NOTE — Telephone Encounter (Signed)
, °

## 2013-12-22 ENCOUNTER — Encounter (INDEPENDENT_AMBULATORY_CARE_PROVIDER_SITE_OTHER): Payer: Self-pay | Admitting: General Surgery

## 2013-12-22 ENCOUNTER — Ambulatory Visit (INDEPENDENT_AMBULATORY_CARE_PROVIDER_SITE_OTHER): Payer: 59 | Admitting: General Surgery

## 2013-12-22 VITALS — BP 127/84 | HR 76 | Temp 98.6°F | Resp 18 | Ht 67.0 in | Wt 185.6 lb

## 2013-12-22 DIAGNOSIS — C50412 Malignant neoplasm of upper-outer quadrant of left female breast: Secondary | ICD-10-CM

## 2013-12-22 DIAGNOSIS — C50419 Malignant neoplasm of upper-outer quadrant of unspecified female breast: Secondary | ICD-10-CM

## 2013-12-22 NOTE — Progress Notes (Signed)
Subjective:     Patient ID: Emily Knight, female   DOB: Nov 24, 1955, 58 y.o.   MRN: [id number]  HPI This is a 58 year old female who has undergone left lumpectomy and eventual axillary lymph node dissection for stage II left breast cancer along with chemotherapy and radiation therapy. She is on tamoxifen. She is having some hot sweats from this. This is managed on some medication now though. Her left arm is doing just fine after having an injury to it previously. She's working at [employer] right now. She really has no complaints. She comes in for routine follow up. She has a mm 3/15 that is birads 2 and will repeat in one year.    Review of Systems     Objective:   Physical Exam  Constitutional: She appears well-developed and well-nourished.  Neck: Neck supple.  Cardiovascular: Normal rate, regular rhythm and normal heart sounds.   Pulmonary/Chest: Effort normal and breath sounds normal. She has no wheezes. She has no rales. Right breast exhibits no inverted nipple, no mass, no nipple discharge, no skin change and no tenderness. Left breast exhibits no inverted nipple, no mass, no nipple discharge, no skin change and no tenderness.    Lymphadenopathy:    She has no cervical adenopathy.    She has no axillary adenopathy.       Right: No supraclavicular adenopathy present.       Left: No supraclavicular adenopathy present.       Assessment:     Stage 2 breast cancer     Plan:     She has no clinical evidence of recurrence. I encouraged her to continue to stay active.  She will keep doing own self exams, get mm next march and see oncology in 6 months. I will see her back in one year or sooner if needed

## 2014-02-08 ENCOUNTER — Telehealth: Payer: Self-pay | Admitting: *Deleted

## 2014-02-08 NOTE — Telephone Encounter (Signed)
Pt called to this RN to state she has found a small nodule in R breast per self exam.  Per inquiry she states abnormality is in the right breast ( pt has hx of left breast cancer) approximately 1 inch below the nipple. Nodule is small pea size " but shaped more like a tiny football " Nodule is hard and smooth " but not movable " Nodule is " in the breast " per pt's description.  Pt is inquiring about " what to do next ", " I had a mammogram in March of this year "  Return call number- [phone].  This note will be given to MD for review and appropriate follow up.

## 2014-02-08 NOTE — Telephone Encounter (Signed)
Per MD review recommended for pt to come in for RN assessment of area.  Called and left message per above on identified VM.

## 2014-02-09 ENCOUNTER — Other Ambulatory Visit: Payer: Self-pay | Admitting: Oncology

## 2014-02-09 ENCOUNTER — Telehealth: Payer: Self-pay | Admitting: Oncology

## 2014-02-09 DIAGNOSIS — C50919 Malignant neoplasm of unspecified site of unspecified female breast: Secondary | ICD-10-CM

## 2014-02-09 NOTE — Telephone Encounter (Signed)
per pof to sch pt mamma-cld SOLIS to sch-gave pt copy of sch

## 2014-02-23 ENCOUNTER — Encounter (HOSPITAL_COMMUNITY): Payer: Self-pay

## 2014-02-23 ENCOUNTER — Encounter (HOSPITAL_COMMUNITY)
Admission: RE | Admit: 2014-02-23 | Discharge: 2014-02-23 | Disposition: A | Discharge status: Home / Self Care | Payer: 59 | Source: Ambulatory Visit | Attending: Physician Assistant | Admitting: Physician Assistant

## 2014-02-23 ENCOUNTER — Ambulatory Visit (HOSPITAL_COMMUNITY)
Admission: RE | Admit: 2014-02-23 | Discharge: 2014-02-23 | Disposition: A | Discharge status: Home / Self Care | Payer: 59 | Source: Ambulatory Visit | Attending: Physician Assistant | Admitting: Physician Assistant

## 2014-02-23 DIAGNOSIS — Z9221 Personal history of antineoplastic chemotherapy: Secondary | ICD-10-CM | POA: Diagnosis not present

## 2014-02-23 DIAGNOSIS — C50412 Malignant neoplasm of upper-outer quadrant of left female breast: Secondary | ICD-10-CM

## 2014-02-23 DIAGNOSIS — D739 Disease of spleen, unspecified: Secondary | ICD-10-CM | POA: Diagnosis not present

## 2014-02-23 DIAGNOSIS — C50419 Malignant neoplasm of upper-outer quadrant of unspecified female breast: Secondary | ICD-10-CM | POA: Diagnosis not present

## 2014-02-23 DIAGNOSIS — R932 Abnormal findings on diagnostic imaging of liver and biliary tract: Secondary | ICD-10-CM | POA: Insufficient documentation

## 2014-02-23 DIAGNOSIS — K7689 Other specified diseases of liver: Secondary | ICD-10-CM | POA: Insufficient documentation

## 2014-02-23 DIAGNOSIS — Z9889 Other specified postprocedural states: Secondary | ICD-10-CM | POA: Insufficient documentation

## 2014-02-23 DIAGNOSIS — Z923 Personal history of irradiation: Secondary | ICD-10-CM | POA: Diagnosis not present

## 2014-02-23 LAB — GLUCOSE, CAPILLARY: GLUCOSE-CAPILLARY: 102 mg/dL — AB (ref 70–99)

## 2014-02-23 MED ORDER — FLUDEOXYGLUCOSE F - 18 (FDG) INJECTION: 9.2000 | Freq: Once | INTRAVENOUS | Status: AC | PRN

## 2014-02-23 MED ORDER — IOHEXOL 300 MG/ML  SOLN
80.0000 mL | Freq: Once | INTRAMUSCULAR | Status: AC | PRN
  Administered 2014-02-23: 80 mL via INTRAVENOUS

## 2014-03-01 ENCOUNTER — Other Ambulatory Visit: Payer: 59

## 2014-03-03 ENCOUNTER — Telehealth: Payer: Self-pay | Admitting: Oncology

## 2014-03-03 ENCOUNTER — Ambulatory Visit (HOSPITAL_BASED_OUTPATIENT_CLINIC_OR_DEPARTMENT_OTHER): Payer: 59

## 2014-03-03 ENCOUNTER — Other Ambulatory Visit: Payer: Self-pay | Admitting: Emergency Medicine

## 2014-03-03 ENCOUNTER — Encounter: Payer: Self-pay | Admitting: Oncology

## 2014-03-03 DIAGNOSIS — C50412 Malignant neoplasm of upper-outer quadrant of left female breast: Secondary | ICD-10-CM

## 2014-03-03 DIAGNOSIS — C50419 Malignant neoplasm of upper-outer quadrant of unspecified female breast: Secondary | ICD-10-CM

## 2014-03-03 LAB — COMPREHENSIVE METABOLIC PANEL (CC13)
ALK PHOS: 70 U/L (ref 40–150)
ALT: 48 U/L (ref 0–55)
AST: 41 U/L — ABNORMAL HIGH (ref 5–34)
Albumin: 4.6 g/dL (ref 3.5–5.0)
Anion Gap: 10 mEq/L (ref 3–11)
BILIRUBIN TOTAL: 0.61 mg/dL (ref 0.20–1.20)
BUN: 20.7 mg/dL (ref 7.0–26.0)
CO2: 26 meq/L (ref 22–29)
CREATININE: 1 mg/dL (ref 0.6–1.1)
Calcium: 10.3 mg/dL (ref 8.4–10.4)
Chloride: 105 mEq/L (ref 98–109)
Glucose: 98 mg/dl (ref 70–140)
Potassium: 4.9 mEq/L (ref 3.5–5.1)
SODIUM: 141 meq/L (ref 136–145)
Total Protein: 7.7 g/dL (ref 6.4–8.3)

## 2014-03-03 LAB — CBC WITH DIFFERENTIAL/PLATELET
BASO%: 0.5 % (ref 0.0–2.0)
Basophils Absolute: 0 10*3/uL (ref 0.0–0.1)
EOS%: 1.4 % (ref 0.0–7.0)
Eosinophils Absolute: 0.1 10*3/uL (ref 0.0–0.5)
HEMATOCRIT: 41 % (ref 34.8–46.6)
HGB: 14.2 g/dL (ref 11.6–15.9)
LYMPH%: 28 % (ref 14.0–49.7)
MCH: 31.6 pg (ref 25.1–34.0)
MCHC: 34.6 g/dL (ref 31.5–36.0)
MCV: 91.3 fL (ref 79.5–101.0)
MONO#: 0.3 10*3/uL (ref 0.1–0.9)
MONO%: 4.4 % (ref 0.0–14.0)
NEUT#: 4.3 10*3/uL (ref 1.5–6.5)
NEUT%: 65.7 % (ref 38.4–76.8)
Platelets: 217 10*3/uL (ref 145–400)
RBC: 4.49 10*6/uL (ref 3.70–5.45)
RDW: 12.1 % (ref 11.2–14.5)
WBC: 6.6 10*3/uL (ref 3.9–10.3)
lymph#: 1.8 10*3/uL (ref 0.9–3.3)

## 2014-03-03 NOTE — Telephone Encounter (Signed)
, °

## 2014-03-08 ENCOUNTER — Telehealth: Payer: Self-pay | Admitting: Oncology

## 2014-03-08 ENCOUNTER — Telehealth: Payer: Self-pay | Admitting: *Deleted

## 2014-03-08 ENCOUNTER — Ambulatory Visit (HOSPITAL_BASED_OUTPATIENT_CLINIC_OR_DEPARTMENT_OTHER): Payer: 59 | Admitting: Oncology

## 2014-03-08 VITALS — BP 150/74 | HR 66 | Temp 98.4°F | Resp 18 | Ht 67.0 in | Wt 187.8 lb

## 2014-03-08 DIAGNOSIS — C50412 Malignant neoplasm of upper-outer quadrant of left female breast: Secondary | ICD-10-CM

## 2014-03-08 DIAGNOSIS — C50419 Malignant neoplasm of upper-outer quadrant of unspecified female breast: Secondary | ICD-10-CM

## 2014-03-08 DIAGNOSIS — M899 Disorder of bone, unspecified: Secondary | ICD-10-CM

## 2014-03-08 DIAGNOSIS — R61 Generalized hyperhidrosis: Secondary | ICD-10-CM

## 2014-03-08 DIAGNOSIS — K625 Hemorrhage of anus and rectum: Secondary | ICD-10-CM

## 2014-03-08 DIAGNOSIS — K7689 Other specified diseases of liver: Secondary | ICD-10-CM

## 2014-03-08 DIAGNOSIS — M949 Disorder of cartilage, unspecified: Secondary | ICD-10-CM

## 2014-03-08 NOTE — Telephone Encounter (Signed)
Per staff message and POF I have scheduled appts. Advised scheduler of appts. JMW  

## 2014-03-08 NOTE — Telephone Encounter (Signed)
per pof to sch appt-Sch appt w/Dr mann-faxed lab & bollod work to office @[phone] -gave pt copy of sch

## 2014-03-08 NOTE — Progress Notes (Signed)
ID: Emily Knight OB: Sep 14, 1955  MR#: [id number]  CSN#:[id number]  PCP: Emily Pac, MD GYN:   SU:  Emily Bookbinder, MD OTHER MD: Emily Gibson, MD   HISTORY OF PRESENT ILLNESS: The patient was recalled for additional evaluation when a screening mammogram showed an abnormality in the left breast.  A biopsy obtained on 07/24/2011 did confirm left invasive lobular carcinoma, grade 2, ER +20%, PR +10%, HER-2/neu negative, with MIB-1 is 1 of 30%.  A subsequent MRI obtained on 08/01/2011 confirmed a solitary enhancing mass with adjuvant satellite nodularity in the lateral aspect of the left breast, 2.2 x 1.4 x 2.5 cm. There were no enlarged axillary or internal mammary lymph nodes. No abnormal enhancement was seen in the right breast.  Patient subsequently underwent a left lumpectomy with sentinel node dissection under the care of Dr. Donne Knight on 08/14/2011 revealing a 2.2 cm primary lesion, 2 of 3 sentinel lymph nodes involved with extracapsular extension. She then underwent reexcision of left axillary lymph nodes on 08/28/2011 showing 0 of 4 additional nodes involved.  Details regarding treatment history is noted below.   INTERVAL HISTORY: Emily Knight returns alone today for followup of her left breast carcinoma. Emily Knight continues on tamoxifen which she is tolerating well. She has occasional hot flashes, but nothing that is particularly problematic. She's had no vaginal changes other than perhaps some slight wetness. She's had no vaginal dryness and no abnormal vaginal bleeding. She continues to have some cramping in her legs at times, and also has pain in her knees bilaterally for which she takes Celebrex effectively. She denies any additional pain, no new or unusual joint pain elsewhere.  Otherwise, interval history is generally unremarkable.   REVIEW OF SYSTEMS: Emily Knight denies any recent illnesses and has had no fevers or chills. She denies any rashes or skin changes and has had no abnormal  bruising, bleeding, or clotting. Her energy level is good, as is her appetite. She denies any nausea or emesis and has had no change in bowel or bladder habits. She denies any cough, shortness of breath, peripheral swelling, chest pain, or palpitations. She's had no abnormal headaches or dizziness and denies any change in vision. She does feel more forgetful.  A detailed review of systems is otherwise stable and noncontributory.    PAST MEDICAL HISTORY: Past Medical History  Diagnosis Date  . Allergy   . Arthritis     left knee  . Stress fracture     left foot/ankle  . Hot flushes, perimenopausal   . Cancer     left breast  . Breast cancer 08/14/11 [facility]    Left breast lumpectomy/left axillary lymph node resection,invasive Grade II Lobular CA in Situ,,left lymph node bx==positive for metastatic lobular ca(2/2)  . Hot flashes   . Anxiety   . History of chemotherapy final 02/05/12    4 cycles Adriamycin/Cytoxan and 12 Weeks of Taxol  . S/P radiation therapy 02/12/12 -03/26/12    Left Breast, Left Axilla and Left Supraclivcular Region  . Use of tamoxifen (Nolvadex) Start - 04/10/12  . 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 Cerebral aneurysm     dx by cerebral angiogram,encased in bone    PAST SURGICAL HISTORY: Past Surgical History  Procedure Laterality Date  . Left knee reconstruction      six surgeries (3 orthoscopic) 3 open, ACL  . Tonsillectomy and adenoidectomy  1971  . Partial hysterectomy  1984? 1989?  for fibroids   . Trigger finger release  06/29/2008    release A-1 pulley and exc. cyst left thumb  . Breast lumpectomy  08/14/11    Left breast lumpectomy, left axillary sentinel node biopsy  . Abdominal hysterectomy  1980's    ovaries intact  . Axillary lymph node dissection  08/28/2011    Procedure: AXILLARY LYMPH NODE DISSECTION;  Surgeon: Emily Bookbinder, MD;  Location: [facility];  Service: General;  Laterality:  Left;  Left axillary node dissection, port placement  . Portacath placement  08/28/2011    Procedure: INSERTION PORT-A-CATH;  Surgeon: Emily Bookbinder, MD;  Location: [facility];  Service: General;  Laterality: Right;  . Port-a-cath removal  05/29/2012    Procedure: REMOVAL PORT-A-CATH;  Surgeon: Emily Bookbinder, MD;  Location: [facility];  Service: General;  Laterality: N/A;    FAMILY HISTORY Family History  Problem Relation Age of Onset  . Anesthesia problems Mother     post-op N/V  . Cancer Father 25    lung - ages 71 and 61; prostate  . Cancer Maternal Aunt 58    ovarian  . Cancer Paternal Aunt     lung  . Cancer Paternal Uncle     bone cancer  . Melanoma Cousin 42    female ,skin   the patient's father died at the age of 29 from metastatic lung cancer. He had a remote history of smoking. The patient's mother is living at age 32. The patient has 2 brothers, no sisters. There is no other history of breast cancer in the family. She did have a maternal aunt with ovarian cancer. The patient has been tested for the BRCA gene and is negative. In  GYNECOLOGIC HISTORY:  Menarche age 43. She is GX P0. She had a simple hysterectomy, no salpingo-oophorectomy at age 35. She never took hormone replacement.  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 At home she lives with Emily Knight and they recently got married. They also have 2 dogs.    ADVANCED DIRECTIVES: In place. Emily Knight is Emily Knight's health care power of attorney   HEALTH MAINTENANCE: History  Substance Use Topics  . Smoking status: Former Smoker -- 1.00 packs/day for 15 years    Types: Cigarettes    Quit date: 01/23/2001  . Smokeless tobacco: Never Used     Comment: quit smoking 10 yrs. ago  . Alcohol Use: Yes     Comment:  occasionally     Colonoscopy: Not on file  PAP: /status post hysterectomy  Bone density: 02/09/2014 at [facility], osteopenia with a T score of - 2.0 (compared with1.4 - two years prior)   Lipid panel: Not on file/Dr. Little   Allergies  Allergen Reactions  . Adhesive [Tape] Other (See Comments)    BLISTERS  . Codeine Nausea And Vomiting    Current Outpatient Prescriptions  Medication Sig Dispense Refill  . B Complex-C (B-COMPLEX WITH VITAMIN C) tablet Take 1 tablet by mouth 2 (two) times daily. Initiated at one tablet daily; increased dose to BID 01/15/2012 per Dr. Truddie Coco.      . CELEBREX 200 MG capsule       . cholecalciferol (VITAMIN D) 1000 UNITS tablet Take 1,000 Units by mouth daily.      . cloNIDine (CATAPRES-TTS-1) 0.1 mg/24hr patch Place  1 patch (0.1 mg total) onto the skin once a week.  4 patch  12  . tamoxifen (NOLVADEX) 20 MG tablet Take 1 tablet (20 mg total) by mouth daily.  90 tablet  3  . venlafaxine (EFFEXOR) 37.5 MG tablet Take 1 tablet (37.5 mg total) by mouth 2 (two) times daily with a meal.  60 tablet  5   No current facility-administered medications for this visit.    OBJECTIVE: Middle-aged white female who appears well and is in no acute distress Filed Vitals:   03/08/14 1022  BP: 150/74  Pulse: 66  Temp: 98.4 F (36.9 C)  Resp: 18     Body mass index is 29.41 kg/(m^2).    ECOG FS: 0 Filed Weights   03/08/14 1022  Weight: 187 lb 12.8 oz (85.186 kg)   Physical Exam: HEENT:  Sclerae anicteric.  Oropharynx clear, pink, and moist. Neck supple. Trachea midline. NODES:  No cervical or supraclavicular lymphadenopathy palpated.  BREAST EXAM: Right breast is unremarkable. Left breast is status post lumpectomy and radiation, with no suspicious nodularities or skin changes and no evidence of local recurrence. Axillae are benign bilaterally, with no palpable lymphadenopathy. LUNGS:  Clear to auscultation bilaterally with good excursion.  No wheezes or rhonchi HEART:   Regular rate and rhythm. No murmur  ABDOMEN:  Soft, nontender.  Positive bowel sounds.  MSK:  No focal spinal tenderness to palpation. Good range of motion bilaterally in the upper chest remedies. EXTREMITIES:  No peripheral edema.  No lymphedema noted in the left upper Chairman A. SKIN:  Benign with no visible rashes or skin lesions. No excessive ecchymoses. No petechiae. No pallor. NEURO:  Nonfocal. Well oriented.  Appropriate affect.    LAB RESULTS:   Lab Results  Component Value Date   WBC 6.6 03/03/2014   NEUTROABS 4.3 03/03/2014   HGB 14.2 03/03/2014   HCT 41.0 03/03/2014   MCV 91.3 03/03/2014   PLT 217 03/03/2014      Chemistry      Component Value Date/Time   NA 141 03/03/2014 1403   NA 137 02/04/2012 0850   K 4.9 03/03/2014 1403   K 3.9 02/04/2012 0850   CL 104 12/09/2012 0837   CL 103 02/04/2012 0850   CO2 26 03/03/2014 1403   CO2 25 02/04/2012 0850   BUN 20.7 03/03/2014 1403   BUN 12 02/04/2012 0850   CREATININE 1.0 03/03/2014 1403   CREATININE 0.77 02/04/2012 0850      Component Value Date/Time   CALCIUM 10.3 03/03/2014 1403   CALCIUM 9.6 02/04/2012 0850   ALKPHOS 70 03/03/2014 1403   ALKPHOS 50 02/04/2012 0850   AST 41* 03/03/2014 1403   AST 32 02/04/2012 0850   ALT 48 03/03/2014 1403   ALT 51* 02/04/2012 0850   BILITOT 0.61 03/03/2014 1403   BILITOT 0.3 02/04/2012 0850      STUDIES:  This recent bilateral mammogram at [facility] on 08/17/2013 was unremarkable.   ASSESSMENT: 58 y.o. BRCA negative [place] woman  (1)  Status post LEFT lumpectomy with sentinel node dissection on 08/14/2011 for a pT2 pN1, stage IIB invasive lobular breast cancer,  ER 20%, PR 10%, HER2 negative, Ki-67 30%.  Re-excision of left axillary lymph nodes on 08/28/2011 showed 0 of 4 nodes involved.  (2)  Enrolled in the NSABP B-49  Protocol. Received 4 cycles of adjuvant dose dense doxorubicin/ cyclophosphamide , followed by 12 weekly cycles of paclitaxel, completed 02/05/2012.   (3)  radiation  therapy completed  03/26/2012   (4) began tamoxifen, 20 mg daily, October 2013.   (5)  hot flashes associated with tamoxifen, currently on Effexor 75 mg daily and Catapres-TTS-1 patches  (6) osteopenia,  bone density due August of 2015, T score - 2.0  (7) status post simple hysterectomy, and no salpingo-oophorectomy   (8) hepatic steatosis      PLAN: Kourtlyn is doing fine as far as her breast cancer is concerned and it is very gratifying that extensive staging studies show no evidence of disease recurrence. As far as that is concerned we are going to continue tamoxifen likely to a total of 10 years and she will start seeing me on a once a year basis alternating 6 months later with Dr. Donne Knight.  She does have 2 other active problems. She is having bright red blood per rectum. Hopefully this will prove to be hemorrhoidal, but she has never had a colonoscopy and I am referring her to Dr. Collene Mares for full valuation.  In addition her osteopenia is worse. She is already taking some walks and taking vitamin D. We discussed the oral bisphosphonates, zolendronate, and denosumab. After much discussion she decided she would like to zolendronate, because it is once a year, and because it has been associated with reduced risk of breast cancer recurrence. She will receive that later this week. She has a good understanding of the possible toxicities, side effects and complications of that agent.  Ragan will see me again a year from now. She has a good understanding of the overall plan. She agrees with it. She knows of the goal of treatment in her case is control. She will call with any problems that may develop before next visit.   Chauncey Cruel, MD   03/08/2014 10:30 AM

## 2014-03-08 NOTE — Addendum Note (Signed)
Addended by: Laureen Abrahams on: 03/08/2014 05:51 PM   Modules accepted: Orders

## 2014-03-09 ENCOUNTER — Telehealth: Payer: Self-pay | Admitting: Oncology

## 2014-03-09 NOTE — Telephone Encounter (Signed)
cld pt in re to inf appt

## 2014-03-11 ENCOUNTER — Ambulatory Visit (HOSPITAL_BASED_OUTPATIENT_CLINIC_OR_DEPARTMENT_OTHER): Payer: 59

## 2014-03-11 DIAGNOSIS — M8580 Other specified disorders of bone density and structure, unspecified site: Secondary | ICD-10-CM

## 2014-03-11 DIAGNOSIS — M949 Disorder of cartilage, unspecified: Secondary | ICD-10-CM

## 2014-03-11 DIAGNOSIS — M899 Disorder of bone, unspecified: Secondary | ICD-10-CM

## 2014-03-11 DIAGNOSIS — C50412 Malignant neoplasm of upper-outer quadrant of left female breast: Secondary | ICD-10-CM

## 2014-03-11 MED ORDER — ZOLEDRONIC ACID 4 MG/100ML IV SOLN
4.0000 mg | Freq: Once | INTRAVENOUS | Status: AC
  Administered 2014-03-11: 4 mg via INTRAVENOUS
  Filled 2014-03-11: qty 100

## 2014-03-11 NOTE — Patient Instructions (Signed)

## 2014-04-19 ENCOUNTER — Encounter (HOSPITAL_COMMUNITY): Payer: Self-pay

## 2014-06-23 ENCOUNTER — Other Ambulatory Visit: Payer: Self-pay | Admitting: Oncology

## 2014-06-24 ENCOUNTER — Other Ambulatory Visit: Payer: Self-pay | Admitting: *Deleted

## 2014-09-02 ENCOUNTER — Emergency Department (HOSPITAL_COMMUNITY)
Admission: EM | Admit: 2014-09-02 | Discharge: 2014-09-03 | Disposition: A | Discharge status: Home / Self Care | Payer: 59 | Attending: Emergency Medicine | Admitting: Emergency Medicine

## 2014-09-02 ENCOUNTER — Emergency Department (HOSPITAL_COMMUNITY): Payer: 59

## 2014-09-02 ENCOUNTER — Encounter (HOSPITAL_COMMUNITY): Payer: Self-pay | Admitting: Radiology

## 2014-09-02 DIAGNOSIS — Z79899 Other long term (current) drug therapy: Secondary | ICD-10-CM | POA: Insufficient documentation

## 2014-09-02 DIAGNOSIS — R110 Nausea: Secondary | ICD-10-CM | POA: Insufficient documentation

## 2014-09-02 DIAGNOSIS — H532 Diplopia: Secondary | ICD-10-CM

## 2014-09-02 DIAGNOSIS — R202 Paresthesia of skin: Secondary | ICD-10-CM

## 2014-09-02 DIAGNOSIS — R51 Headache: Secondary | ICD-10-CM | POA: Diagnosis not present

## 2014-09-02 DIAGNOSIS — Z8739 Personal history of other diseases of the musculoskeletal system and connective tissue: Secondary | ICD-10-CM | POA: Diagnosis not present

## 2014-09-02 DIAGNOSIS — Z853 Personal history of malignant neoplasm of breast: Secondary | ICD-10-CM | POA: Insufficient documentation

## 2014-09-02 DIAGNOSIS — R740 Nonspecific elevation of levels of transaminase and lactic acid dehydrogenase [LDH]: Secondary | ICD-10-CM | POA: Insufficient documentation

## 2014-09-02 DIAGNOSIS — Z8679 Personal history of other diseases of the circulatory system: Secondary | ICD-10-CM | POA: Diagnosis not present

## 2014-09-02 DIAGNOSIS — R7401 Elevation of levels of liver transaminase levels: Secondary | ICD-10-CM

## 2014-09-02 DIAGNOSIS — Z87891 Personal history of nicotine dependence: Secondary | ICD-10-CM | POA: Diagnosis not present

## 2014-09-02 DIAGNOSIS — F419 Anxiety disorder, unspecified: Secondary | ICD-10-CM | POA: Insufficient documentation

## 2014-09-02 DIAGNOSIS — Z8742 Personal history of other diseases of the female genital tract: Secondary | ICD-10-CM | POA: Diagnosis not present

## 2014-09-02 DIAGNOSIS — Z79818 Long term (current) use of other agents affecting estrogen receptors and estrogen levels: Secondary | ICD-10-CM | POA: Insufficient documentation

## 2014-09-02 DIAGNOSIS — R200 Anesthesia of skin: Secondary | ICD-10-CM

## 2014-09-02 DIAGNOSIS — Z9221 Personal history of antineoplastic chemotherapy: Secondary | ICD-10-CM | POA: Diagnosis not present

## 2014-09-02 DIAGNOSIS — G441 Vascular headache, not elsewhere classified: Secondary | ICD-10-CM

## 2014-09-02 DIAGNOSIS — R519 Headache, unspecified: Secondary | ICD-10-CM

## 2014-09-02 LAB — I-STAT CHEM 8, ED
BUN: 24 mg/dL — ABNORMAL HIGH (ref 6–23)
CHLORIDE: 105 mmol/L (ref 96–112)
Calcium, Ion: 1.2 mmol/L (ref 1.12–1.23)
Creatinine, Ser: 0.8 mg/dL (ref 0.50–1.10)
Glucose, Bld: 96 mg/dL (ref 70–99)
HCT: 42 % (ref 36.0–46.0)
HEMOGLOBIN: 14.3 g/dL (ref 12.0–15.0)
POTASSIUM: 3.8 mmol/L (ref 3.5–5.1)
Sodium: 142 mmol/L (ref 135–145)
TCO2: 23 mmol/L (ref 0–100)

## 2014-09-02 LAB — CBC
HCT: 41.1 % (ref 36.0–46.0)
Hemoglobin: 14.1 g/dL (ref 12.0–15.0)
MCH: 31.9 pg (ref 26.0–34.0)
MCHC: 34.3 g/dL (ref 30.0–36.0)
MCV: 93 fL (ref 78.0–100.0)
PLATELETS: 189 10*3/uL (ref 150–400)
RBC: 4.42 MIL/uL (ref 3.87–5.11)
RDW: 11.9 % (ref 11.5–15.5)
WBC: 8.7 10*3/uL (ref 4.0–10.5)

## 2014-09-02 LAB — DIFFERENTIAL
Basophils Absolute: 0.1 10*3/uL (ref 0.0–0.1)
Basophils Relative: 1 % (ref 0–1)
Eosinophils Absolute: 0.1 10*3/uL (ref 0.0–0.7)
Eosinophils Relative: 1 % (ref 0–5)
Lymphocytes Relative: 35 % (ref 12–46)
Lymphs Abs: 3.1 10*3/uL (ref 0.7–4.0)
MONO ABS: 0.4 10*3/uL (ref 0.1–1.0)
MONOS PCT: 5 % (ref 3–12)
Neutro Abs: 5.1 10*3/uL (ref 1.7–7.7)
Neutrophils Relative %: 58 % (ref 43–77)

## 2014-09-02 LAB — I-STAT TROPONIN, ED: TROPONIN I, POC: 0 ng/mL (ref 0.00–0.08)

## 2014-09-02 MED ORDER — METOCLOPRAMIDE HCL 5 MG/ML IJ SOLN
10.0000 mg | Freq: Once | INTRAMUSCULAR | Status: AC
  Administered 2014-09-02: 10 mg via INTRAVENOUS
  Filled 2014-09-02: qty 2

## 2014-09-02 MED ORDER — SODIUM CHLORIDE 0.9 % IV SOLN: 1000.0000 mL | INTRAVENOUS | Status: DC

## 2014-09-02 MED ORDER — DIPHENHYDRAMINE HCL 50 MG/ML IJ SOLN
25.0000 mg | Freq: Once | INTRAMUSCULAR | Status: AC
  Administered 2014-09-02: 25 mg via INTRAVENOUS
  Filled 2014-09-02: qty 1

## 2014-09-02 MED ORDER — SODIUM CHLORIDE 0.9 % IV SOLN
1000.0000 mL | Freq: Once | INTRAVENOUS | Status: AC
  Administered 2014-09-02: 1000 mL via INTRAVENOUS

## 2014-09-02 NOTE — Progress Notes (Signed)
Code stroke called on 59 y.o female. Pt complained of headache, double vision left eye, numbness and tingling in left arm, difficulty walking which started at 2030. However pt spoke with friend on phone at 2000 and she shared with friend she wasn't feeling well. LSN 2000. Pertinent history includes left breast CA with resection and chemo in 2013, Cerebral aneurysm, and anxiety. Labs done upon arrival to ED, CBG 123. Pt taken to CT scan and NIHSS completed upon arrival to ED room. NIHSS yielding 1 for sensory deficit on left face and left arm. Pt for MRI tonight for imaging. CT negative for acute changes per Neurologist. Pt for MRI tonight and further stroke work up based on imaging results. Pt and family at bedside updated on plan of care.

## 2014-09-02 NOTE — ED Notes (Signed)
MD at bedside. 

## 2014-09-02 NOTE — ED Notes (Signed)
Patient presents stating about 2000 or 2030 stated she started feeling different.  Unable to focus with her left eye with decreased sensory to the left side of her face.  Also stated she had some tingling to her left finger tips. Stated she felt like she was drunk when she tried to walk

## 2014-09-02 NOTE — Consult Note (Signed)
Referring Physician: Roxanne Mins    Chief Complaint: Diplopia and left sided numbness  HPI: Emily Knight is an 59 y.o. female who reports that she was working in the yard today and gelt at baseline.  When she came in she began to experience a frontal headache.  Patient then noted left monocular diplopia and numbness on the left side.  Her symptoms did not resolve and EMS was called.  The patent was brought in as a code stroke.  Diplopia had resovled by presentation.  Rates current headache at a 2/10.  Initial NIHSS of 1.    Date last known well: Date: 09/02/2014 Time last known well: Time: 20:00 tPA Given: No: Improvement in symptoms  Past Medical History  Diagnosis Date  . Allergy   . Arthritis     left knee  . Stress fracture     left foot/ankle  . Hot flushes, perimenopausal   . Cancer     left breast  . Breast cancer 08/14/11 [facility]    Left breast lumpectomy/left axillary lymph node resection,invasive Grade II Lobular CA in Situ,,left lymph node bx==positive for metastatic lobular ca(2/2)  . Hot flashes   . Anxiety   . History of chemotherapy final 02/05/12    4 cycles Adriamycin/Cytoxan and 12 Weeks of Taxol  . S/P radiation therapy 02/12/12 -03/26/12    Left Breast, Left Axilla and Left Supraclivcular Region  . Use of tamoxifen (Nolvadex) Start - 04/10/12  . 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 Cerebral aneurysm     dx by cerebral angiogram,encased in bone    Past Surgical History  Procedure Laterality Date  . Left knee reconstruction      six surgeries (3 orthoscopic) 3 open, ACL  . Tonsillectomy and adenoidectomy  1971  . Partial hysterectomy  1984? 1989?     for fibroids   . Trigger finger release  06/29/2008    release A-1 pulley and exc. cyst left thumb  . Breast lumpectomy  08/14/11    Left breast lumpectomy, left axillary sentinel node biopsy  . Abdominal hysterectomy  1980's    ovaries intact  . Axillary lymph node dissection   08/28/2011    Procedure: AXILLARY LYMPH NODE DISSECTION;  Surgeon: Rolm Bookbinder, MD;  Location: [facility];  Service: General;  Laterality: Left;  Left axillary node dissection, port placement  . Portacath placement  08/28/2011    Procedure: INSERTION PORT-A-CATH;  Surgeon: Rolm Bookbinder, MD;  Location: [place];  Service: General;  Laterality: Right;  . Port-a-cath removal  05/29/2012    Procedure: REMOVAL PORT-A-CATH;  Surgeon: Rolm Bookbinder, MD;  Location: [facility];  Service: General;  Laterality: N/A;    Family History  Problem Relation Age of Onset  . Anesthesia problems Mother     post-op N/V  . Cancer Father 24    lung - ages 85 and 84; prostate  . Cancer Maternal Aunt 58    ovarian  . Cancer Paternal Aunt     lung  . Cancer Paternal Uncle     bone cancer  . Melanoma Cousin 54    female ,skin   Social History:  reports that she quit smoking about 13 years ago. Her smoking use included Cigarettes. She has a 15 pack-year smoking history. She has never used smokeless tobacco. She reports that she drinks alcohol. She reports that she does not use illicit drugs.  Allergies:  Allergies  Allergen Reactions  . Adhesive [Tape] Other (See Comments)    BLISTERS  . Codeine Nausea And Vomiting    Medications: I have reviewed the patient's current medications. Prior to Admission:  Current outpatient prescriptions:  .  CELEBREX 200 MG capsule, , Disp: , Rfl:  .  cholecalciferol (VITAMIN D) 1000 UNITS tablet, Take 1,000 Units by mouth daily., Disp: , Rfl:  .  cloNIDine (CATAPRES - DOSED IN MG/24 HR) 0.1 mg/24hr patch, APPLY 1 PATCH TO THE SKIN ONCE A WEEK, Disp: 4 patch, Rfl: 5 .  tamoxifen (NOLVADEX) 20 MG tablet, Take 1 tablet (20 mg total) by mouth daily., Disp: 90 tablet, Rfl: 3 .  venlafaxine (EFFEXOR) 37.5 MG tablet, Take 1 tablet (37.5 mg total) by mouth 2 (two) times daily with a meal., Disp: 60 tablet, Rfl:  5  ROS: History obtained from the patient  General ROS: negative for - chills, fatigue, fever, night sweats, weight gain or weight loss Psychological ROS: negative for - behavioral disorder, hallucinations, memory difficulties, mood swings or suicidal ideation Ophthalmic ROS: as noted in HPI ENT ROS: negative for - epistaxis, nasal discharge, oral lesions, sore throat, tinnitus or vertigo Allergy and Immunology ROS: negative for - hives or itchy/watery eyes Hematological and Lymphatic ROS: negative for - bleeding problems, bruising or swollen lymph nodes Endocrine ROS: hot flashes Respiratory ROS: negative for - cough, hemoptysis, shortness of breath or wheezing Cardiovascular ROS: negative for - chest pain, dyspnea on exertion, edema or irregular heartbeat Gastrointestinal ROS: negative for - abdominal pain, diarrhea, hematemesis, nausea/vomiting or stool incontinence Genito-Urinary ROS: negative for - dysuria, hematuria, incontinence or urinary frequency/urgency Musculoskeletal ROS: negative for - joint swelling or muscular weakness Neurological ROS: as noted in HPI, paresthesias secondary to neuropathy Dermatological ROS: negative for rash and skin lesion changes  Physical Examination: Blood pressure 156/87, pulse 59, temperature 97.7 F (36.5 C), temperature source Oral, resp. rate 19, SpO2 99 %.  HEENT-  Normocephalic, no lesions, without obvious abnormality.  Normal external eye and conjunctiva.  Normal TM's bilaterally.  Normal auditory canals and external ears. Normal external nose, mucus membranes and septum.  Normal pharynx. Cardiovascular- S1, S2 normal, pulses palpable throughout   Lungs- chest clear, no wheezing, rales, normal symmetric air entry Abdomen- soft, non-tender; bowel sounds normal; no masses,  no organomegaly Extremities- no edema Lymph-no adenopathy palpable Musculoskeletal-no joint tenderness, deformity or swelling Skin-warm and dry, no hyperpigmentation,  vitiligo, or suspicious lesions  Neurological Examination Mental Status: Alert, oriented, thought content appropriate.  Speech fluent without evidence of aphasia.  Able to follow 3 step commands without difficulty. Cranial Nerves: II: Discs flat bilaterally; Visual fields grossly normal, pupils equal, round, reactive to light and accommodation III,IV, VI: ptosis not present, extra-ocular motions intact bilaterally V,VII: smile symmetric, facial light touch sensation decreased on the left side of her face VIII: hearing normal bilaterally IX,X: gag reflex present XI: bilateral shoulder shrug XII: midline tongue extension Motor: Right : Upper extremity   5/5    Left:     Upper extremity   5/5  Lower extremity   5/5     Lower extremity   5/5 Tone and bulk:normal tone throughout; no atrophy noted Sensory: Pinprick and light touch decreased in the LUE Deep Tendon Reflexes: 2+ and symmetric with absent AJ's bilaterally Plantars: Right: downgoing   Left: downgoing Cerebellar: normal finger-to-nose and normal heel-to-shin testing bilaterally Gait: not tested due to safety concerns      Laboratory Studies:  Basic Metabolic  Panel:  Recent Labs Lab 09/02/14 2307  NA 142  K 3.8  CL 105  GLUCOSE 96  BUN 24*  CREATININE 0.80    Liver Function Tests: No results for input(s): AST, ALT, ALKPHOS, BILITOT, PROT, ALBUMIN in the last 168 hours. No results for input(s): LIPASE, AMYLASE in the last 168 hours. No results for input(s): AMMONIA in the last 168 hours.  CBC:  Recent Labs Lab 09/02/14 2307  HGB 14.3  HCT 42.0    Cardiac Enzymes: No results for input(s): CKTOTAL, CKMB, CKMBINDEX, TROPONINI in the last 168 hours.  BNP: Invalid input(s): POCBNP  CBG: No results for input(s): GLUCAP in the last 168 hours.  Microbiology: Results for orders placed or performed in visit on 01/15/12  Rapid strep screen     Status: None   Collection Time: 01/15/12  1:17 PM  Result Value  Ref Range Status   Streptococcus, Group A Screen (Direct) NEG NEGATIVE Final   Source THROAT  Final  Throat culture     Status: None   Collection Time: 01/15/12  1:17 PM  Result Value Ref Range Status   Culture, Throat Culture, Throat  Final    Comment: Final - ===== FINAL REPORT =====Normal Upper Respiratory FloraNo Beta Hemolytic Streptococci Isolated    Coagulation Studies: No results for input(s): LABPROT, INR in the last 72 hours.  Urinalysis: No results for input(s): COLORURINE, LABSPEC, PHURINE, GLUCOSEU, HGBUR, BILIRUBINUR, KETONESUR, PROTEINUR, UROBILINOGEN, NITRITE, LEUKOCYTESUR in the last 168 hours.  Invalid input(s): APPERANCEUR  Lipid Panel:    Component Value Date/Time   CHOL  08/10/2010 1531    167        ATP III CLASSIFICATION:  <200     mg/dL   Desirable  200-239  mg/dL   Borderline High  >=240    mg/dL   High          TRIG 149 08/10/2010 1531   HDL 36* 08/10/2010 1531   CHOLHDL 4.6 08/10/2010 1531   VLDL 30 08/10/2010 1531   LDLCALC * 08/10/2010 1531    101        Total Cholesterol/HDL:CHD Risk Coronary Heart Disease Risk Table                     Men   Women  1/2 Average Risk   3.4   3.3  Average Risk       5.0   4.4  2 X Average Risk   9.6   7.1  3 X Average Risk  23.4   11.0        Use the calculated Patient Ratio above and the CHD Risk Table to determine the patient's CHD Risk.        ATP III CLASSIFICATION (LDL):  <100     mg/dL   Optimal  100-129  mg/dL   Near or Above                    Optimal  130-159  mg/dL   Borderline  160-189  mg/dL   High  >190     mg/dL   Very High    HgbA1C: No results found for: HGBA1C  Urine Drug Screen:  No results found for: LABOPIA, COCAINSCRNUR, LABBENZ, AMPHETMU, THCU, LABBARB  Alcohol Level: No results for input(s): ETH in the last 168 hours.  Other results: EKG:  sinus rhythm at 60 bpm.  Imaging: Ct Head Wo Contrast  09/02/2014   CLINICAL DATA:  Code stroke.  Left-sided numbness, acute onset.  Initial encounter.  EXAM: CT HEAD WITHOUT CONTRAST  TECHNIQUE: Contiguous axial images were obtained from the base of the skull through the vertex without intravenous contrast.  COMPARISON:  MRI of the brain performed 02/21/2011  FINDINGS: There is no evidence of acute infarction, mass lesion, or intra- or extra-axial hemorrhage on CT.  The posterior fossa, including the cerebellum, brainstem and fourth ventricle, is within normal limits. The third and lateral ventricles, and basal ganglia are unremarkable in appearance. The cerebral hemispheres are symmetric in appearance, with normal gray-white differentiation. No mass effect or midline shift is seen.  There is no evidence of fracture; visualized osseous structures are unremarkable in appearance. The orbits are within normal limits. Mucus retention cysts or polyps are seen at the maxillary sinuses bilaterally. There is mild partial opacification of the left mastoid air cells. The remaining paranasal sinuses and right mastoid air cells are well-aerated. No significant soft tissue abnormalities are seen.  IMPRESSION: 1. No acute intracranial pathology seen on CT. 2. Mucus retention cysts or polyps at the maxillary sinuses bilaterally. Mild partial opacification of the left mastoid air cells.  These results were called by telephone at the time of interpretation on 09/02/2014 at 11:13 pm to Dr. Doy Mince, who verbally acknowledged these results.   Electronically Signed   By: Garald Balding M.D.   On: 09/02/2014 23:23    Assessment: 59 y.o. female presenting with complaints of headache, diplopia and left sided numbness.  Headache is not severe.  Diplopia has resolved.  NIHSS low at 1.  Head CT reviewed personally and shows no acute changes.  Patient without stroke risk factors.  On no antiplatelet or anticoagulant therapy.  Further work up recommended.    Stroke Risk Factors - none  Plan: 1. HgbA1c, fasting lipid panel 2. MRI of the brain without contrast.   Further stroke work up to be based on results of MR imaging.   3. Prophylactic therapy-Antiplatelet med: Aspirin - dose 81mg  daily 4. NPO until RN stroke swallow screen 5. Telemetry monitoring 6. Frequent neuro checks  Case discussed with Dr. Orvan Falconer, MD [facility] [phone] 09/02/2014, 11:31 PM   Addendum: MRI of the brain reviewed and shows no acute changes.  Do not suspect acute infarct at this time.  Can not rule out the possibility of a complicated migraine.  Headache minimal at this time.    Recommendations: 1.  No further neurologic intervention is recommended at this time.  Thank you for allowing neurology to participate in the care of this patient.  Patient to follow up with PCP as an outpatient.    Alexis Goodell, MD [facility] [phone] 09/03/2014  1:52 AM

## 2014-09-02 NOTE — ED Provider Notes (Signed)
CSN: [id number]     Arrival date & time 09/02/14  2254 History  This chart was scribed for Delora Fuel, MD by Rayfield Citizen, ED Scribe. This patient was seen in room A04C/A04C and the patient's care was started at 11:21 PM.    No chief complaint on file.  The history is provided by the patient. No language interpreter was used.    HPI Comments: Emily Knight is a 59 y.o. female with past medical history of cancer (left breast, lumpectomy and chemotherapy, radiation in 2013), carotid artery aneurysm, cerebral aneurysm who presents to the Emergency Department complaining of headache, nausea and vision disturbance (blurred, side-by-side double vision in left eye only). She notes paresthesias in the fingers of her left hand. Patient explains she was very active today; mowing her lawn, working in the yard, etc. When she returned inside to eat dinner, she began to feel unwell: her symptoms began with headache, exacerbated by bending forward, and progressed to nausea, blurred and then double vision. She denies numbness, weakness at any point. She reports that her paresthesias are gradually improving at present; her headache was originally rated 6/10 at worst but has improved to a 2/10 at this time. She states that today's headache was similar to previous experience with headaches.   Past Medical History  Diagnosis Date  . Allergy   . Arthritis     left knee  . Stress fracture     left foot/ankle  . Hot flushes, perimenopausal   . Cancer     left breast  . Breast cancer 08/14/11 [facility]    Left breast lumpectomy/left axillary lymph node resection,invasive Grade II Lobular CA in Situ,,left lymph node bx==positive for metastatic lobular ca(2/2)  . Hot flashes   . Anxiety   . History of chemotherapy final 02/05/12    4 cycles Adriamycin/Cytoxan and 12 Weeks of Taxol  . S/P radiation therapy 02/12/12 -03/26/12    Left Breast, Left Axilla and Left Supraclivcular Region  . Use of tamoxifen (Nolvadex) Start -  04/10/12  . 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 Cerebral aneurysm     dx by cerebral angiogram,encased in bone   Past Surgical History  Procedure Laterality Date  . Left knee reconstruction      six surgeries (3 orthoscopic) 3 open, ACL  . Tonsillectomy and adenoidectomy  1971  . Partial hysterectomy  1984? 1989?     for fibroids   . Trigger finger release  06/29/2008    release A-1 pulley and exc. cyst left thumb  . Breast lumpectomy  08/14/11    Left breast lumpectomy, left axillary sentinel node biopsy  . Abdominal hysterectomy  1980's    ovaries intact  . Axillary lymph node dissection  08/28/2011    Procedure: AXILLARY LYMPH NODE DISSECTION;  Surgeon: Rolm Bookbinder, MD;  Location: [facility];  Service: General;  Laterality: Left;  Left axillary node dissection, port placement  . Portacath placement  08/28/2011    Procedure: INSERTION PORT-A-CATH;  Surgeon: Rolm Bookbinder, MD;  Location: [place];  Service: General;  Laterality: Right;  . Port-a-cath removal  05/29/2012    Procedure: REMOVAL PORT-A-CATH;  Surgeon: Rolm Bookbinder, MD;  Location: [place];  Service: General;  Laterality: N/A;   Family History  Problem Relation Age of Onset  . Anesthesia problems Mother     post-op N/V  . Cancer Father 75    lung -  ages 48 and 39; prostate  . Cancer Maternal Aunt 58    ovarian  . Cancer Paternal Aunt     lung  . Cancer Paternal Uncle     bone cancer  . Melanoma Cousin 67    female ,skin   History  Substance Use Topics  . Smoking status: Former Smoker -- 1.00 packs/day for 15 years    Types: Cigarettes    Quit date: 01/23/2001  . Smokeless tobacco: Never Used     Comment: quit smoking 10 yrs. ago  . Alcohol Use: Yes     Comment: occasionally   OB History    Gravida Para Term Preterm AB TAB SAB Ectopic Multiple Living   0 0              Obstetric Comments   HRT x 1  yr     Review of Systems  Eyes: Positive for visual disturbance.  Gastrointestinal: Positive for nausea.  Neurological: Positive for headaches.  All other systems reviewed and are negative.   Allergies  Adhesive and Codeine  Home Medications   Prior to Admission medications   Medication Sig Start Date End Date Taking? Authorizing Provider  CELEBREX 200 MG capsule  11/03/12   Historical Provider, MD  cholecalciferol (VITAMIN D) 1000 UNITS tablet Take 1,000 Units by mouth daily. 02/29/12   Eston Esters, MD  cloNIDine (CATAPRES - DOSED IN MG/24 HR) 0.1 mg/24hr patch APPLY 1 PATCH TO THE SKIN ONCE A WEEK 06/24/14   Chauncey Cruel, MD  tamoxifen (NOLVADEX) 20 MG tablet Take 1 tablet (20 mg total) by mouth daily. 09/07/13   Amy Milda Smart, PA-C  venlafaxine (EFFEXOR) 37.5 MG tablet Take 1 tablet (37.5 mg total) by mouth 2 (two) times daily with a meal. 09/07/13   Amy G Berry, PA-C   BP 156/87 mmHg  Pulse 59  Temp(Src) 97.7 F (36.5 C) (Oral)  Resp 19  SpO2 99% Physical Exam  Constitutional: She is oriented to person, place, and time. She appears well-developed and well-nourished. No distress.  HENT:  Head: Normocephalic and atraumatic.  Mouth/Throat: Oropharynx is clear and moist. No oropharyngeal exudate.  Eyes: EOM are normal. Pupils are equal, round, and reactive to light.  Fundi are normal  Neck: Normal range of motion. Neck supple.  No carotid bruits  Cardiovascular: Normal rate, regular rhythm and normal heart sounds.  Exam reveals no gallop and no friction rub.   No murmur heard. Pulmonary/Chest: Effort normal. No respiratory distress. She has no wheezes. She has no rales.  Abdominal: Soft. There is no tenderness. There is no rebound and no guarding.  Musculoskeletal: Normal range of motion. She exhibits no edema.  Neurological: She is alert and oriented to person, place, and time.  Finger to nose normal  Skin: Skin is warm and dry. No rash noted.  Psychiatric: She has a  normal mood and affect. Her behavior is normal.  Nursing note and vitals reviewed.   ED Course  Procedures   DIAGNOSTIC STUDIES: Oxygen Saturation is 99% on RA, normal by my interpretation.    COORDINATION OF CARE: 11:26 PM Discussed treatment plan with pt at bedside and pt agreed to plan.   Labs Review Results for orders placed or performed during the hospital encounter of 09/02/14  Ethanol  Result Value Ref Range   Alcohol, Ethyl (B) <5 0 - 9 mg/dL  Protime-INR  Result Value Ref Range   Prothrombin Time 12.6 11.6 - 15.2 seconds   INR 0.93  0.00 - 1.49  APTT  Result Value Ref Range   aPTT 27 24 - 37 seconds  CBC  Result Value Ref Range   WBC 8.7 4.0 - 10.5 K/uL   RBC 4.42 3.87 - 5.11 MIL/uL   Hemoglobin 14.1 12.0 - 15.0 g/dL   HCT 41.1 36.0 - 46.0 %   MCV 93.0 78.0 - 100.0 fL   MCH 31.9 26.0 - 34.0 pg   MCHC 34.3 30.0 - 36.0 g/dL   RDW 11.9 11.5 - 15.5 %   Platelets 189 150 - 400 K/uL  Differential  Result Value Ref Range   Neutrophils Relative % 58 43 - 77 %   Neutro Abs 5.1 1.7 - 7.7 K/uL   Lymphocytes Relative 35 12 - 46 %   Lymphs Abs 3.1 0.7 - 4.0 K/uL   Monocytes Relative 5 3 - 12 %   Monocytes Absolute 0.4 0.1 - 1.0 K/uL   Eosinophils Relative 1 0 - 5 %   Eosinophils Absolute 0.1 0.0 - 0.7 K/uL   Basophils Relative 1 0 - 1 %   Basophils Absolute 0.1 0.0 - 0.1 K/uL  Comprehensive metabolic panel  Result Value Ref Range   Sodium 142 135 - 145 mmol/L   Potassium 3.8 3.5 - 5.1 mmol/L   Chloride 105 96 - 112 mmol/L   CO2 25 19 - 32 mmol/L   Glucose, Bld 91 70 - 99 mg/dL   BUN 21 6 - 23 mg/dL   Creatinine, Ser 0.81 0.50 - 1.10 mg/dL   Calcium 10.2 8.4 - 10.5 mg/dL   Total Protein 6.9 6.0 - 8.3 g/dL   Albumin 4.5 3.5 - 5.2 g/dL   AST 39 (H) 0 - 37 U/L   ALT 44 (H) 0 - 35 U/L   Alkaline Phosphatase 51 39 - 117 U/L   Total Bilirubin 0.5 0.3 - 1.2 mg/dL   GFR calc non Af Amer 79 (L) >90 mL/min   GFR calc Af Amer >90 >90 mL/min   Anion gap 12 5 - 15   I-Stat Chem 8, ED  Result Value Ref Range   Sodium 142 135 - 145 mmol/L   Potassium 3.8 3.5 - 5.1 mmol/L   Chloride 105 96 - 112 mmol/L   BUN 24 (H) 6 - 23 mg/dL   Creatinine, Ser 0.80 0.50 - 1.10 mg/dL   Glucose, Bld 96 70 - 99 mg/dL   Calcium, Ion 1.20 1.12 - 1.23 mmol/L   TCO2 23 0 - 100 mmol/L   Hemoglobin 14.3 12.0 - 15.0 g/dL   HCT 42.0 36.0 - 46.0 %  I-Stat Troponin, ED (not at [facility])  Result Value Ref Range   Troponin i, poc 0.00 0.00 - 0.08 ng/mL   Comment 3            Imaging Review Ct Head Wo Contrast  09/02/2014   CLINICAL DATA:  Code stroke. Left-sided numbness, acute onset. Initial encounter.  EXAM: CT HEAD WITHOUT CONTRAST  TECHNIQUE: Contiguous axial images were obtained from the base of the skull through the vertex without intravenous contrast.  COMPARISON:  MRI of the brain performed 02/21/2011  FINDINGS: There is no evidence of acute infarction, mass lesion, or intra- or extra-axial hemorrhage on CT.  The posterior fossa, including the cerebellum, brainstem and fourth ventricle, is within normal limits. The third and lateral ventricles, and basal ganglia are unremarkable in appearance. The cerebral hemispheres are symmetric in appearance, with normal gray-white differentiation. No mass effect or midline shift  is seen.  There is no evidence of fracture; visualized osseous structures are unremarkable in appearance. The orbits are within normal limits. Mucus retention cysts or polyps are seen at the maxillary sinuses bilaterally. There is mild partial opacification of the left mastoid air cells. The remaining paranasal sinuses and right mastoid air cells are well-aerated. No significant soft tissue abnormalities are seen.  IMPRESSION: 1. No acute intracranial pathology seen on CT. 2. Mucus retention cysts or polyps at the maxillary sinuses bilaterally. Mild partial opacification of the left mastoid air cells.  These results were called by telephone at the time of interpretation on  09/02/2014 at 11:13 pm to Dr. Doy Mince, who verbally acknowledged these results.   Electronically Signed   By: Garald Balding M.D.   On: 09/02/2014 23:23   Mr Brain Wo Contrast  09/03/2014   CLINICAL DATA:  Headache, LEFT diplopia, numbness and tingling LEFT arm beginning at 8:30 p.m. History of LEFT breast cancer and cerebral aneurysm.  EXAM: MRI HEAD WITHOUT CONTRAST  TECHNIQUE: Multiplanar, multiecho pulse sequences of the brain and surrounding structures were obtained without intravenous contrast.  COMPARISON:  CT of the head September 02, 2014 at 2308 hours and MRA of the head February 21, 2011  FINDINGS: Mild to moderately motion degraded examination.  The ventricles and sulci are normal for patient's age. No abnormal parenchymal signal, mass lesions, mass effect. No reduced diffusion to suggest acute ischemia. No susceptibility artifact to suggest hemorrhage.  No abnormal extra-axial fluid collections. No extra-axial masses though, contrast enhanced sequences would be more sensitive. Normal major intracranial vascular flow voids seen at the skull base. Please note, patient's known extradural LEFT petrous internal carotid artery aneurysm would is difficult to appreciate by is MRI.  Ocular globes and orbital contents are unremarkable though not tailored for evaluation. No abnormal sellar expansion. Bilateral maxillary mucosal retention cyst without paranasal sinus air-fluid levels. The RIGHT mastoid air cells appear well-aerated, trace LEFT mastoid effusion. No suspicious calvarial bone marrow signal. No abnormal sellar expansion. Craniocervical junction maintained.  IMPRESSION: Mild to moderately motion degraded examination with apparent normal noncontrast MRI of the brain.   Electronically Signed   By: Elon Alas   On: 09/03/2014 01:33     EKG Interpretation   Date/Time:  Thursday September 02 2014 23:19:26 EDT Ventricular Rate:  60 PR Interval:  146 QRS Duration: 117 QT Interval:  480 QTC  Calculation: 480 R Axis:   2 Text Interpretation:  Sinus rhythm Nonspecific intraventricular conduction  delay When compared with ECG of 08/09/2011, No significant change was found  Confirmed by [facility]  MD, Jahir Halt ([id number]) on 09/02/2014 11:30:23 PM      MDM   Final diagnoses:  Diplopia  Headache, unspecified headache type  Elevated transaminase level    Headache and visual changes. Patient presented as a possible stroke however, I feel this is likely an ocular migraine. However, please note that monocular diplopia is very uncommon and usually actually a refractive her and not primary neurologic problem. Patient has been seen by neurology who have recommended MR scan. She was given IV fluids, metoclopramide, and diphenhydramine with excellent relief of headache. Following this, her vision actually was back to normal as well. MRI did not show evidence of a stroke. After discussion with neurologist, was felt the patient was safe for discharge home. She is instructed to follow-up with PCP for further outpatient testing.   I personally performed the services described in this documentation, which was scribed in my presence.  The recorded information has been reviewed and is accurate.       Delora Fuel, MD 29/29/09 0301

## 2014-09-03 ENCOUNTER — Telehealth: Payer: Self-pay | Admitting: Oncology

## 2014-09-03 ENCOUNTER — Emergency Department (HOSPITAL_COMMUNITY): Payer: 59

## 2014-09-03 LAB — ETHANOL: Alcohol, Ethyl (B): <5 mg/dL (ref 0–9)

## 2014-09-03 LAB — COMPREHENSIVE METABOLIC PANEL
ALBUMIN: 4.5 g/dL (ref 3.5–5.2)
ALK PHOS: 51 U/L (ref 39–117)
ALT: 44 U/L — ABNORMAL HIGH (ref 0–35)
ANION GAP: 12 (ref 5–15)
AST: 39 U/L — ABNORMAL HIGH (ref 0–37)
BUN: 21 mg/dL (ref 6–23)
CO2: 25 mmol/L (ref 19–32)
CREATININE: 0.81 mg/dL (ref 0.50–1.10)
Calcium: 10.2 mg/dL (ref 8.4–10.5)
Chloride: 105 mmol/L (ref 96–112)
GFR calc non Af Amer: 79 mL/min — ABNORMAL LOW (ref >90)
GLUCOSE: 91 mg/dL (ref 70–99)
POTASSIUM: 3.8 mmol/L (ref 3.5–5.1)
Sodium: 142 mmol/L (ref 135–145)
TOTAL PROTEIN: 6.9 g/dL (ref 6.0–8.3)
Total Bilirubin: 0.5 mg/dL (ref 0.3–1.2)

## 2014-09-03 LAB — PROTIME-INR
INR: 0.93 (ref 0.00–1.49)
PROTHROMBIN TIME: 12.6 s (ref 11.6–15.2)

## 2014-09-03 LAB — APTT: aPTT: 27 seconds (ref 24–37)

## 2014-09-03 MED ORDER — LORAZEPAM 2 MG/ML IJ SOLN
INTRAMUSCULAR | Status: AC
  Administered 2014-09-03: 1 mg
  Filled 2014-09-03: qty 1

## 2014-09-03 NOTE — ED Notes (Signed)
Patient verbalizes understanding of discharge instructions. Wheeled to lobby.

## 2014-09-03 NOTE — ED Notes (Signed)
MRI called patient unable to tolerate the MRI.  Dr Roxanne Mins aware.  Med taken to MRI.

## 2014-09-03 NOTE — ED Notes (Signed)
Patient states that the tingling in her hand has resolved after taking the ativan.

## 2014-09-03 NOTE — ED Notes (Signed)
Patient to MRI.

## 2014-09-03 NOTE — Discharge Instructions (Signed)
Your CT scan and MRI scan did not show any serious problem. Please follow up with you primary care provider for further evaluation as an outpatient.   Diplopia  Double vision (diplopia) means that you are seeing two of everything. Diplopia usually occurs with both eyes open, and may be worse when looking in one particular direction. If both eyes must be open to see double, this is called binocular diplopia. If double images are seen in just one eye, this is called monocular diplopia.  CAUSES  Binocular Diplopia  Disorder affecting the muscles that move the eyes or the nerves that control those muscles.  Tumor or other mass pushing on an eye from beside or behind the eye.  Myasthenia gravis. This is a neuromuscular illness that causes the body's muscles to tire easily. The eye muscles and eyelid muscles become weak. The eyes do not track together well.  Grave's disease. This is an overactivity of the thyroid gland. This condition causes swelling of tissues around the eyes. This produces a bulging out of the eyeball.  Blowout fracture of the bone around the eye. The muscles of the eye socket are damaged. This often happens when the eye is hit with force.  Complications after certain surgeries, such as a lens implant after cataract surgery.  Fluid-filled mass (abscess) behind or beside the eye, infection, or abnormal connection between arteries and veins. Sometimes, no cause is found. Monocular Diplopia  Problems with corrective lens or contacts.  Corneal abrasion, infection, severe injury, or bulging and irregularity of the corneal surface (keratoconus).  Irregularities of the pupil from drugs, severe injury, or other causes.  Problems involving the lens of the eye, such as opacities or cataracts.  Complications after certain surgeries, such as a lens implant after cataract surgery.  Retinal detachment or problems involving the blood vessels of the retina. Sometimes, no cause is  found. SYMPTOMS  Binocular Diplopia  When one eye is closed or covered, the double images disappear. Monocular Diplopia  When the unaffected eye is closed or covered, the double images remain. The double images disappear when the affected eye is closed or covered. DIAGNOSIS  A diagnosis is made during an eye exam. TREATMENT  Treatment depends on the cause or underlying disease.   Relief of double vision symptoms may be achieved by patching one eye or by using special glasses.  Surgery on the muscles of the eye may be needed. SEEK IMMEDIATE MEDICAL CARE IF:   You see two images of a single object you are looking at, either with both eyes open or with just one eye open. Document Released: 04/05/2004 Document Revised: 08/27/2011 Document Reviewed: 01/21/2008 [facility] Patient Information 2015 [place], [place]. This information is not intended to replace advice given to you by your health care provider. Make sure you discuss any questions you have with your health care provider.  Paresthesia Paresthesia is an abnormal burning or prickling sensation. This sensation is generally felt in the hands, arms, legs, or feet. However, it may occur in any part of the body. It is usually not painful. The feeling may be described as:  Tingling or numbness.  "Pins and needles."  Skin crawling.  Buzzing.  Limbs "falling asleep."  Itching. Most people experience temporary (transient) paresthesia at some time in their lives. CAUSES  Paresthesia may occur when you breathe too quickly (hyperventilation). It can also occur without any apparent cause. Commonly, paresthesia occurs when pressure is placed on a nerve. The feeling quickly goes away once the  pressure is removed. For some people, however, paresthesia is a long-lasting (chronic) condition caused by an underlying disorder. The underlying disorder may be:  A traumatic, direct injury to nerves. Examples include a:  Broken (fractured)  neck.  Fractured skull.  A disorder affecting the brain and spinal cord (central nervous system). Examples include:  Transverse myelitis.  Encephalitis.  Transient ischemic attack.  Multiple sclerosis.  Stroke.  Tumor or blood vessel problems, such as an arteriovenous malformation pressing against the brain or spinal cord.  A condition that damages the peripheral nerves (peripheral neuropathy). Peripheral nerves are not part of the brain and spinal cord. These conditions include:  Diabetes.  Peripheral vascular disease.  Nerve entrapment syndromes, such as carpal tunnel syndrome.  Shingles.  Hypothyroidism.  Vitamin B12 deficiencies.  Alcoholism.  Heavy metal poisoning (lead, arsenic).  Rheumatoid arthritis.  Systemic lupus erythematosus. DIAGNOSIS  Your caregiver will attempt to find the underlying cause of your paresthesia. Your caregiver may:  Take your medical history.  Perform a physical exam.  Order various lab tests.  Order imaging tests. TREATMENT  Treatment for paresthesia depends on the underlying cause. HOME CARE INSTRUCTIONS  Avoid drinking alcohol.  You may consider massage or acupuncture to help relieve your symptoms.  Keep all follow-up appointments as directed by your caregiver. SEEK IMMEDIATE MEDICAL CARE IF:   You feel weak.  You have trouble walking or moving.  You have problems with speech or vision.  You feel confused.  You cannot control your bladder or bowel movements.  You feel numbness after an injury.  You faint.  Your burning or prickling feeling gets worse when walking.  You have pain, cramps, or dizziness.  You develop a rash. MAKE SURE YOU:  Understand these instructions.  Will watch your condition.  Will get help right away if you are not doing well or get worse. Document Released: 05/25/2002 Document Revised: 08/27/2011 Document Reviewed: 02/23/2011 [facility] Patient Information 2015 [place], [place].  This information is not intended to replace advice given to you by your health care provider. Make sure you discuss any questions you have with your health care provider.  General Headache Without Cause A headache is pain or discomfort felt around the head or neck area. The specific cause of a headache may not be found. There are many causes and types of headaches. A few common ones are:  Tension headaches.  Migraine headaches.  Cluster headaches.  Chronic daily headaches. HOME CARE INSTRUCTIONS   Keep all follow-up appointments with your caregiver or any specialist referral.  Only take over-the-counter or prescription medicines for pain or discomfort as directed by your caregiver.  Lie down in a dark, quiet room when you have a headache.  Keep a headache journal to find out what may trigger your migraine headaches. For example, write down:  What you eat and drink.  How much sleep you get.  Any change to your diet or medicines.  Try massage or other relaxation techniques.  Put ice packs or heat on the head and neck. Use these 3 to 4 times per day for 15 to 20 minutes each time, or as needed.  Limit stress.  Sit up straight, and do not tense your muscles.  Quit smoking if you smoke.  Limit alcohol use.  Decrease the amount of caffeine you drink, or stop drinking caffeine.  Eat and sleep on a regular schedule.  Get 7 to 9 hours of sleep, or as recommended by your caregiver.  Keep lights dim  if bright lights bother you and make your headaches worse. SEEK MEDICAL CARE IF:   You have problems with the medicines you were prescribed.  Your medicines are not working.  You have a change from the usual headache.  You have nausea or vomiting. SEEK IMMEDIATE MEDICAL CARE IF:   Your headache becomes severe.  You have a fever.  You have a stiff neck.  You have loss of vision.  You have muscular weakness or loss of muscle control.  You start losing your balance or  have trouble walking.  You feel faint or pass out.  You have severe symptoms that are different from your first symptoms. MAKE SURE YOU:   Understand these instructions.  Will watch your condition.  Will get help right away if you are not doing well or get worse. Document Released: 06/04/2005 Document Revised: 08/27/2011 Document Reviewed: 06/20/2011 [facility] Patient Information 2015 [place], [place]. This information is not intended to replace advice given to you by your health care provider. Make sure you discuss any questions you have with your health care provider.

## 2014-09-03 NOTE — Telephone Encounter (Signed)
Lvm advising appt chg from 9/26 to 9/22 @230pm  with labs on 9/19. Also mailed appts.

## 2014-09-14 ENCOUNTER — Other Ambulatory Visit: Payer: Self-pay | Admitting: *Deleted

## 2014-09-14 DIAGNOSIS — C50412 Malignant neoplasm of upper-outer quadrant of left female breast: Secondary | ICD-10-CM

## 2014-09-14 MED ORDER — TAMOXIFEN CITRATE 20 MG PO TABS: 20.0000 mg | ORAL_TABLET | Freq: Every day | ORAL | Status: DC

## 2014-11-25 ENCOUNTER — Other Ambulatory Visit: Payer: Self-pay

## 2014-11-25 DIAGNOSIS — C50419 Malignant neoplasm of upper-outer quadrant of unspecified female breast: Secondary | ICD-10-CM

## 2014-11-25 MED ORDER — VENLAFAXINE HCL 37.5 MG PO TABS: 37.5000 mg | ORAL_TABLET | Freq: Two times a day (BID) | ORAL | Status: DC

## 2014-12-11 ENCOUNTER — Other Ambulatory Visit: Payer: Self-pay | Admitting: Oncology

## 2015-02-07 ENCOUNTER — Other Ambulatory Visit: Payer: Self-pay | Admitting: *Deleted

## 2015-02-07 DIAGNOSIS — C50412 Malignant neoplasm of upper-outer quadrant of left female breast: Secondary | ICD-10-CM

## 2015-02-07 DIAGNOSIS — C50419 Malignant neoplasm of upper-outer quadrant of unspecified female breast: Secondary | ICD-10-CM

## 2015-02-07 MED ORDER — VENLAFAXINE HCL 37.5 MG PO TABS: 37.5000 mg | ORAL_TABLET | Freq: Two times a day (BID) | ORAL | Status: DC

## 2015-02-07 MED ORDER — TAMOXIFEN CITRATE 20 MG PO TABS: 20.0000 mg | ORAL_TABLET | Freq: Every day | ORAL | Status: DC

## 2015-02-07 MED ORDER — CLONIDINE HCL 0.1 MG/24HR TD PTWK
MEDICATED_PATCH | TRANSDERMAL | Status: DC
Start: 2015-02-07 — End: 2015-03-10

## 2015-03-04 ENCOUNTER — Other Ambulatory Visit: Payer: Self-pay | Admitting: *Deleted

## 2015-03-04 DIAGNOSIS — C50412 Malignant neoplasm of upper-outer quadrant of left female breast: Secondary | ICD-10-CM

## 2015-03-07 ENCOUNTER — Other Ambulatory Visit (HOSPITAL_BASED_OUTPATIENT_CLINIC_OR_DEPARTMENT_OTHER): Payer: 59

## 2015-03-07 DIAGNOSIS — C50912 Malignant neoplasm of unspecified site of left female breast: Secondary | ICD-10-CM | POA: Diagnosis not present

## 2015-03-07 DIAGNOSIS — C50412 Malignant neoplasm of upper-outer quadrant of left female breast: Secondary | ICD-10-CM

## 2015-03-07 LAB — CBC WITH DIFFERENTIAL/PLATELET
BASO%: 0.5 % (ref 0.0–2.0)
BASOS ABS: 0 10*3/uL (ref 0.0–0.1)
EOS ABS: 0.1 10*3/uL (ref 0.0–0.5)
EOS%: 1.3 % (ref 0.0–7.0)
HCT: 41.6 % (ref 34.8–46.6)
HEMOGLOBIN: 14.6 g/dL (ref 11.6–15.9)
LYMPH%: 31.9 % (ref 14.0–49.7)
MCH: 32.4 pg (ref 25.1–34.0)
MCHC: 35.1 g/dL (ref 31.5–36.0)
MCV: 92.2 fL (ref 79.5–101.0)
MONO#: 0.4 10*3/uL (ref 0.1–0.9)
MONO%: 6.3 % (ref 0.0–14.0)
NEUT%: 60 % (ref 38.4–76.8)
NEUTROS ABS: 3.6 10*3/uL (ref 1.5–6.5)
PLATELETS: 198 10*3/uL (ref 145–400)
RBC: 4.51 10*6/uL (ref 3.70–5.45)
RDW: 12.2 % (ref 11.2–14.5)
WBC: 6.1 10*3/uL (ref 3.9–10.3)
lymph#: 1.9 10*3/uL (ref 0.9–3.3)

## 2015-03-07 LAB — COMPREHENSIVE METABOLIC PANEL (CC13)
ALK PHOS: 59 U/L (ref 40–150)
ALT: 40 U/L (ref 0–55)
ANION GAP: 7 meq/L (ref 3–11)
AST: 29 U/L (ref 5–34)
Albumin: 4.2 g/dL (ref 3.5–5.0)
BILIRUBIN TOTAL: 0.36 mg/dL (ref 0.20–1.20)
BUN: 16.3 mg/dL (ref 7.0–26.0)
CALCIUM: 9.5 mg/dL (ref 8.4–10.4)
CO2: 28 mEq/L (ref 22–29)
Chloride: 109 mEq/L (ref 98–109)
Creatinine: 0.8 mg/dL (ref 0.6–1.1)
EGFR: 79 mL/min/{1.73_m2} — AB (ref >90)
Glucose: 99 mg/dl (ref 70–140)
POTASSIUM: 4.9 meq/L (ref 3.5–5.1)
Sodium: 144 mEq/L (ref 136–145)
TOTAL PROTEIN: 6.9 g/dL (ref 6.4–8.3)

## 2015-03-10 ENCOUNTER — Telehealth: Payer: Self-pay | Admitting: General Practice

## 2015-03-10 ENCOUNTER — Telehealth: Payer: Self-pay | Admitting: Oncology

## 2015-03-10 ENCOUNTER — Ambulatory Visit (HOSPITAL_BASED_OUTPATIENT_CLINIC_OR_DEPARTMENT_OTHER): Payer: 59 | Admitting: Oncology

## 2015-03-10 VITALS — BP 107/72 | HR 57 | Temp 97.8°F | Resp 18 | Ht 67.0 in | Wt 179.5 lb

## 2015-03-10 DIAGNOSIS — C50412 Malignant neoplasm of upper-outer quadrant of left female breast: Secondary | ICD-10-CM | POA: Diagnosis not present

## 2015-03-10 DIAGNOSIS — C50419 Malignant neoplasm of upper-outer quadrant of unspecified female breast: Secondary | ICD-10-CM | POA: Diagnosis not present

## 2015-03-10 MED ORDER — TAMOXIFEN CITRATE 20 MG PO TABS: 20.0000 mg | ORAL_TABLET | Freq: Every day | ORAL | Status: DC

## 2015-03-10 MED ORDER — VENLAFAXINE HCL 37.5 MG PO TABS: 37.5000 mg | ORAL_TABLET | Freq: Two times a day (BID) | ORAL | Status: DC

## 2015-03-10 MED ORDER — CLONIDINE HCL 0.1 MG/24HR TD PTWK: MEDICATED_PATCH | TRANSDERMAL | Status: DC

## 2015-03-10 NOTE — Telephone Encounter (Signed)
Appointments made and calendars mailed

## 2015-03-10 NOTE — Progress Notes (Signed)
ID: Emily Knight OB: 59/11/1955  MR#: [id number]  CSN#:[id number]  PCP: LITTLE,KEVIN LORNE, MD GYN:   SU:  Matthew Wakefield, MD OTHER MD: Sarah Squire, MD   HISTORY OF PRESENT ILLNESS: The patient was recalled for additional evaluation when a screening mammogram showed an abnormality in the left breast.  A biopsy obtained on 07/24/2011 did confirm left invasive lobular carcinoma, grade 2, ER +20%, PR +10%, HER-2/neu negative, with MIB-1 is 1 of 30%.  A subsequent MRI obtained on 08/01/2011 confirmed a solitary enhancing mass with adjuvant satellite nodularity in the lateral aspect of the left breast, 2.2 x 1.4 x 2.5 cm. There were no enlarged axillary or internal mammary lymph nodes. No abnormal enhancement was seen in the right breast.  Patient subsequently underwent a left lumpectomy with sentinel node dissection under the care of Dr. Wakefield on 08/14/2011 revealing a 2.2 cm primary lesion, 2 of 3 sentinel lymph nodes involved with extracapsular extension. She then underwent reexcision of left axillary lymph nodes on 08/28/2011 showing 0 of 4 additional nodes involved.  Details regarding treatment history is noted below.   INTERVAL HISTORY: Genesis returns today for follow-up of her estrogen receptor positive breast cancer. She continues on tamoxifen with good tolerance in general but with continuing problems with hot flashes. Aside from that she is feeling "wonderful". She is very active with fishing for cancer and they are getting ready for their yearly trip to [place]. They raised $54,000 this year of which at 10,001 to "friends", 10,004 mammography scholarship, 10,004 "little pink house" in [place], and so on.  REVIEW OF SYSTEMS: Emily Knight is not exercising regularly but she is always on her feet. She gets about 10,000 steps a day on her fifth visit. She has some joint pains here in there which are not more persistent or intense than usual. She still has problems with hot flashes. She  tells me she tried to increase the venlafaxine to 75 mg but it made her "not care about anything". She is working with [employer]. Aside from these issues a detailed review of systems today was noncontributory   PAST MEDICAL HISTORY: Past Medical History  Diagnosis Date  . Allergy   . Arthritis     left knee  . Stress fracture     left foot/ankle  . Hot flushes, perimenopausal   . Cancer     left breast  . Breast cancer 08/14/11 [facility]    Left breast lumpectomy/left axillary lymph node resection,invasive Grade II Lobular CA in Situ,,left lymph node bx==positive for metastatic lobular ca(2/2)  . Hot flashes   . Anxiety   . History of chemotherapy final 02/05/12    4 cycles Adriamycin/Cytoxan and 12 Weeks of Taxol  . S/P radiation therapy 02/12/12 -03/26/12    Left Breast, Left Axilla and Left Supraclivcular Region  . Use of tamoxifen (Nolvadex) Start - 04/10/12  . 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 Cerebral aneurysm     dx by cerebral angiogram,encased in bone    PAST SURGICAL HISTORY: Past Surgical History  Procedure Laterality Date  . Left knee reconstruction      six surgeries (3 orthoscopic) 3 open, ACL  . Tonsillectomy and adenoidectomy  1971  . Partial hysterectomy  1984? 1989?     for fibroids   . Trigger finger release  06/29/2008    release A-1 pulley and exc. cyst left thumb  . Breast lumpectomy  08/14/11      Left breast lumpectomy, left axillary sentinel node biopsy  . Abdominal hysterectomy  1980's    ovaries intact  . Axillary lymph node dissection  08/28/2011    Procedure: AXILLARY LYMPH NODE DISSECTION;  Surgeon: Matthew Wakefield, MD;  Location: [facility];  Service: General;  Laterality: Left;  Left axillary node dissection, port placement  . Portacath placement  08/28/2011    Procedure: INSERTION PORT-A-CATH;  Surgeon: Matthew Wakefield, MD;  Location: [facility];  Service: General;   Laterality: Right;  . Port-a-cath removal  05/29/2012    Procedure: REMOVAL PORT-A-CATH;  Surgeon: Matthew Wakefield, MD;  Location: [facility];  Service: General;  Laterality: N/A;    FAMILY HISTORY Family History  Problem Relation Age of Onset  . Anesthesia problems Mother     post-op N/V  . Cancer Father 56    lung - ages 56 and 86; prostate  . Cancer Maternal Aunt 58    ovarian  . Cancer Paternal Aunt     lung  . Cancer Paternal Uncle     bone cancer  . Melanoma Cousin 56    female ,skin   the patient's father died at the age of 87 from metastatic lung cancer. He had a remote history of smoking. The patient's mother is living at age 85. The patient has 2 brothers, no sisters. There is no other history of breast cancer in the family. She did have a maternal aunt with ovarian cancer. The patient has been tested for the BRCA gene and is negative. In  GYNECOLOGIC HISTORY:  Menarche age 11. She is GX P0. She had a simple hysterectomy, no salpingo-oophorectomy at age 30. She never took hormone replacement.  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 She is now retired from that and works part-time at the [employer]. At home she lives with Debbie Britt and they recently got married. They also have 2 dogs.    ADVANCED DIRECTIVES: In place. Debbie is [name]'s health care power of attorney   HEALTH MAINTENANCE: Social History  Substance Use Topics  . Smoking status: Former Smoker -- 1.00 packs/day for 15 years    Types: Cigarettes    Quit date: 01/23/2001  . Smokeless tobacco: Never Used     Comment: quit smoking 10 yrs. ago  . Alcohol Use: Yes     Comment: occasionally     Colonoscopy: Not on file  PAP: /status post hysterectomy  Bone density: 02/09/2014 at [facility], osteopenia with a T score of - 2.0 (compared with1.4 - two years prior)   Lipid panel: Not on  file/Dr. Little   Allergies  Allergen Reactions  . Adhesive [Tape] Other (See Comments)    BLISTERS  . Codeine Nausea And Vomiting    Current Outpatient Prescriptions  Medication Sig Dispense Refill  . CELEBREX 200 MG capsule     . cholecalciferol (VITAMIN D) 1000 UNITS tablet Take 1,000 Units by mouth daily.    . cloNIDine (CATAPRES - DOSED IN MG/24 HR) 0.1 mg/24hr patch UNWRAP AND APPLY 1 PATCH TO CLEAN, DRY AND INTACT SKIN AND CHANGE ONCE WEEKLY 12 patch 0  . tamoxifen (NOLVADEX) 20 MG tablet Take 1 tablet (20 mg total) by mouth daily. 90 tablet 3  . venlafaxine (EFFEXOR) 37.5 MG tablet Take 1 tablet (37.5 mg total) by mouth 2 (two) times daily with a meal. 60 tablet 3     No current facility-administered medications for this visit.    OBJECTIVE: Middle-aged woman who appears well Filed Vitals:   03/10/15 1438  BP: 107/72  Pulse: 57  Temp: 97.8 F (36.6 C)  Resp: 18     Body mass index is 28.11 kg/(m^2).    ECOG FS: 0 Filed Weights   03/10/15 1438  Weight: 179 lb 8 oz (81.421 kg)   Sclerae unicteric, EOMs intact Oropharynx clear, dentition in good repair No cervical or supraclavicular adenopathy Lungs no rales or rhonchi Heart regular rate and rhythm Abd soft, nontender, positive bowel sounds MSK no focal spinal tenderness, no upper extremity lymphedema Neuro: nonfocal, well oriented, appropriate affect Breasts: The right breast is benign. The left breast is status post lumpectomy and radiation. The cosmetic result is good. There is no evidence of local recurrence per the right axilla is benign.    LAB RESULTS:   Lab Results  Component Value Date   WBC 6.1 03/07/2015   NEUTROABS 3.6 03/07/2015   HGB 14.6 03/07/2015   HCT 41.6 03/07/2015   MCV 92.2 03/07/2015   PLT 198 03/07/2015      Chemistry      Component Value Date/Time   NA 144 03/07/2015 0946   NA 142 09/02/2014 2307   K 4.9 03/07/2015 0946   K 3.8 09/02/2014 2307   CL 105 09/02/2014 2307   CL  104 12/09/2012 0837   CO2 28 03/07/2015 0946   CO2 25 09/02/2014 2303   BUN 16.3 03/07/2015 0946   BUN 24* 09/02/2014 2307   CREATININE 0.8 03/07/2015 0946   CREATININE 0.80 09/02/2014 2307      Component Value Date/Time   CALCIUM 9.5 03/07/2015 0946   CALCIUM 10.2 09/02/2014 2303   ALKPHOS 59 03/07/2015 0946   ALKPHOS 51 09/02/2014 2303   AST 29 03/07/2015 0946   AST 39* 09/02/2014 2303   ALT 40 03/07/2015 0946   ALT 44* 09/02/2014 2303   BILITOT 0.36 03/07/2015 0946   BILITOT 0.5 09/02/2014 2303      STUDIES: Mammography at [facility] 08/17/2014 with tomosynthesis showed the breast density to be category B. There was no findings suggestive of malignancy. ASSESSMENT: 59 y.o. BRCA negative [place] woman  (1)  Status post LEFT lumpectomy with sentinel node dissection on 08/14/2011 for a pT2 pN1, stage IIB invasive lobular breast cancer,  ER 20%, PR 10%, HER2 negative, Ki-67 30%.  Re-excision of left axillary lymph nodes on 08/28/2011 showed 0 of 4 nodes involved.  (2)  Enrolled in the NSABP B-49  Protocol. Received 4 cycles of adjuvant dose dense doxorubicin/ cyclophosphamide , followed by 12 weekly cycles of paclitaxel, completed 02/05/2012.   (3)  radiation therapy completed 03/26/2012   (4) began tamoxifen October 2013.   (5)  hot flashes associated with tamoxifen, currently on Effexor 75 mg daily and Catapres-TTS-1 patches  (6) osteopenia,  bone density 02/09/2014 showed a T score - 2.0  (a) zolendronate started 03/11/2014, to be repeated yearly.  (7) status post simple hysterectomy, and no salpingo-oophorectomy   (8) hepatic steatosis      PLAN: Tiawanna is now 3-1/2 years out from her definitive surgery with no evidence of disease recurrence. This is very favorable.  She is tolerating tamoxifen well except for the hot flashes. She does not want to increase the venlafaxine. Today I renewed that as well as her TTS 1 and tamoxifen scripts. The plan will be to continue  tamoxifen for a total of 10 years.  She will   be due for repeat bone density August of next year. I have written for zolendronate second dose November 3. Possibly that we'll be her last dose depending on the results of her bone density when we see her again a year from now.  She knows to call for any other problems that may develop before the next visit here.  Chauncey Cruel, MD   03/10/2015 2:39 PM

## 2015-03-14 ENCOUNTER — Ambulatory Visit: Payer: 59 | Admitting: Oncology

## 2015-03-21 ENCOUNTER — Ambulatory Visit: Payer: 59

## 2015-04-21 ENCOUNTER — Ambulatory Visit (HOSPITAL_BASED_OUTPATIENT_CLINIC_OR_DEPARTMENT_OTHER): Payer: 59

## 2015-04-21 VITALS — BP 159/83 | HR 69 | Temp 97.3°F | Resp 16

## 2015-04-21 DIAGNOSIS — C50412 Malignant neoplasm of upper-outer quadrant of left female breast: Secondary | ICD-10-CM

## 2015-04-21 DIAGNOSIS — M8580 Other specified disorders of bone density and structure, unspecified site: Secondary | ICD-10-CM | POA: Diagnosis not present

## 2015-04-21 MED ORDER — SODIUM CHLORIDE 0.9 % IV SOLN
Freq: Once | INTRAVENOUS | Status: AC
  Administered 2015-04-21: 15:00:00 via INTRAVENOUS

## 2015-04-21 MED ORDER — ZOLEDRONIC ACID 4 MG/100ML IV SOLN
4.0000 mg | Freq: Once | INTRAVENOUS | Status: AC
  Administered 2015-04-21: 4 mg via INTRAVENOUS
  Filled 2015-04-21: qty 100

## 2015-04-21 NOTE — Patient Instructions (Signed)

## 2015-06-06 ENCOUNTER — Other Ambulatory Visit: Payer: Self-pay | Admitting: Oncology

## 2015-10-11 ENCOUNTER — Encounter: Payer: Self-pay | Admitting: Oncology

## 2016-03-11 NOTE — Progress Notes (Signed)
ID: Emily Knight OB: 1955-06-23  MR#: [id number]  CSN#:[id number]  PCP: Gennette Pac, MD GYN:   SU:  Rolm Bookbinder, MD OTHER MD: Eppie Gibson, MD   HISTORY OF PRESENT ILLNESS: The patient was recalled for additional evaluation when a screening mammogram showed an abnormality in the left breast.  A biopsy obtained on 07/24/2011 did confirm left invasive lobular carcinoma, grade 2, ER +20%, PR +10%, HER-2/neu negative, with MIB-1 is 1 of 30%.  A subsequent MRI obtained on 08/01/2011 confirmed a solitary enhancing mass with adjuvant satellite nodularity in the lateral aspect of the left breast, 2.2 x 1.4 x 2.5 cm. There were no enlarged axillary or internal mammary lymph nodes. No abnormal enhancement was seen in the right breast.  Patient subsequently underwent a left lumpectomy with sentinel node dissection under the care of Dr. Donne Hazel on 08/14/2011 revealing a 2.2 cm primary lesion, 2 of 3 sentinel lymph nodes involved with extracapsular extension. She then underwent reexcision of left axillary lymph nodes on 08/28/2011 showing 0 of 4 additional nodes involved.  Details regarding treatment history is noted below.   INTERVAL HISTORY: Emily Knight returns today for follow-up of her breast cancer. She tolerates tamoxifen well, with some hot flashes as her only side effect. She takes venlafaxine for this.  REVIEW OF SYSTEMS: Emily Knight is very active with numerous activities and also has started a [employer]. She does have aches and pains here and there which she thinks might be from tamoxifen although they are more likely to be related a detailed review of systems today was otherwise stable.   PAST MEDICAL HISTORY: Past Medical History:  Diagnosis Date  . Allergy   . Anxiety   . Arthritis    left knee  . Breast cancer 08/14/11 [facility]   Left breast lumpectomy/left axillary lymph node resection,invasive Grade II Lobular CA in Situ,,left lymph node bx==positive for metastatic lobular  ca(2/2)  . Cancer    left breast  . Carotid artery aneurysm    left internal carotid artery; last MRI 02/22/2011  . Cerebral aneurysm    dx by cerebral angiogram,encased in bone  . History of chemotherapy final 02/05/12   4 cycles Adriamycin/Cytoxan and 12 Weeks of Taxol  . Hot flashes   . Hot flushes, perimenopausal   . S/P radiation therapy 02/12/12 -03/26/12   Left Breast, Left Axilla and Left Supraclivcular Region  . Stress fracture    left foot/ankle  . Use of tamoxifen (Nolvadex) Start - 04/10/12    PAST SURGICAL HISTORY: Past Surgical History:  Procedure Laterality Date  . ABDOMINAL HYSTERECTOMY  1980's   ovaries intact  . AXILLARY LYMPH NODE DISSECTION  08/28/2011   Procedure: AXILLARY LYMPH NODE DISSECTION;  Surgeon: Rolm Bookbinder, MD;  Location: [facility];  Service: General;  Laterality: Left;  Left axillary node dissection, port placement  . BREAST LUMPECTOMY  08/14/11   Left breast lumpectomy, left axillary sentinel node biopsy  . left knee reconstruction     six surgeries (3 orthoscopic) 3 open, ACL  . PARTIAL HYSTERECTOMY  1984? 1989?    for fibroids   . PORT-A-CATH REMOVAL  05/29/2012   Procedure: REMOVAL PORT-A-CATH;  Surgeon: Rolm Bookbinder, MD;  Location: [facility];  Service: General;  Laterality: N/A;  . PORTACATH PLACEMENT  08/28/2011   Procedure: INSERTION PORT-A-CATH;  Surgeon: Rolm Bookbinder, MD;  Location: [facility];  Service: General;  Laterality: Right;  . TONSILLECTOMY AND ADENOIDECTOMY  1971  . TRIGGER FINGER  RELEASE  06/29/2008   release A-1 pulley and exc. cyst left thumb    FAMILY HISTORY Family History  Problem Relation Age of Onset  . Anesthesia problems Mother     post-op N/V  . Cancer Father 26    lung - ages 24 and 72; prostate  . Cancer Maternal Aunt 58    ovarian  . Cancer Paternal Aunt     lung  . Cancer Paternal Uncle     bone cancer  . Melanoma Cousin 84    female ,skin    the patient's father died at the age of 39 from metastatic lung cancer. He had a remote history of smoking. The patient's mother is living at age 35. The patient has 2 brothers, no sisters. There is no other history of breast cancer in the family. She did have a maternal aunt with ovarian cancer. The patient has been tested for the BRCA gene and is negative. In  GYNECOLOGIC HISTORY:  Menarche age 36. She is GX P0. She had a simple hysterectomy, no salpingo-oophorectomy at age 6. She never took hormone replacement.  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 At home she lives with Emily Knight and they recently got married. They also have 2 dogs.    ADVANCED DIRECTIVES: In place. Emily Knight is Emily Knight's health care power of attorney   HEALTH MAINTENANCE: Social History  Substance Use Topics  . Smoking status: Former Smoker    Packs/day: 1.00    Years: 15.00    Types: Cigarettes    Quit date: 01/23/2001  . Smokeless tobacco: Never Used     Comment: quit smoking 10 yrs. ago  . Alcohol use Yes     Comment: occasionally     Colonoscopy: Not on file  PAP: /status post hysterectomy  Bone density: 02/09/2014 at [facility], osteopenia with a T score of - 2.0 (compared with1.4 - two years prior)   Lipid panel: Not on file/Dr. Little   Allergies  Allergen Reactions  . Adhesive [Tape] Other (See Comments)    BLISTERS  . Codeine Nausea And Vomiting    Current Outpatient Prescriptions  Medication Sig Dispense Refill  . CELEBREX 200 MG capsule     . cholecalciferol (VITAMIN D) 1000 UNITS tablet Take 1,000 Units by mouth daily.    . cloNIDine (CATAPRES - DOSED IN MG/24 HR) 0.1 mg/24hr patch UNWRAP AND APPLY 1 PATCH TO CLEAN, DRY AND INTACT SKIN AND CHANGE ONCE WEEKLY 12 patch 12  . tamoxifen (NOLVADEX) 20 MG tablet Take 1 tablet (20  mg total) by mouth daily. 90 tablet 3  . venlafaxine (EFFEXOR) 37.5 MG tablet Take 1 tablet by mouth two  times daily with a meal 90 tablet 3   No current facility-administered medications for this visit.     OBJECTIVE: Middle-aged woman In no acute distress Vitals:   03/12/16 1443  BP: 140/87  Pulse: (!) 57  Resp: 18  Temp: 98.4 F (36.9 C)     Body mass index is 27.52 kg/m.    ECOG FS: 0 Filed Weights   03/12/16 1443  Weight: 175 lb 11.2 oz (79.7 kg)   Sclerae unicteric, pupils round and equal Oropharynx clear and moist-- no thrush or other lesions No cervical or supraclavicular adenopathy Lungs no rales or rhonchi Heart regular rate and rhythm Abd soft,  nontender, positive bowel sounds MSK no focal spinal tenderness, no upper extremity lymphedema Neuro: nonfocal, well oriented, appropriate affect Breasts: The right breast shows no suspicious findings. The left breast is status post lumpectomy and radiation. There is no evidence of local recurrence. The left axilla is benign.   LAB RESULTS:   Lab Results  Component Value Date   WBC 6.1 03/07/2015   NEUTROABS 3.6 03/07/2015   HGB 14.6 03/07/2015   HCT 41.6 03/07/2015   MCV 92.2 03/07/2015   PLT 198 03/07/2015      Chemistry      Component Value Date/Time   NA 144 03/07/2015 0946   K 4.9 03/07/2015 0946   CL 105 09/02/2014 2307   CL 104 12/09/2012 0837   CO2 28 03/07/2015 0946   BUN 16.3 03/07/2015 0946   CREATININE 0.8 03/07/2015 0946      Component Value Date/Time   CALCIUM 9.5 03/07/2015 0946   ALKPHOS 59 03/07/2015 0946   AST 29 03/07/2015 0946   ALT 40 03/07/2015 0946   BILITOT 0.36 03/07/2015 0946      STUDIES: Mammography at [facility] 09/08/2015 Showed  the breast density to be category B8. There was no evidence of disease recurrence.   ASSESSMENT: 60 y.o. BRCA negative [place] woman  (1)  Status post LEFT upper outer quadrant lumpectomy with sentinel node dissection on 08/14/2011 for a pT2  pN1, stage IIB invasive lobular breast cancer,  ER 20%, PR 10%, HER2 negative, Ki-67 30%.  Re-excision of left axillary lymph nodes on 08/28/2011 showed 0 of 4 nodes involved.  (2)  Enrolled in the NSABP B-49  Protocol. Received 4 cycles of adjuvant dose dense doxorubicin/ cyclophosphamide , followed by 12 weekly cycles of paclitaxel, completed 02/05/2012.   (3)  radiation therapy completed 03/26/2012   (4) began tamoxifen October 2013.   (5)  hot flashes associated with tamoxifen, currently on Effexor 75 mg daily and Catapres-TTS-1 patches  (6) osteopenia,  bone density 02/09/2014 showed a T score - 2.0  (a) zolendronate started 03/11/2014, to be repeated yearly.  (7) status post simple hysterectomy, and no salpingo-oophorectomy   (8) hepatic steatosis      PLAN: Sharalyn is now 4-1/2 years out from definitive surgery for her breast cancer with no evidence of disease recurrence. This is very favorable.  She is 4 years into tamoxifen. We discussed that the benefits of going to 10 years on this drug are measurable but small. She is very comfortable with the idea of stopping with one more year  Accordingly her next visit a year from now will be "graduation" visit.  I think she would benefit from participating in RTOG and yoga classes. I gave her the information regarding that.  She tells me last year they raised more than $70,000 through her beach programs and she is heading for the beach today hoping to make even more this year. All of that goes for breast cancer.  She knows to call for any problems that may develop her next visit here. Chauncey Cruel, MD   03/12/2016 2:46 PM

## 2016-03-12 ENCOUNTER — Ambulatory Visit (HOSPITAL_BASED_OUTPATIENT_CLINIC_OR_DEPARTMENT_OTHER): Payer: 59 | Admitting: Oncology

## 2016-03-12 VITALS — BP 140/87 | HR 57 | Temp 98.4°F | Resp 18 | Ht 67.0 in | Wt 175.7 lb

## 2016-03-12 DIAGNOSIS — C50412 Malignant neoplasm of upper-outer quadrant of left female breast: Secondary | ICD-10-CM | POA: Diagnosis not present

## 2016-03-12 DIAGNOSIS — K760 Fatty (change of) liver, not elsewhere classified: Secondary | ICD-10-CM | POA: Diagnosis not present

## 2016-03-12 DIAGNOSIS — N951 Menopausal and female climacteric states: Secondary | ICD-10-CM | POA: Diagnosis not present

## 2016-03-12 DIAGNOSIS — M8580 Other specified disorders of bone density and structure, unspecified site: Secondary | ICD-10-CM

## 2016-03-17 IMAGING — CT CT CHEST W/ CM
2 of 4 series · 15 of 36 positions shown, 18 images · IV contrast (OMNIPAQUE)
Comparison: PET-CT 02/23/2014, PET-CT 09/04/2011

CLINICAL DATA: Restaging breast cancer.  Left lumpectomy

EXAM:
CT CHEST WITH CONTRAST
TECHNIQUE: Multidetector CT imaging of the chest was performed during
intravenous contrast administration.
CONTRAST:  80mL OMNIPAQUE IOHEXOL 300 MG/ML  SOL[facility]

[Series 2: chest with st · axial · 0.74mm/px · z∈[-416,-166]mm · 12 of 60 slices shown, 15 images]
[im 5/60  mediastinal]
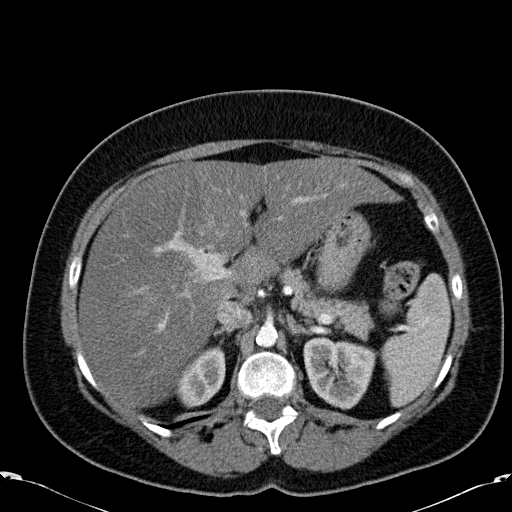
[im 5/60  lung]
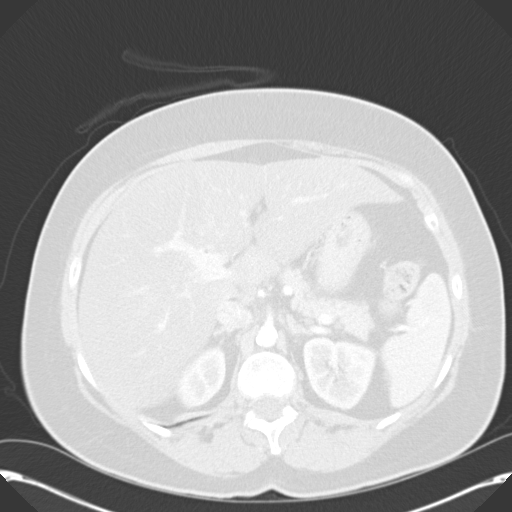
[im 10/60  lung]
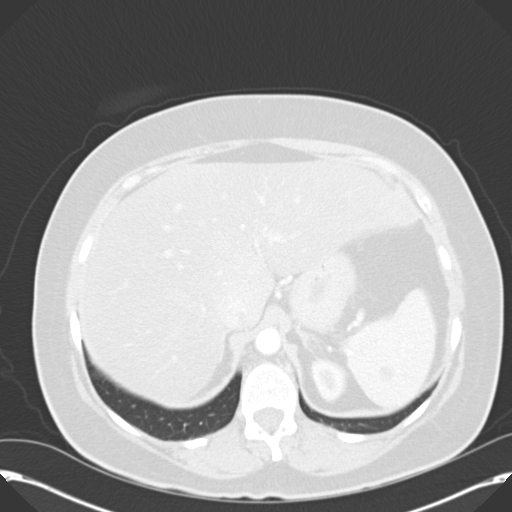
[im 14/60  lung]
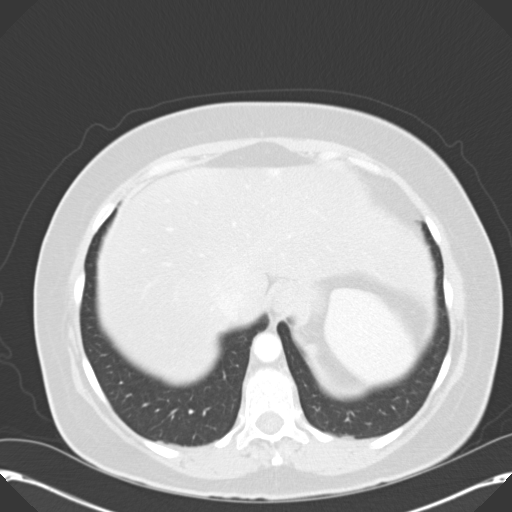
[im 19/60  lung]
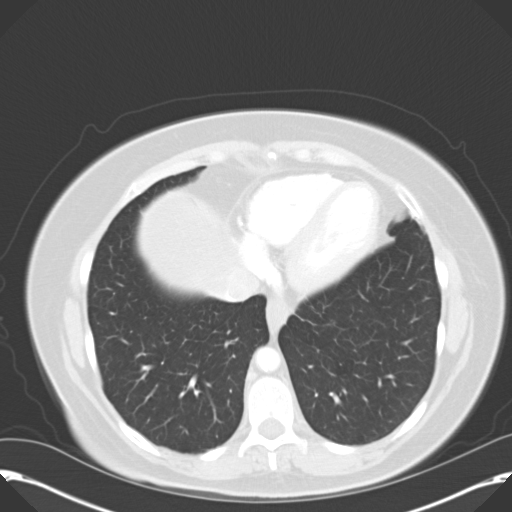
[im 23/60  mediastinal]
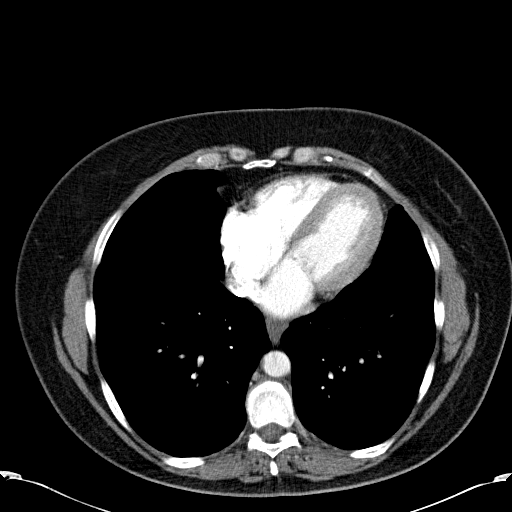
[im 23/60  lung]
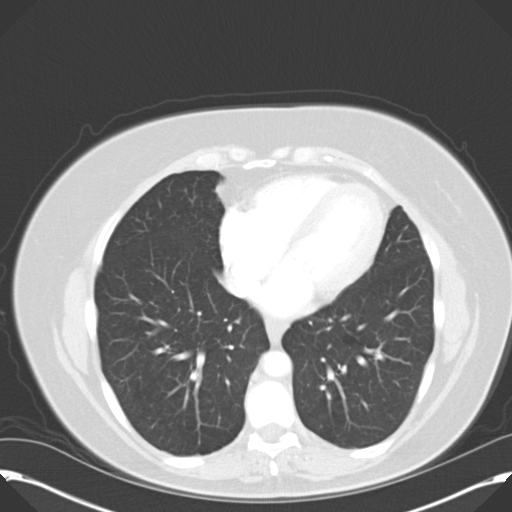
[im 28/60  lung]
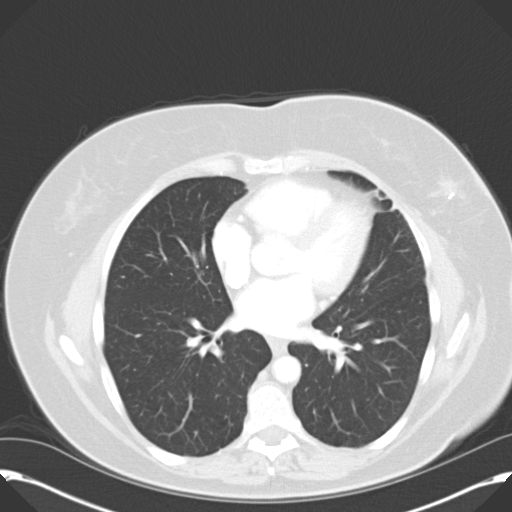
[im 32/60  lung]
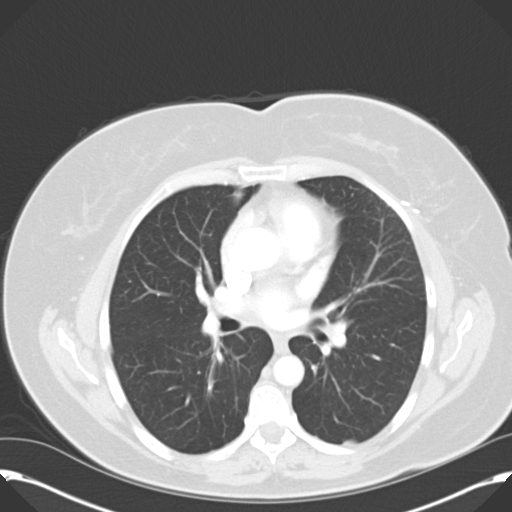
[im 37/60  lung]
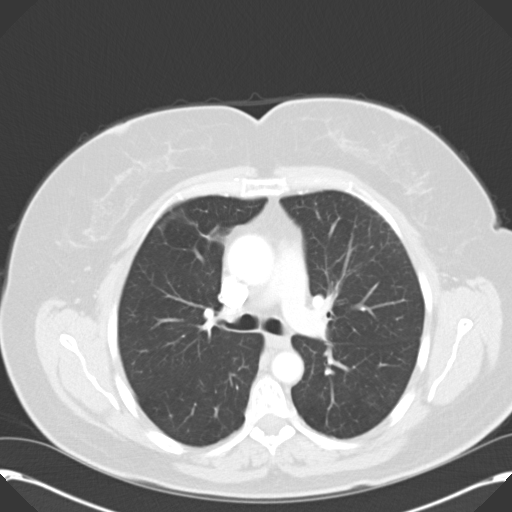
[im 41/60  mediastinal]
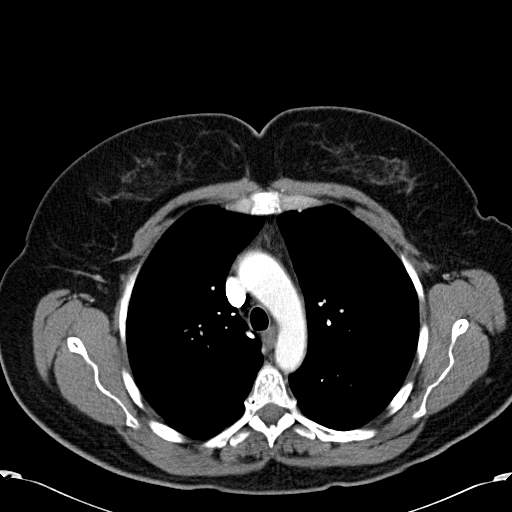
[im 41/60  lung]
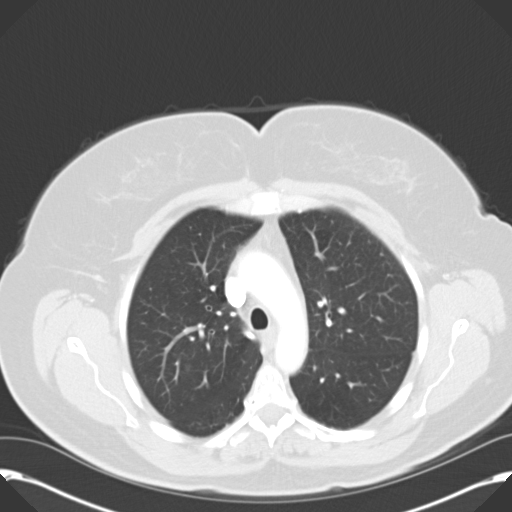
[im 46/60  lung]
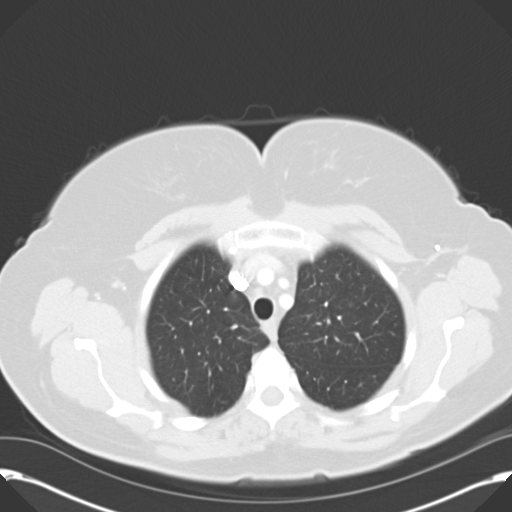
[im 50/60  lung]
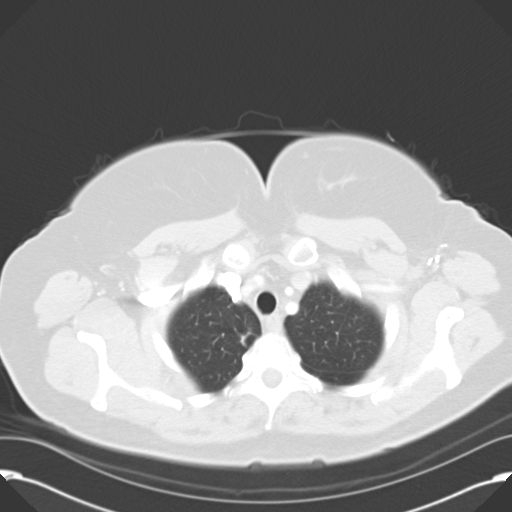
[im 55/60  lung]
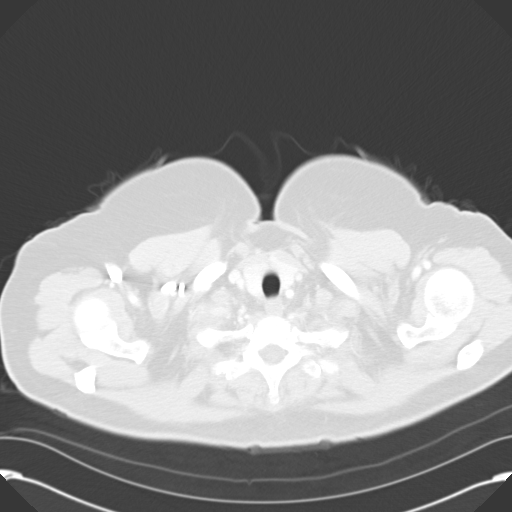

[Series 602: <mpr thick range> · coronal · 0.74mm/px · 3 of 93 slices shown]
[im 19/93  lung]
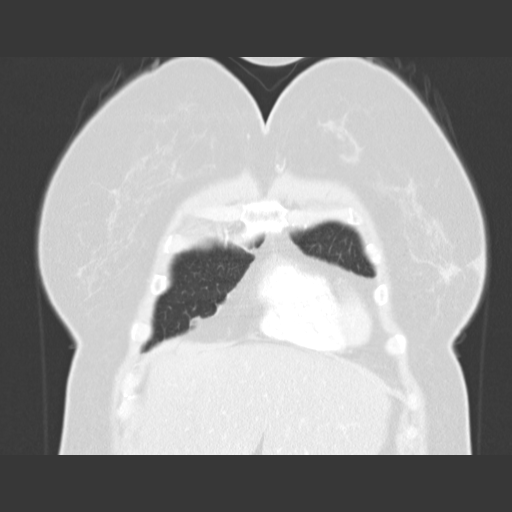
[im 37/93  lung]
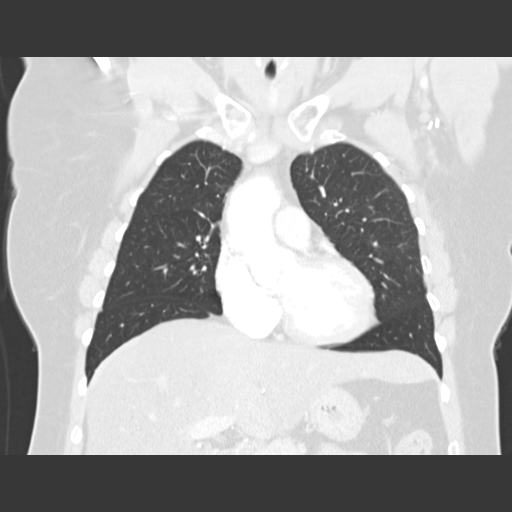
[im 56/93  lung]
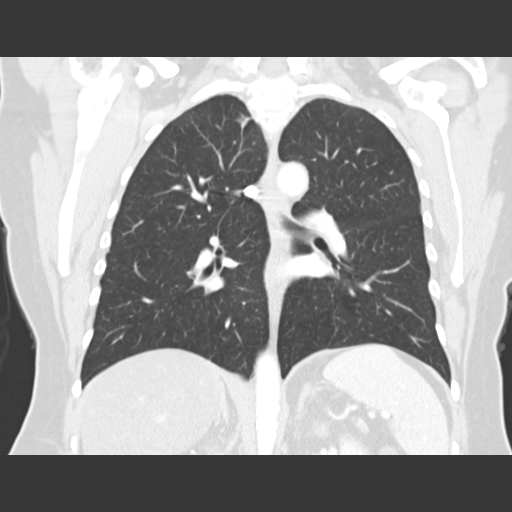

[15 of 36 positions shown; findings below may reference images not displayed]

FINDINGS: Postsurgical change in the left breast consistent with lumpectomy
and axillary dissection. No suspicious nodularity evident. No
supraclavicular or infraclavicular adenopathy. No internal mammary
adenopathy. No mediastinal lymphadenopathy. No pericardial fluid.
Esophagus is normal. No central pulmonary embolism.

Review of the lung parenchyma demonstrates a small 3 mm left upper
lobe pulmonary nodule (image 24) not changed from prior. No new or
suspicious nodules. Airways are normal.

Limited view of the upper abdomen demonstrates a low-density lesion
within the spleen which is not hypermetabolic on comparison PET-CT
scan (16 mm on image number 50). Adrenal glands are normal.

There is diffuse low-attenuation of the liver consistent with
hepatic steatosis. Within the subcapsular right hepatic lobe is a 9
mm hyperdense focus (image 53, series). No hypermetabolic activity
associated with this lesion on comparison PET-CT scan. A second
hypervascular lesions in the anterior aspect of the left hepatic
lobe measuring 9 mm (image 47, series 2. This left hepatic lobe
lesion also does not have associated metabolic activity.

Limited view of the skeleton demonstrates no aggressive osseous
lesion.
IMPRESSION: 1. No evidence of local breast cancer recurrence or thoracic
metastasis.
2. Hyperdense foci in the subcapsular right hepatic lobe and left
hepatic lobe likely represent benign vascular structures or
hemangiomas. No associated metabolic activity associated lesions on
comparison PET-CT scan. If patient has elevated tumor marker,
consider abdominal MRI with contrast for further evaluation.
Otherwise recommend attention on routine follow-up.
3. Hepatic steatosis.
4. Splenic lesion without metabolic and is likely benign cyst or
hemangioma.

## 2016-04-04 ENCOUNTER — Other Ambulatory Visit: Payer: Self-pay | Admitting: Oncology

## 2016-04-04 DIAGNOSIS — C50412 Malignant neoplasm of upper-outer quadrant of left female breast: Secondary | ICD-10-CM

## 2016-04-24 ENCOUNTER — Other Ambulatory Visit: Payer: Self-pay | Admitting: Oncology

## 2016-07-18 DIAGNOSIS — M545 Low back pain: Secondary | ICD-10-CM | POA: Diagnosis not present

## 2016-07-18 DIAGNOSIS — M799 Soft tissue disorder, unspecified: Secondary | ICD-10-CM | POA: Diagnosis not present

## 2016-07-19 ENCOUNTER — Other Ambulatory Visit: Payer: Self-pay | Admitting: Family Medicine

## 2016-07-19 DIAGNOSIS — D179 Benign lipomatous neoplasm, unspecified: Secondary | ICD-10-CM | POA: Diagnosis not present

## 2016-07-19 DIAGNOSIS — M7989 Other specified soft tissue disorders: Secondary | ICD-10-CM

## 2016-07-28 ENCOUNTER — Ambulatory Visit
Admission: RE | Admit: 2016-07-28 | Discharge: 2016-07-28 | Disposition: A | Discharge status: Home / Self Care | Payer: 59 | Source: Ambulatory Visit | Attending: Family Medicine | Admitting: Family Medicine

## 2016-07-28 DIAGNOSIS — M5126 Other intervertebral disc displacement, lumbar region: Secondary | ICD-10-CM | POA: Diagnosis not present

## 2016-07-28 DIAGNOSIS — M48061 Spinal stenosis, lumbar region without neurogenic claudication: Secondary | ICD-10-CM | POA: Diagnosis not present

## 2016-07-28 DIAGNOSIS — I671 Cerebral aneurysm, nonruptured: Secondary | ICD-10-CM

## 2016-07-28 DIAGNOSIS — M7989 Other specified soft tissue disorders: Secondary | ICD-10-CM

## 2016-07-28 MED ORDER — GADOBENATE DIMEGLUMINE 529 MG/ML IV SOLN
16.0000 mL | Freq: Once | INTRAVENOUS | Status: AC | PRN
  Administered 2016-07-28: 16 mL via INTRAVENOUS

## 2016-08-03 ENCOUNTER — Other Ambulatory Visit: Payer: Self-pay | Admitting: Family Medicine

## 2016-08-03 DIAGNOSIS — N281 Cyst of kidney, acquired: Secondary | ICD-10-CM

## 2016-08-07 ENCOUNTER — Ambulatory Visit
Admission: RE | Admit: 2016-08-07 | Discharge: 2016-08-07 | Disposition: A | Discharge status: Home / Self Care | Payer: 59 | Source: Ambulatory Visit | Attending: Family Medicine | Admitting: Family Medicine

## 2016-08-07 DIAGNOSIS — N281 Cyst of kidney, acquired: Secondary | ICD-10-CM

## 2016-08-07 DIAGNOSIS — N2889 Other specified disorders of kidney and ureter: Secondary | ICD-10-CM | POA: Diagnosis not present

## 2016-08-07 MED ORDER — IOPAMIDOL (ISOVUE-300) INJECTION 61%
100.0000 mL | Freq: Once | INTRAVENOUS | Status: AC | PRN
  Administered 2016-08-07: 100 mL via INTRAVENOUS

## 2016-08-28 DIAGNOSIS — M1711 Unilateral primary osteoarthritis, right knee: Secondary | ICD-10-CM | POA: Diagnosis not present

## 2016-08-28 DIAGNOSIS — M11261 Other chondrocalcinosis, right knee: Secondary | ICD-10-CM | POA: Diagnosis not present

## 2016-08-29 DIAGNOSIS — M4317 Spondylolisthesis, lumbosacral region: Secondary | ICD-10-CM | POA: Diagnosis not present

## 2016-09-11 DIAGNOSIS — Z0000 Encounter for general adult medical examination without abnormal findings: Secondary | ICD-10-CM | POA: Diagnosis not present

## 2016-09-12 DIAGNOSIS — Z23 Encounter for immunization: Secondary | ICD-10-CM | POA: Diagnosis not present

## 2016-09-12 DIAGNOSIS — Z0000 Encounter for general adult medical examination without abnormal findings: Secondary | ICD-10-CM | POA: Diagnosis not present

## 2016-09-27 DIAGNOSIS — R928 Other abnormal and inconclusive findings on diagnostic imaging of breast: Secondary | ICD-10-CM | POA: Diagnosis not present

## 2016-09-27 DIAGNOSIS — Z853 Personal history of malignant neoplasm of breast: Secondary | ICD-10-CM | POA: Diagnosis not present

## 2016-11-16 ENCOUNTER — Telehealth: Payer: Self-pay | Admitting: *Deleted

## 2016-11-16 NOTE — Telephone Encounter (Signed)
Patient left VM last pm expressing interest in Remember clinical trial (she is a friend of a patient being considered for this trial). Called back and left VM for her to return call to research nurse. Spoke with Dr. Jana Hakim, who agrees to allow participation in this trial and approved moving her 02/2017 follow ups to June or July before the date 02/04/17, which will be > 5 years since last chemo. Enrollment must occur 1-5 years since last chemotherapy treatment.

## 2016-11-19 NOTE — Telephone Encounter (Signed)
Spoke with patient via phone and briefly described the Remember clinical trial and she is very interested in participation. She has agreed to move her September visit with Dr. Jana Hakim to the week of July 9th with her labs the week prior and will see Foye Spurling, RN Research nurse afterwards. Confirmed her mailing address and consent form will be sent to her to begin to review prior to the appointment.

## 2016-11-20 ENCOUNTER — Telehealth: Payer: Self-pay

## 2016-11-20 DIAGNOSIS — M1711 Unilateral primary osteoarthritis, right knee: Secondary | ICD-10-CM | POA: Diagnosis not present

## 2016-11-20 DIAGNOSIS — M11261 Other chondrocalcinosis, right knee: Secondary | ICD-10-CM | POA: Diagnosis not present

## 2016-11-20 NOTE — Telephone Encounter (Signed)
Called patient per Emily Knight C/email to move patients appts closer in July to sign for a new study.  Patient is aware of new dates and times.

## 2016-11-27 DIAGNOSIS — M1711 Unilateral primary osteoarthritis, right knee: Secondary | ICD-10-CM | POA: Diagnosis not present

## 2016-11-28 ENCOUNTER — Encounter: Payer: Self-pay | Admitting: *Deleted

## 2016-11-28 ENCOUNTER — Other Ambulatory Visit: Payer: Self-pay | Admitting: *Deleted

## 2016-11-28 DIAGNOSIS — C50412 Malignant neoplasm of upper-outer quadrant of left female breast: Secondary | ICD-10-CM

## 2016-11-28 DIAGNOSIS — Z170 Estrogen receptor positive status [ER+]: Principal | ICD-10-CM

## 2016-12-18 ENCOUNTER — Other Ambulatory Visit (HOSPITAL_BASED_OUTPATIENT_CLINIC_OR_DEPARTMENT_OTHER): Payer: 59

## 2016-12-18 ENCOUNTER — Encounter: Payer: 59 | Admitting: *Deleted

## 2016-12-18 DIAGNOSIS — C50412 Malignant neoplasm of upper-outer quadrant of left female breast: Secondary | ICD-10-CM

## 2016-12-18 DIAGNOSIS — M23361 Other meniscus derangements, other lateral meniscus, right knee: Secondary | ICD-10-CM | POA: Diagnosis not present

## 2016-12-18 DIAGNOSIS — S83271A Complex tear of lateral meniscus, current injury, right knee, initial encounter: Secondary | ICD-10-CM | POA: Diagnosis not present

## 2016-12-18 DIAGNOSIS — S83231A Complex tear of medial meniscus, current injury, right knee, initial encounter: Secondary | ICD-10-CM | POA: Diagnosis not present

## 2016-12-18 DIAGNOSIS — M94261 Chondromalacia, right knee: Secondary | ICD-10-CM | POA: Diagnosis not present

## 2016-12-18 DIAGNOSIS — Z170 Estrogen receptor positive status [ER+]: Principal | ICD-10-CM

## 2016-12-18 DIAGNOSIS — G8918 Other acute postprocedural pain: Secondary | ICD-10-CM | POA: Diagnosis not present

## 2016-12-18 DIAGNOSIS — M23331 Other meniscus derangements, other medial meniscus, right knee: Secondary | ICD-10-CM | POA: Diagnosis not present

## 2016-12-18 DIAGNOSIS — M1711 Unilateral primary osteoarthritis, right knee: Secondary | ICD-10-CM | POA: Diagnosis not present

## 2016-12-18 LAB — COMPREHENSIVE METABOLIC PANEL
ALT: 42 U/L (ref 0–55)
AST: 26 U/L (ref 5–34)
Albumin: 4.1 g/dL (ref 3.5–5.0)
Alkaline Phosphatase: 53 U/L (ref 40–150)
Anion Gap: 9 mEq/L (ref 3–11)
BUN: 24 mg/dL (ref 7.0–26.0)
CALCIUM: 10.2 mg/dL (ref 8.4–10.4)
CO2: 28 mEq/L (ref 22–29)
Chloride: 106 mEq/L (ref 98–109)
Creatinine: 0.8 mg/dL (ref 0.6–1.1)
EGFR: 76 mL/min/{1.73_m2} — ABNORMAL LOW (ref >90)
Glucose: 98 mg/dl (ref 70–140)
Potassium: 5 mEq/L (ref 3.5–5.1)
Sodium: 143 mEq/L (ref 136–145)
Total Bilirubin: 0.4 mg/dL (ref 0.20–1.20)
Total Protein: 7.1 g/dL (ref 6.4–8.3)

## 2016-12-18 LAB — CBC WITH DIFFERENTIAL/PLATELET
BASO%: 0.9 % (ref 0.0–2.0)
Basophils Absolute: 0.1 10*3/uL (ref 0.0–0.1)
EOS%: 2 % (ref 0.0–7.0)
Eosinophils Absolute: 0.1 10*3/uL (ref 0.0–0.5)
HEMATOCRIT: 43.9 % (ref 34.8–46.6)
HGB: 15.2 g/dL (ref 11.6–15.9)
LYMPH#: 1.8 10*3/uL (ref 0.9–3.3)
LYMPH%: 30.6 % (ref 14.0–49.7)
MCH: 32.1 pg (ref 25.1–34.0)
MCHC: 34.7 g/dL (ref 31.5–36.0)
MCV: 92.5 fL (ref 79.5–101.0)
MONO#: 0.3 10*3/uL (ref 0.1–0.9)
MONO%: 5.1 % (ref 0.0–14.0)
NEUT#: 3.6 10*3/uL (ref 1.5–6.5)
NEUT%: 61.4 % (ref 38.4–76.8)
PLATELETS: 231 10*3/uL (ref 145–400)
RBC: 4.75 10*6/uL (ref 3.70–5.45)
RDW: 12.3 % (ref 11.2–14.5)
WBC: 5.9 10*3/uL (ref 3.9–10.3)

## 2016-12-18 NOTE — Progress Notes (Signed)
DCP-001 Research Note; Met with patient today in clinic after her lab appointment to complete consent and enroll in DCP-001 study.  See consent note for details.  Foye Spurling, BSN, RN Clinical Research Nurse 12/18/2016 10:50 AM

## 2016-12-25 ENCOUNTER — Other Ambulatory Visit: Payer: Self-pay | Admitting: Oncology

## 2016-12-25 ENCOUNTER — Ambulatory Visit (HOSPITAL_BASED_OUTPATIENT_CLINIC_OR_DEPARTMENT_OTHER): Payer: 59 | Admitting: Oncology

## 2016-12-25 ENCOUNTER — Encounter: Payer: 59 | Admitting: *Deleted

## 2016-12-25 VITALS — BP 139/88 | HR 74 | Temp 98.3°F | Resp 18 | Ht 67.0 in | Wt 184.6 lb

## 2016-12-25 DIAGNOSIS — M25661 Stiffness of right knee, not elsewhere classified: Secondary | ICD-10-CM | POA: Diagnosis not present

## 2016-12-25 DIAGNOSIS — N951 Menopausal and female climacteric states: Secondary | ICD-10-CM

## 2016-12-25 DIAGNOSIS — Z853 Personal history of malignant neoplasm of breast: Secondary | ICD-10-CM | POA: Diagnosis not present

## 2016-12-25 DIAGNOSIS — M8580 Other specified disorders of bone density and structure, unspecified site: Secondary | ICD-10-CM

## 2016-12-25 DIAGNOSIS — C50412 Malignant neoplasm of upper-outer quadrant of left female breast: Secondary | ICD-10-CM

## 2016-12-25 DIAGNOSIS — K760 Fatty (change of) liver, not elsewhere classified: Secondary | ICD-10-CM | POA: Diagnosis not present

## 2016-12-25 DIAGNOSIS — Z170 Estrogen receptor positive status [ER+]: Principal | ICD-10-CM

## 2016-12-25 MED ORDER — VENLAFAXINE HCL 37.5 MG PO TABS: 37.5000 mg | ORAL_TABLET | Freq: Two times a day (BID) | ORAL | 0 refills | Status: DC

## 2016-12-25 NOTE — Progress Notes (Signed)
ID: Emily Knight OB: 12/18/1955  MR#: [id number]  EOF#:[id number]  PCP: Hulan Fess, MD GYN:   SU:  Emily Bookbinder, MD OTHER MD: Emily Gibson, MD   HISTORY OF PRESENT ILLNESS: From the original intake note:  The patient was recalled for additional evaluation when a screening mammogram showed an abnormality in the left breast.  A biopsy obtained on 07/24/2011 did confirm left invasive lobular carcinoma, grade 2, ER +20%, PR +10%, HER-2/neu negative, with MIB-1 is 1 of 30%.  A subsequent MRI obtained on 08/01/2011 confirmed a solitary enhancing mass with adjuvant satellite nodularity in the lateral aspect of the left breast, 2.2 x 1.4 x 2.5 cm. There were no enlarged axillary or internal mammary lymph nodes. No abnormal enhancement was seen in the right breast.  Patient subsequently underwent a left lumpectomy with sentinel node dissection under the care of Dr. Donne Hazel on 08/14/2011 revealing a 2.2 cm primary lesion, 2 of 3 sentinel lymph nodes involved with extracapsular extension. She then underwent reexcision of left axillary lymph nodes on 08/28/2011 showing 0 of 4 additional nodes involved.  Details regarding treatment history is noted below.   INTERVAL HISTORY: Emily Knight returns today for follow-up and treatment of her estrogen receptor positive breast cancer accompanied by a friend. She continues on tamoxifen, generally with good tolerance. Hot flashes and vaginal wetness have not been major concerns. She obtains a drug at a very good price.  REVIEW OF SYSTEMS: Emily Knight underwent right knee surgery under and he collins since the last visit here. She did very well and is very pleased with the results. Aside from these issues a detailed review of systems today was stable   PAST MEDICAL HISTORY: Past Medical History:  Diagnosis Date  . Allergy   . Anxiety   . Arthritis    left knee  . Breast cancer 08/14/11 [facility]   Left breast lumpectomy/left axillary lymph node resection,invasive  Grade II Lobular CA in Situ,,left lymph node bx==positive for metastatic lobular ca(2/2)  . Cancer    left breast  . Carotid artery aneurysm    left internal carotid artery; last MRI 02/22/2011  . Cerebral aneurysm    dx by cerebral angiogram,encased in bone  . History of chemotherapy final 02/05/12   4 cycles Adriamycin/Cytoxan and 12 Weeks of Taxol  . Hot flashes   . Hot flushes, perimenopausal   . S/P radiation therapy 02/12/12 -03/26/12   Left Breast, Left Axilla and Left Supraclivcular Region  . Stress fracture    left foot/ankle  . Use of tamoxifen (Nolvadex) Start - 04/10/12    PAST SURGICAL HISTORY: Past Surgical History:  Procedure Laterality Date  . ABDOMINAL HYSTERECTOMY  1980's   ovaries intact  . AXILLARY LYMPH NODE DISSECTION  08/28/2011   Procedure: AXILLARY LYMPH NODE DISSECTION;  Surgeon: Emily Bookbinder, MD;  Location: [place];  Service: General;  Laterality: Left;  Left axillary node dissection, port placement  . BREAST LUMPECTOMY  08/14/11   Left breast lumpectomy, left axillary sentinel node biopsy  . left knee reconstruction     six surgeries (3 orthoscopic) 3 open, ACL  . PARTIAL HYSTERECTOMY  1984? 1989?    for fibroids   . PORT-A-CATH REMOVAL  05/29/2012   Procedure: REMOVAL PORT-A-CATH;  Surgeon: Emily Bookbinder, MD;  Location: [place];  Service: General;  Laterality: N/A;  . PORTACATH PLACEMENT  08/28/2011   Procedure: INSERTION PORT-A-CATH;  Surgeon: Emily Bookbinder, MD;  Location: [place];  Service: General;  Laterality: Right;  . TONSILLECTOMY AND ADENOIDECTOMY  1971  . TRIGGER FINGER RELEASE  06/29/2008   release A-1 pulley and exc. cyst left thumb    FAMILY HISTORY Family History  Problem Relation Age of Onset  . Anesthesia problems Mother        post-op N/V  . Cancer Father 57       lung - ages 46 and 42; prostate  . Cancer Maternal Aunt 58       ovarian  . Cancer Paternal Aunt         lung  . Cancer Paternal Uncle        bone cancer  . Melanoma Cousin 15       female ,skin   the patient's father died at the age of 80 from metastatic lung cancer. He had a remote history of smoking. The patient's mother is living at age 64. The patient has 2 brothers, no sisters. There is no other history of breast cancer in the family. She did have a maternal aunt with ovarian cancer. The patient has been tested for the BRCA gene and is negative. In  GYNECOLOGIC HISTORY:  Menarche age 40. She is GX P0. She had a simple hysterectomy, no salpingo-oophorectomy at age 46. She never took hormone replacement.  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 At home she lives with Emily Knight and they recently got married. They also have 2 dogs.    ADVANCED DIRECTIVES: In place. Emily Knight is Emily Knight's health care power of attorney   HEALTH MAINTENANCE: Social History  Substance Use Topics  . Smoking status: Former Smoker    Packs/day: 1.00    Years: 15.00    Types: Cigarettes    Quit date: 01/23/2001  . Smokeless tobacco: Never Used     Comment: quit smoking 10 yrs. ago  . Alcohol use Yes     Comment: occasionally     Colonoscopy: Not on file  PAP: /status post hysterectomy  Bone density: 02/09/2014 at [facility], osteopenia with a T score of - 2.0 (compared with1.4 - two years prior)   Lipid panel: Not on file/Dr. Little   Allergies  Allergen Reactions  . Adhesive [Tape] Other (See Comments)    BLISTERS  . Codeine Nausea And Vomiting    Current Outpatient Prescriptions  Medication Sig Dispense Refill  . CELEBREX 200 MG capsule     . cholecalciferol (VITAMIN D) 1000 UNITS tablet Take 1,000 Units by mouth daily.    . cloNIDine (CATAPRES - DOSED IN MG/24 HR) 0.1 mg/24hr patch UNWRAP AND APPLY 1 PATCH TO CLEAN, DRY AND INTACT  SKIN  AND CHANGE ONCE WEEKLY 4 patch 3  . tamoxifen (NOLVADEX) 20 MG tablet TAKE 1 TABLET BY MOUTH  DAILY 90 tablet 4  . venlafaxine (EFFEXOR) 37.5 MG tablet TAKE 1 TABLET BY MOUTH  TWICE A DAY WITH A MEAL 90 tablet 1   No current facility-administered medications for this visit.     OBJECTIVE: Middle-aged womanWho appears well  Vitals:   12/25/16 1159  BP: 139/88  Pulse: 74  Resp: 18  Temp: 98.3 F (36.8 C)     Body mass index is 28.91 kg/m.    ECOG FS: 0 Filed Weights   12/25/16 1159  Weight: 184 lb 9.6 oz (83.7 kg)   Sclerae unicteric, EOMs intact  Oropharynx clear and moist No cervical or supraclavicular adenopathy Lungs no rales or rhonchi Heart regular rate and rhythm Abd soft, nontender, positive bowel sounds MSK no focal spinal tenderness, no upper extremity lymphedema Neuro: nonfocal, well oriented, appropriate affect Breasts: The right breast is benign. The left breast is undergone lumpectomy and radiation with no evidence of local recurrence. Both axillae are benign.  LAB RESULTS:   Lab Results  Component Value Date   WBC 5.9 12/18/2016   NEUTROABS 3.6 12/18/2016   HGB 15.2 12/18/2016   HCT 43.9 12/18/2016   MCV 92.5 12/18/2016   PLT 231 12/18/2016      Chemistry      Component Value Date/Time   NA 143 12/18/2016 1035   K 5.0 12/18/2016 1035   CL 105 09/02/2014 2307   CL 104 12/09/2012 0837   CO2 28 12/18/2016 1035   BUN 24.0 12/18/2016 1035   CREATININE 0.8 12/18/2016 1035      Component Value Date/Time   CALCIUM 10.2 12/18/2016 1035   ALKPHOS 53 12/18/2016 1035   AST 26 12/18/2016 1035   ALT 42 12/18/2016 1035   BILITOT 0.40 12/18/2016 1035      STUDIES: Mammography is up-to-date at [facility] March 2018. She also had a lumbar MRI 07/28/2016 and a CT of the abdomen and pelvis with and without contrast 08/07/2016. There was no evidence of metastatic disease.   ASSESSMENT: 61 y.o. BRCA negative [place] woman  (1)  Status post LEFT upper  outer quadrant lumpectomy with sentinel node dissection on 08/14/2011 for a pT2 pN1, stage IIB invasive lobular breast cancer,  ER 20%, PR 10%, HER2 negative, Ki-67 30%.  Re-excision of left axillary lymph nodes on 08/28/2011 showed 0 of 4 nodes involved.  (2)  Enrolled in the NSABP B-49  Protocol. Received 4 cycles of adjuvant dose dense doxorubicin/ cyclophosphamide , followed by 12 weekly cycles of paclitaxel, completed 02/05/2012.   (3)  radiation therapy completed 03/26/2012   (4) began tamoxifen October 2013, completing 5 years July 2018  (5)  hot flashes associated with tamoxifen, on Effexor 75 mg daily and Catapres-TTS-1 patches  (6) osteopenia,  bone density 02/09/2014 showed a T score - 2.0  (a) zolendronate started 03/11/2014, to be repeated yearly.  (7) status post simple hysterectomy, and no salpingo-oophorectomy   (8) hepatic steatosis      PLAN: Emily Knight is now a little over 5 years out from definitive surgery for her breast cancer with no evidence of disease recurrence. This is very favorable.  I am comfortable with her discontinuing tamoxifen at this point grade in some patients we recommend concurrent going to a total of 10 years but she received chemotherapy in addition and she is very eager to come off the medication. The benefit of an additional 5 years would be very marginal.  She also may go off the TTS 1 patches. She also wishes to go off the venlafaxine but this will require a taper. Accordingly I have switched her to the generic, twice a day form. She will take this for 3 days, then she will go to once a day for a week, then every other day for 2 weeks, and then stop  At this point I feel comfortable releasing Emily Knight to her primary care physician. As far as breast cancer follow-up is concerned or she will need is yearly mammography which she receives at [facility] and yearly physician breast exam  I will be glad to see her again at any point in  the future if on when the  need arises but as of now we're making no further routine appointment for her here.    Chauncey Cruel, MD   12/25/2016 12:30 PM

## 2016-12-27 DIAGNOSIS — M25661 Stiffness of right knee, not elsewhere classified: Secondary | ICD-10-CM | POA: Diagnosis not present

## 2017-01-01 DIAGNOSIS — M25661 Stiffness of right knee, not elsewhere classified: Secondary | ICD-10-CM | POA: Diagnosis not present

## 2017-01-03 DIAGNOSIS — M25661 Stiffness of right knee, not elsewhere classified: Secondary | ICD-10-CM | POA: Diagnosis not present

## 2017-01-10 DIAGNOSIS — M25661 Stiffness of right knee, not elsewhere classified: Secondary | ICD-10-CM | POA: Diagnosis not present

## 2017-01-16 DIAGNOSIS — M25661 Stiffness of right knee, not elsewhere classified: Secondary | ICD-10-CM | POA: Diagnosis not present

## 2017-03-04 ENCOUNTER — Other Ambulatory Visit: Payer: 59

## 2017-03-11 ENCOUNTER — Ambulatory Visit: Payer: 59 | Admitting: Oncology

## 2017-04-02 ENCOUNTER — Other Ambulatory Visit: Payer: Self-pay | Admitting: Oncology

## 2017-04-25 ENCOUNTER — Other Ambulatory Visit: Payer: Self-pay | Admitting: Nurse Practitioner

## 2017-04-25 DIAGNOSIS — M1711 Unilateral primary osteoarthritis, right knee: Secondary | ICD-10-CM | POA: Diagnosis not present

## 2017-04-25 DIAGNOSIS — M11261 Other chondrocalcinosis, right knee: Secondary | ICD-10-CM | POA: Diagnosis not present

## 2017-04-27 ENCOUNTER — Other Ambulatory Visit: Payer: Self-pay | Admitting: Nurse Practitioner

## 2017-05-14 DIAGNOSIS — H5201 Hypermetropia, right eye: Secondary | ICD-10-CM | POA: Diagnosis not present

## 2017-05-14 DIAGNOSIS — H524 Presbyopia: Secondary | ICD-10-CM | POA: Diagnosis not present

## 2017-05-14 DIAGNOSIS — H52221 Regular astigmatism, right eye: Secondary | ICD-10-CM | POA: Diagnosis not present

## 2017-05-28 NOTE — Progress Notes (Signed)
[place]  Telephone:([phone]) [phone] [phone]) [phone]    ID: Emily Knight OB: Sep 24, 1955  MR#: [id number]  YQM#:[id number]  PCP: Hulan Fess, MD GYN:   SU:  Rolm Bookbinder, MD OTHER MD: Eppie Gibson, MD   HISTORY OF PRESENT ILLNESS: From the original intake note:  The patient was recalled for additional evaluation when a screening mammogram showed an abnormality in the left breast.  A biopsy obtained on 07/24/2011 did confirm left invasive lobular carcinoma, grade 2, ER +20%, PR +10%, HER-2/neu negative, with MIB-1 is 1 of 30%.  A subsequent MRI obtained on 08/01/2011 confirmed a solitary enhancing mass with adjuvant satellite nodularity in the lateral aspect of the left breast, 2.2 x 1.4 x 2.5 cm. There were no enlarged axillary or internal mammary lymph nodes. No abnormal enhancement was seen in the right breast.  Patient subsequently underwent a left lumpectomy with sentinel node dissection under the care of Dr. Donne Hazel on 08/14/2011 revealing a 2.2 cm primary lesion, 2 of 3 sentinel lymph nodes involved with extracapsular extension. She then underwent reexcision of left axillary lymph nodes on 08/28/2011 showing 0 of 4 additional nodes involved.  Details regarding treatment history is noted below.   INTERVAL HISTORY: Emily Knight returns today to discuss her options to manage her hot flashes that never improved after stopping tamoxifen.  I had discharged her from follow-up here July 2018, and she expected her hot flashes to go away when she stopped the tamoxifen.  Actually they did not go away but remained about the same.  However since she stopped the venlafaxine and TTS 1 patches at the same time, they are if anything a little bit worse now than before.    She currently takes D3 and probiotics but no other medicines.Marland Kitchen    REVIEW OF SYSTEMS: Emily Knight is doing well. She has been busy as a Production assistant, radio and reports that she is constantly moving. The patient takes her new  dog for walks every day.  Otherwise she is not exercising regularly although she does have a membership at [employer]. She denies unusual headaches, visual changes, nausea, vomiting, or dizziness. There has been no unusual cough, phlegm production, or pleurisy. This been no change in bowel or bladder habits. She denies unexplained fatigue or unexplained weight loss, bleeding, rash, or fever. A detailed review of systems was otherwise entirely stable.    PAST MEDICAL HISTORY: Past Medical History:  Diagnosis Date  . Allergy   . Anxiety   . Arthritis    left knee  . Breast cancer 08/14/11 [facility]   Left breast lumpectomy/left axillary lymph node resection,invasive Grade II Lobular CA in Situ,,left lymph node bx==positive for metastatic lobular ca(2/2)  . Cancer    left breast  . Carotid artery aneurysm    left internal carotid artery; last MRI 02/22/2011  . Cerebral aneurysm    dx by cerebral angiogram,encased in bone  . History of chemotherapy final 02/05/12   4 cycles Adriamycin/Cytoxan and 12 Weeks of Taxol  . Hot flashes   . Hot flushes, perimenopausal   . S/P radiation therapy 02/12/12 -03/26/12   Left Breast, Left Axilla and Left Supraclivcular Region  . Stress fracture    left foot/ankle  . Use of tamoxifen (Nolvadex) Start - 04/10/12    PAST SURGICAL HISTORY: Past Surgical History:  Procedure Laterality Date  . ABDOMINAL HYSTERECTOMY  1980's   ovaries intact  . AXILLARY LYMPH NODE DISSECTION  08/28/2011   Procedure: AXILLARY LYMPH NODE DISSECTION;  Surgeon:  Rolm Bookbinder, MD;  Location: [place];  Service: General;  Laterality: Left;  Left axillary node dissection, port placement  . BREAST LUMPECTOMY  08/14/11   Left breast lumpectomy, left axillary sentinel node biopsy  . left knee reconstruction     six surgeries (3 orthoscopic) 3 open, ACL  . PARTIAL HYSTERECTOMY  1984? 1989?    for fibroids   . PORT-A-CATH REMOVAL  05/29/2012   Procedure: REMOVAL  PORT-A-CATH;  Surgeon: Rolm Bookbinder, MD;  Location: [place];  Service: General;  Laterality: N/A;  . PORTACATH PLACEMENT  08/28/2011   Procedure: INSERTION PORT-A-CATH;  Surgeon: Rolm Bookbinder, MD;  Location: [place];  Service: General;  Laterality: Right;  . TONSILLECTOMY AND ADENOIDECTOMY  1971  . TRIGGER FINGER RELEASE  06/29/2008   release A-1 pulley and exc. cyst left thumb    FAMILY HISTORY Family History  Problem Relation Age of Onset  . Anesthesia problems Mother        post-op N/V  . Cancer Father 75       lung - ages 65 and 47; prostate  . Cancer Maternal Aunt 58       ovarian  . Cancer Paternal Aunt        lung  . Cancer Paternal Uncle        bone cancer  . Melanoma Cousin 67       female ,skin   the patient's father died at the age of 21 from metastatic lung cancer. He had a remote history of smoking. The patient's mother is living at age 49. The patient has 2 brothers, no sisters. There is no other history of breast cancer in the family. She did have a maternal aunt with ovarian cancer. The patient has been tested for the BRCA gene and is negative. In  GYNECOLOGIC HISTORY:  Menarche age 27. She is GX P0. She had a simple hysterectomy, no salpingo-oophorectomy at age 66. She never took hormone replacement.  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 At home she lives with her wife, Emily Knight. She recently got a new dog.   ADVANCED DIRECTIVES: In place. Emily Knight is Emily Knight's health care power of attorney   HEALTH MAINTENANCE: Social History   Tobacco Use  . Smoking status: Former Smoker    Packs/day: 1.00    Years: 15.00    Pack years: 15.00    Types: Cigarettes    Last attempt to quit: 01/23/2001    Years since  quitting: 16.3  . Smokeless tobacco: Never Used  . Tobacco comment: quit smoking 10 yrs. ago  Substance Use Topics  . Alcohol use: Yes    Comment: occasionally  . Drug use: No    Comment: quit smoking 2002, smoked for 10 quit restarted for 5 more     Colonoscopy: Not on file  PAP: /status post hysterectomy  Bone density: 02/09/2014 at [facility], osteopenia with a T score of - 2.0 (compared with1.4 - two years prior)   Lipid panel: Not on file/Dr. Little   Allergies  Allergen Reactions  . Adhesive [Tape] Other (See Comments)    BLISTERS  . Codeine Nausea And Vomiting    Current Outpatient Medications  Medication Sig Dispense Refill  . CELEBREX 200 MG  capsule     . cholecalciferol (VITAMIN D) 1000 UNITS tablet Take 1,000 Units by mouth daily.    . cloNIDine (CATAPRES - DOSED IN MG/24 HR) 0.1 mg/24hr patch UNWRAP AND APPLY 1 PATCH TO CLEAN, DRY AND INTACT SKIN  AND CHANGE ONCE WEEKLY 4 patch 3  . venlafaxine (EFFEXOR) 37.5 MG tablet TAKE 1 TABLET(37.5 MG) BY MOUTH TWICE DAILY 187 tablet 0   No current facility-administered medications for this visit.     OBJECTIVE: Middle-aged woman in no acute distress  Vitals:   05/29/17 0945  BP: 134/90  Pulse: 71  Resp: 18  Temp: 98 F (36.7 C)  SpO2: 98%     Body mass index is 29.59 kg/m.    ECOG FS: 0 Filed Weights   05/29/17 0945  Weight: 188 lb 14.4 oz (85.7 kg)   Sclerae unicteric, pupils round and equal Oropharynx clear and moist No cervical or supraclavicular adenopathy Lungs no rales or rhonchi Heart regular rate and rhythm Abd soft, nontender, positive bowel sounds MSK no focal spinal tenderness, no upper extremity lymphedema Neuro: nonfocal, well oriented, appropriate affect Breasts: The right breast is unremarkable.  The left breast is status post lumpectomy followed by radiation with no evidence of local recurrence.  Both axillae are benign.  LAB RESULTS:   Lab Results  Component Value Date   WBC 5.9 12/18/2016     NEUTROABS 3.6 12/18/2016   HGB 15.2 12/18/2016   HCT 43.9 12/18/2016   MCV 92.5 12/18/2016   PLT 231 12/18/2016      Chemistry      Component Value Date/Time   NA 143 12/18/2016 1035   K 5.0 12/18/2016 1035   CL 105 09/02/2014 2307   CL 104 12/09/2012 0837   CO2 28 12/18/2016 1035   BUN 24.0 12/18/2016 1035   CREATININE 0.8 12/18/2016 1035      Component Value Date/Time   CALCIUM 10.2 12/18/2016 1035   ALKPHOS 53 12/18/2016 1035   AST 26 12/18/2016 1035   ALT 42 12/18/2016 1035   BILITOT 0.40 12/18/2016 1035      STUDIES: Mammography is up-to-date at [facility] March 2018. She also had a lumbar MRI 07/28/2016 and a CT of the abdomen and pelvis with and without contrast 08/07/2016. There was no evidence of metastatic disease.   ASSESSMENT: 61 y.o. BRCA negative Emily Knight woman  (1)  Status post LEFT upper outer quadrant lumpectomy with sentinel node dissection on 08/14/2011 for a pT2 pN1, stage IIB invasive lobular breast cancer,  ER 20%, PR 10%, HER2 negative, Ki-67 30%.  Re-excision of left axillary lymph nodes on 08/28/2011 showed 0 of 4 nodes involved.  (2)  Enrolled in the NSABP B-49  Protocol. Received 4 cycles of adjuvant dose dense doxorubicin/ cyclophosphamide , followed by 12 weekly cycles of paclitaxel, completed 02/05/2012.   (3)  radiation therapy completed 03/26/2012   (4) began tamoxifen October 2013, completing 5 years July 2018  (5)  hot flashes associated with tamoxifen, on Effexor 75 mg daily and Catapres-TTS-1 patches  (6) osteopenia,  bone density 02/09/2014 showed a T score - 2.0  (a) zolendronate started 03/11/2014, to be repeated yearly.  (7) status post simple hysterectomy, and no salpingo-oophorectomy   (8) hepatic steatosis  (9) persistent vasomotor symptoms despite going off tamoxifen      PLAN: Coreen is doing great all around except for problems with hot flashes.  She would like to return to venlafaxine and the TTS 1 patches.  We  discussed  this in detail today and of course I was glad to write those prescriptions for her.  I have put those in through her [facility] and she has a years worth of the medications.  If they do not work of course she will stop.  If she wishes to increase the dose of venlafaxine she would let us know.  Otherwise she can discuss with her primary care physician Dr. Rex Kras whether he is comfortable prescribing these for her in the future.  If so she does not need to return here.  Otherwise I will be glad to see her on an as-needed basis  I did encourage her to exercise regularly.  She knows to call for any other issues that may develop before the next visit.   Avigayil Ton, Virgie Dad, MD  05/29/17 10:17 AM Medical Oncology and Hematology [facility] [address] [place], [place] [address] Tel. [phone]    Fax. [phone]  This document serves as a record of services personally performed by Chauncey Cruel, MD. It was created on his behalf by Margit Banda, a trained medical scribe. The creation of this record is based on the scribe's personal observations and the provider's statements to them.   I have reviewed the above documentation for accuracy and completeness, and I agree with the above.

## 2017-05-29 ENCOUNTER — Ambulatory Visit: Payer: 59 | Admitting: Oncology

## 2017-05-29 VITALS — BP 134/90 | HR 71 | Temp 98.0°F | Resp 18 | Ht 67.0 in | Wt 188.9 lb

## 2017-05-29 DIAGNOSIS — Z170 Estrogen receptor positive status [ER+]: Secondary | ICD-10-CM

## 2017-05-29 DIAGNOSIS — N951 Menopausal and female climacteric states: Secondary | ICD-10-CM | POA: Diagnosis not present

## 2017-05-29 DIAGNOSIS — M8580 Other specified disorders of bone density and structure, unspecified site: Secondary | ICD-10-CM | POA: Diagnosis not present

## 2017-05-29 DIAGNOSIS — C50412 Malignant neoplasm of upper-outer quadrant of left female breast: Secondary | ICD-10-CM

## 2017-05-29 MED ORDER — CLONIDINE 0.1 MG/24HR TD PTWK: 0.1000 mg | MEDICATED_PATCH | TRANSDERMAL | 12 refills | Status: DC

## 2017-05-29 MED ORDER — VENLAFAXINE HCL ER 37.5 MG PO CP24: 37.5000 mg | ORAL_CAPSULE | Freq: Every day | ORAL | 4 refills | Status: DC

## 2017-07-31 DIAGNOSIS — M79671 Pain in right foot: Secondary | ICD-10-CM | POA: Diagnosis not present

## 2017-07-31 DIAGNOSIS — M79672 Pain in left foot: Secondary | ICD-10-CM | POA: Diagnosis not present

## 2017-07-31 DIAGNOSIS — M2042 Other hammer toe(s) (acquired), left foot: Secondary | ICD-10-CM | POA: Diagnosis not present

## 2017-08-14 DIAGNOSIS — M2042 Other hammer toe(s) (acquired), left foot: Secondary | ICD-10-CM | POA: Diagnosis not present

## 2017-08-14 DIAGNOSIS — M79672 Pain in left foot: Secondary | ICD-10-CM | POA: Diagnosis not present

## 2017-08-21 DIAGNOSIS — M2042 Other hammer toe(s) (acquired), left foot: Secondary | ICD-10-CM | POA: Diagnosis not present

## 2017-08-21 DIAGNOSIS — M79672 Pain in left foot: Secondary | ICD-10-CM | POA: Diagnosis not present

## 2017-09-12 DIAGNOSIS — E786 Lipoprotein deficiency: Secondary | ICD-10-CM | POA: Diagnosis not present

## 2017-09-12 DIAGNOSIS — E119 Type 2 diabetes mellitus without complications: Secondary | ICD-10-CM | POA: Diagnosis not present

## 2017-09-13 DIAGNOSIS — Z0000 Encounter for general adult medical examination without abnormal findings: Secondary | ICD-10-CM | POA: Diagnosis not present

## 2017-09-13 DIAGNOSIS — E786 Lipoprotein deficiency: Secondary | ICD-10-CM | POA: Diagnosis not present

## 2017-10-08 ENCOUNTER — Encounter: Payer: Self-pay | Admitting: Oncology

## 2017-10-08 DIAGNOSIS — R928 Other abnormal and inconclusive findings on diagnostic imaging of breast: Secondary | ICD-10-CM | POA: Diagnosis not present

## 2017-10-08 DIAGNOSIS — Z853 Personal history of malignant neoplasm of breast: Secondary | ICD-10-CM | POA: Diagnosis not present

## 2017-11-28 DIAGNOSIS — M25422 Effusion, left elbow: Secondary | ICD-10-CM | POA: Diagnosis not present

## 2018-02-12 DIAGNOSIS — M65311 Trigger thumb, right thumb: Secondary | ICD-10-CM | POA: Insufficient documentation

## 2018-02-12 DIAGNOSIS — M79641 Pain in right hand: Secondary | ICD-10-CM | POA: Diagnosis not present

## 2018-04-24 DIAGNOSIS — M65311 Trigger thumb, right thumb: Secondary | ICD-10-CM | POA: Diagnosis not present

## 2018-05-28 DIAGNOSIS — M65311 Trigger thumb, right thumb: Secondary | ICD-10-CM | POA: Diagnosis not present

## 2018-06-20 ENCOUNTER — Other Ambulatory Visit: Payer: Self-pay | Admitting: Oncology

## 2018-08-11 DIAGNOSIS — N951 Menopausal and female climacteric states: Secondary | ICD-10-CM | POA: Diagnosis not present

## 2018-08-14 DIAGNOSIS — L659 Nonscarring hair loss, unspecified: Secondary | ICD-10-CM | POA: Diagnosis not present

## 2018-11-06 DIAGNOSIS — Z853 Personal history of malignant neoplasm of breast: Secondary | ICD-10-CM | POA: Diagnosis not present

## 2018-11-06 DIAGNOSIS — Z1231 Encounter for screening mammogram for malignant neoplasm of breast: Secondary | ICD-10-CM | POA: Diagnosis not present

## 2018-11-13 DIAGNOSIS — M79641 Pain in right hand: Secondary | ICD-10-CM | POA: Diagnosis not present

## 2018-11-13 DIAGNOSIS — M65311 Trigger thumb, right thumb: Secondary | ICD-10-CM | POA: Diagnosis not present

## 2019-01-13 ENCOUNTER — Encounter: Payer: Self-pay | Admitting: Oncology

## 2019-04-01 ENCOUNTER — Other Ambulatory Visit: Payer: Self-pay

## 2019-04-01 DIAGNOSIS — Z20822 Contact with and (suspected) exposure to covid-19: Secondary | ICD-10-CM

## 2019-04-02 LAB — NOVEL CORONAVIRUS, NAA: SARS-CoV-2, NAA: NOT DETECTED

## 2019-06-03 ENCOUNTER — Other Ambulatory Visit: Payer: Self-pay

## 2019-06-03 ENCOUNTER — Ambulatory Visit: Payer: 59 | Attending: Internal Medicine

## 2019-06-03 DIAGNOSIS — Z20822 Contact with and (suspected) exposure to covid-19: Secondary | ICD-10-CM

## 2019-06-05 LAB — NOVEL CORONAVIRUS, NAA: SARS-CoV-2, NAA: NOT DETECTED

## 2019-08-12 ENCOUNTER — Other Ambulatory Visit: Payer: Self-pay | Admitting: Oncology

## 2020-08-22 ENCOUNTER — Telehealth: Payer: Self-pay | Admitting: Family Medicine

## 2020-08-22 NOTE — Telephone Encounter (Signed)
Patient is requesting to become a new patient with Dr. Quay Burow. Her current PCP Dr. Hulan Fess retired suddenly.   Please advise.

## 2020-08-22 NOTE — Telephone Encounter (Signed)
Yes, ok 

## 2020-09-13 ENCOUNTER — Encounter: Payer: Self-pay | Admitting: Internal Medicine

## 2020-09-13 NOTE — Progress Notes (Signed)
Subjective:    Patient ID: Emily Knight, female    DOB: [birth year], 65 y.o.   MRN: [id number]  HPI  She is here to establish with a new pcp.  She is here for a physical exam.    B/l hip pain - less flexible.  Stiff, no pain with walking.  She denies lower back pain.  Knee surgery -she has had arthroscopic surgery in both of her knees.  Knee injuries likely related to sports when she was younger.   Walking dog, active at work, e bike.  Medications and allergies reviewed with patient and updated if appropriate.  Patient Active Problem List   Diagnosis Date Noted  . Cerebral aneurysm   . Osteopenia 09/07/2013  . Postmenopausal estrogen deficiency 09/07/2013  . Malignant neoplasm of upper-outer quadrant of left breast in female, estrogen receptor positive ([place]) 06/02/2013  . Lymphedema 01/12/2013  . Hot flashes   . FOOT PAIN, LEFT 11/09/2009  . STRESS FRACTURE OF THE METATARSALS 11/09/2009  . HALLUX VALGUS 11/09/2009    Current Outpatient Medications on File Prior to Visit  Medication Sig Dispense Refill  . Biotin 1000 MCG tablet Take 1,000 mcg by mouth 2 (two) times daily.    . celecoxib (CELEBREX) 200 MG capsule celecoxib 200 mg capsule    . Cholecalciferol (VITAMIN D3) 125 MCG (5000 UT) CAPS Take 5,000 Units by mouth daily.    . Probiotic Product (PROBIOTIC DAILY PO) Take by mouth.    . valACYclovir HCl (VALTREX PO) Take 1 g by mouth. As needed for fever blisters    . venlafaxine XR (EFFEXOR-XR) 37.5 MG 24 hr capsule TAKE 1 CAPSULE BY MOUTH  DAILY WITH BREAKFAST 90 capsule 3   No current facility-administered medications on file prior to visit.    Past Medical History:  Diagnosis Date  . Allergy   . Anxiety   . Arthritis    left knee  . Breast cancer ([place]) 08/14/11 [facility]   Left breast lumpectomy/left axillary lymph node resection,invasive Grade II Lobular CA in Situ,,left lymph node bx==positive for metastatic lobular ca(2/2)  . Cancer [facility])    left breast  .  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 Cerebral aneurysm    dx by cerebral angiogram,encased in bone  . History of chemotherapy final 02/05/12   4 cycles Adriamycin/Cytoxan and 12 Weeks of Taxol  . Hot flashes   . Hot flushes, perimenopausal   . S/P radiation therapy 02/12/12 -03/26/12   Left Breast, Left Axilla and Left Supraclivcular Region  . Stress fracture    left foot/ankle  . Use of tamoxifen (Nolvadex) Start - 04/10/12    Past Surgical History:  Procedure Laterality Date  . ABDOMINAL HYSTERECTOMY  1980's   ovaries intact  . AXILLARY LYMPH NODE DISSECTION  08/28/2011   Procedure: AXILLARY LYMPH NODE DISSECTION;  Surgeon: Rolm Bookbinder, MD;  Location: [place];  Service: General;  Laterality: Left;  Left axillary node dissection, port placement  . BREAST LUMPECTOMY  08/14/11   Left breast lumpectomy, left axillary sentinel node biopsy  . left knee reconstruction     six surgeries (3 orthoscopic) 3 open, ACL  . PARTIAL HYSTERECTOMY  1984? 1989?    for fibroids   . PORT-A-CATH REMOVAL  05/29/2012   Procedure: REMOVAL PORT-A-CATH;  Surgeon: Rolm Bookbinder, MD;  Location: [place];  Service: General;  Laterality: N/A;  . PORTACATH PLACEMENT  08/28/2011  Procedure: INSERTION PORT-A-CATH;  Surgeon: Rolm Bookbinder, MD;  Location: [place];  Service: General;  Laterality: Right;  . TONSILLECTOMY AND ADENOIDECTOMY  1971  . TRIGGER FINGER RELEASE  06/29/2008   release A-1 pulley and exc. cyst left thumb    Social History   Socioeconomic History  . Marital status: Married    Spouse name: Not on file  . Number of children: Not on file  . Years of education: Not on file  . Highest education level: Not on file  Occupational History  . Occupation: Retired Wellsite geologist  Tobacco Use  . Smoking status: Former Smoker    Packs/day: 1.00    Years: 15.00    Pack years: 15.00     Types: Cigarettes    Quit date: 01/23/2001    Years since quitting: 19.6  . Smokeless tobacco: Never Used  . Tobacco comment: quit smoking 10 yrs. ago  Substance and Sexual Activity  . Alcohol use: Yes    Comment: occasionally  . Drug use: No    Comment: quit smoking 2002, smoked for 10 quit restarted for 5 more  . Sexual activity: Not on file    Comment: hrt 1 year    Other Topics Concern  . Not on file  Social History Narrative   Retired Engineer, structural x 30 yrs, single   Social Determinants of Radio broadcast assistant Strain: Not on [employer] Insecurity: Not on file  Transportation Needs: Not on file  Physical Activity: Not on file  Stress: Not on file  Social Connections: Not on file    Family History  Problem Relation Age of Onset  . Anesthesia problems Mother        post-op N/V  . Cancer Father 69       lung - ages 36 and 60; prostate  . Cancer Maternal Aunt 58       ovarian  . Cancer Paternal Aunt        lung  . Cancer Paternal Uncle        bone cancer  . Melanoma Cousin 53       female ,skin    Review of Systems  Constitutional: Negative for chills and fever.  Eyes: Negative for visual disturbance.  Respiratory: Negative for cough, shortness of breath and wheezing.   Cardiovascular: Positive for leg swelling (left side only). Negative for chest pain and palpitations.  Gastrointestinal: Negative for abdominal pain, blood in stool, constipation, diarrhea and nausea.  Genitourinary: Negative for dysuria.  Musculoskeletal: Positive for arthralgias. Negative for back pain.  Skin: Negative for rash.  Neurological: Negative for light-headedness and headaches.  Psychiatric/Behavioral: Negative for dysphoric mood. The patient is not nervous/anxious.        Objective:   Vitals:   09/14/20 0830  BP: 122/78  Pulse: 75  Temp: 98.5 F (36.9 C)  SpO2: 97%   BP Readings from Last 3 Encounters:  09/14/20 122/78  05/29/17 134/90  12/25/16 139/88   Wt  Readings from Last 3 Encounters:  09/14/20 183 lb (83 kg)  05/29/17 188 lb 14.4 oz (85.7 kg)  12/25/16 184 lb 9.6 oz (83.7 kg)   Body mass index is 28.66 kg/m.   Physical Exam    Constitutional: She appears well-developed and well-nourished. No distress.  HENT:  Head: Normocephalic and atraumatic.  Right Ear: External ear normal. Normal ear canal and TM Left Ear: External ear normal.  Normal ear canal and TM Mouth/Throat: Oropharynx is clear and  moist.  Eyes: Conjunctivae and EOM are normal.  Neck: Neck supple. No tracheal deviation present. No thyromegaly present.  No carotid bruit  Cardiovascular: Normal rate, regular rhythm and normal heart sounds.   No murmur heard.  No edema. Pulmonary/Chest: Effort normal and breath sounds normal. No respiratory distress. She has no wheezes. She has no rales.  Abdominal: Soft. She exhibits no distension. There is no tenderness.  Lymphadenopathy: She has no cervical adenopathy.  Skin: Skin is warm and dry. She is not diaphoretic.  Psychiatric: She has a normal mood and affect. Her behavior is normal.      Assessment & Plan:    Physical exam: Screening blood work   ordered Immunizations: Vaccines up-to-date-discussed second booster, will likely get shingles vaccine Colonoscopy   up-to-date Mammogram   up-to-date Gyn   no longer seen Eye exams    up-to-date Exercise   active, walking, E bike Weight working on weight loss Substance abuse   none      See Problem List for Assessment and Plan of chronic medical problems.    This visit occurred during the SARS-CoV-2 public health emergency.  Safety protocols were in place, including screening questions prior to the visit, additional usage of staff PPE, and extensive cleaning of exam room while observing appropriate contact time as indicated for disinfecting solutions.

## 2020-09-14 ENCOUNTER — Ambulatory Visit: Payer: 59 | Admitting: Internal Medicine

## 2020-09-14 ENCOUNTER — Other Ambulatory Visit: Payer: Self-pay | Admitting: Oncology

## 2020-09-14 ENCOUNTER — Other Ambulatory Visit: Payer: Self-pay

## 2020-09-14 ENCOUNTER — Encounter: Payer: Self-pay | Admitting: Internal Medicine

## 2020-09-14 VITALS — BP 122/78 | HR 75 | Temp 98.5°F | Ht 67.0 in | Wt 183.0 lb

## 2020-09-14 DIAGNOSIS — M25559 Pain in unspecified hip: Secondary | ICD-10-CM | POA: Diagnosis not present

## 2020-09-14 DIAGNOSIS — M25561 Pain in right knee: Secondary | ICD-10-CM

## 2020-09-14 DIAGNOSIS — I671 Cerebral aneurysm, nonruptured: Secondary | ICD-10-CM

## 2020-09-14 DIAGNOSIS — Z0000 Encounter for general adult medical examination without abnormal findings: Secondary | ICD-10-CM

## 2020-09-14 DIAGNOSIS — Z853 Personal history of malignant neoplasm of breast: Secondary | ICD-10-CM | POA: Diagnosis not present

## 2020-09-14 DIAGNOSIS — G8929 Other chronic pain: Secondary | ICD-10-CM

## 2020-09-14 DIAGNOSIS — M25562 Pain in left knee: Secondary | ICD-10-CM | POA: Insufficient documentation

## 2020-09-14 LAB — COMPREHENSIVE METABOLIC PANEL
ALT: 27 U/L (ref 0–35)
AST: 22 U/L (ref 0–37)
Albumin: 4.5 g/dL (ref 3.5–5.2)
Alkaline Phosphatase: 68 U/L (ref 39–117)
BUN: 26 mg/dL — ABNORMAL HIGH (ref 6–23)
CO2: 28 mEq/L (ref 19–32)
Calcium: 9.6 mg/dL (ref 8.4–10.5)
Chloride: 105 mEq/L (ref 96–112)
Creatinine, Ser: 0.79 mg/dL (ref 0.40–1.20)
GFR: 78.89 mL/min (ref >60.00)
Glucose, Bld: 103 mg/dL — ABNORMAL HIGH (ref 70–99)
Potassium: 4.4 mEq/L (ref 3.5–5.1)
Sodium: 141 mEq/L (ref 135–145)
Total Bilirubin: 0.4 mg/dL (ref 0.2–1.2)
Total Protein: 7.1 g/dL (ref 6.0–8.3)

## 2020-09-14 LAB — CBC WITH DIFFERENTIAL/PLATELET
Basophils Absolute: 0.1 10*3/uL (ref 0.0–0.1)
Basophils Relative: 0.9 % (ref 0.0–3.0)
Eosinophils Absolute: 0.2 10*3/uL (ref 0.0–0.7)
Eosinophils Relative: 2.5 % (ref 0.0–5.0)
HCT: 41.6 % (ref 36.0–46.0)
Hemoglobin: 14.4 g/dL (ref 12.0–15.0)
Lymphocytes Relative: 26 % (ref 12.0–46.0)
Lymphs Abs: 1.6 10*3/uL (ref 0.7–4.0)
MCHC: 34.7 g/dL (ref 30.0–36.0)
MCV: 91.6 fl (ref 78.0–100.0)
Monocytes Absolute: 0.3 10*3/uL (ref 0.1–1.0)
Monocytes Relative: 5.7 % (ref 3.0–12.0)
Neutro Abs: 4 10*3/uL (ref 1.4–7.7)
Neutrophils Relative %: 64.9 % (ref 43.0–77.0)
Platelets: 265 10*3/uL (ref 150.0–400.0)
RBC: 4.54 Mil/uL (ref 3.87–5.11)
RDW: 12.7 % (ref 11.5–15.5)
WBC: 6.1 10*3/uL (ref 4.0–10.5)

## 2020-09-14 LAB — LIPID PANEL
Cholesterol: 219 mg/dL — ABNORMAL HIGH (ref 0–200)
HDL: 41.3 mg/dL (ref >39.00)
LDL Cholesterol: 144 mg/dL — ABNORMAL HIGH (ref 0–99)
NonHDL: 177.32
Total CHOL/HDL Ratio: 5
Triglycerides: 168 mg/dL — ABNORMAL HIGH (ref 0.0–149.0)
VLDL: 33.6 mg/dL (ref 0.0–40.0)

## 2020-09-14 LAB — TSH: TSH: 1.79 u[IU]/mL (ref 0.35–4.50)

## 2020-09-14 NOTE — Assessment & Plan Note (Signed)
Chronic Has had multiple arthroscopic surgeries Takes Celebrex as needed

## 2020-09-14 NOTE — Patient Instructions (Addendum)
  Blood work was ordered.     Medications changes include :   none   A referral was ordered for sports medicine       Someone from their office will call you to schedule an appointment.    Please followup in 1 year

## 2020-09-14 NOTE — Assessment & Plan Note (Signed)
History of left-sided breast cancer No evidence of recurrence Mammogram up-to-date

## 2020-09-14 NOTE — Assessment & Plan Note (Signed)
New problem Bilateral hip pain, decreased flexibility No pain with walking-less likely osteoarthritis Possibly muscular in nature Will refer to sports medicine

## 2020-09-16 NOTE — Progress Notes (Signed)
Subjective:    I'm seeing this patient as a consultation for:  Dr. Quay Burow. Note will be routed back to referring provider/PCP.  CC: Bilateral hip stiffness  I, Wendy Poet, LAT, ATC, am serving as scribe for Dr. Lynne Leader.  HPI: Pt is a 65 y/o female presenting w/ B hip stiffness x 6-8 months. Pt reports limited flexibility and is starting to effect her gait/stride length. Pt states limited AROM especially w/ hip ABD and hip flexion. Pt reports she played sports all her life. Pt went through cancer treatment is 2013 and was on tamoxifen. 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 Pt locates pain throughout hip joints. Pt has a PMHx of 7 knee surgeries.   Low back pain: occasionally- "fatty tumors" L4-L5 Radiating pain: no Mechanical hip symptoms: no Aggravating factors: stepping over things Treatments tried: bike, stretching  Diagnostic imaging: L-spine MRI- 07/28/16  Past medical history, Surgical history, Family history, Social history, Allergies, and medications have been entered into the medical record, reviewed.   Review of Systems: No new headache, visual changes, nausea, vomiting, diarrhea, constipation, dizziness, abdominal pain, skin rash, fevers, chills, night sweats, weight loss, swollen lymph nodes, body aches, joint swelling, muscle aches, chest pain, shortness of breath, mood changes, visual or auditory hallucinations.   Objective:    Vitals:   09/19/20 1131  BP: 138/88  Pulse: 70  SpO2: 98%   General: Well Developed, well nourished, and in no acute distress.  Neuro/Psych: Alert and oriented x3, extra-ocular muscles intact, able to move all 4 extremities, sensation grossly intact. Skin: Warm and dry, no rashes noted.  Respiratory: Not using accessory muscles, speaking in full sentences, trachea midline.  Cardiovascular: Pulses palpable, no extremity edema. Abdomen: Does not appear distended. MSK: Right hip normal-appearing Decreased range of  motion to flexion and abduction.  Some pain with internal rotation and abduction. Strength intact within limits of range of motion.  Left hip decreased range of motion to flexion and abduction.  Some pain with internal rotation and abduction.  Strength intact within limits of range of motion.  Lab and Radiology Results  X-ray images bilateral hip obtained today personally and independently interpreted.  Right hip: Moderate DJD with chondrocalcinosis or calcifications present soft tissue. Left hip: Again moderate DJD with chondrocalcinosis or calcifications present in soft tissue.  No fractures visible.  No aggressive appearing bone lesions.  Await formal radiology review  Impression and Recommendations:    Assessment and Plan: 65 y.o. female with bilateral hip pain and lack of range of motion.  Patient's issues are more lack of range of motion and difficulty with gait that pain.  She probably has fixed limits on how much range of motion she can have based on her bony changes seen on x-ray but I believe can have improvement with physical therapy.  She would like to pursue this.  Plan for PT and recheck in about 6 weeks.  Consider trial of diagnostic and possibly therapeutic steroid injection.  Ultimately she may require total hip replacement.  PDMP not reviewed this encounter. Orders Placed This Encounter  Procedures  . DG HIP UNILAT W OR W/O PELVIS 2-3 VIEWS LEFT    Standing Status:   Future    Standing Expiration Date:   09/19/2021    Order Specific Question:   Reason for Exam (SYMPTOM  OR DIAGNOSIS REQUIRED)    Answer:   chronic hip pain    Order Specific Question:  Preferred imaging location?    Answer:   [facility]  . Ambulatory referral to Physical Therapy    Referral Priority:   Routine    Referral Type:   Physical Medicine    Referral Reason:   Specialty Services Required    Requested Specialty:   Physical Therapy    Number of Visits Requested:   1   No orders of  the defined types were placed in this encounter.   Discussed warning signs or symptoms. Please see discharge instructions. Patient expresses understanding.   The above documentation has been reviewed and is accurate and complete Lynne Leader, M.D.

## 2020-09-19 ENCOUNTER — Other Ambulatory Visit: Payer: Self-pay | Admitting: Family Medicine

## 2020-09-19 ENCOUNTER — Ambulatory Visit: Payer: Self-pay

## 2020-09-19 ENCOUNTER — Other Ambulatory Visit: Payer: Self-pay

## 2020-09-19 ENCOUNTER — Ambulatory Visit: Payer: 59 | Admitting: Family Medicine

## 2020-09-19 ENCOUNTER — Ambulatory Visit (INDEPENDENT_AMBULATORY_CARE_PROVIDER_SITE_OTHER): Payer: 59

## 2020-09-19 VITALS — BP 138/88 | HR 70 | Ht 67.0 in | Wt 182.8 lb

## 2020-09-19 DIAGNOSIS — M25559 Pain in unspecified hip: Secondary | ICD-10-CM

## 2020-09-19 NOTE — Patient Instructions (Addendum)
Thank you for coming in today.   Please get an Xray today before you leave   I've referred you to Physical Therapy.  Let us know if you don't hear from them in one week.   Recheck in about 6 weeks.   Let me know if you have a problem.    

## 2020-09-21 NOTE — Progress Notes (Signed)
Bilateral hip x-ray shows medium arthritis of both sides.

## 2020-10-05 NOTE — Progress Notes (Signed)
Subjective:    Patient ID: Emily Knight, female    DOB: 06-Oct-1955, 65 y.o.   MRN: [id number]  HPI The patient is here for an acute visit.   Sore breast - 5-6 months ago she started to have intermittent tenderness in left axilla - recently it has been more perisistent.  She thinks she may feel a lump in the area.     H/o Left breast CA 2013 - s/p lumpectomy with sentinel node dissection, then excision of left axillary LN's.  Chemo and radiation and then tamoxifen x 5 years.    Medications and allergies reviewed with patient and updated if appropriate.  Patient Active Problem List   Diagnosis Date Noted  . History of left breast cancer 09/14/2020  . Hip pain 09/14/2020  . Knee pain, bilateral 09/14/2020  . Trigger thumb of right hand 02/12/2018  . Cerebral aneurysm   . Osteopenia 09/07/2013  . Postmenopausal estrogen deficiency 09/07/2013  . Malignant neoplasm of upper-outer quadrant of left breast in female, estrogen receptor positive ([place]) 06/02/2013  . Lymphedema 01/12/2013  . Hot flashes   . HALLUX VALGUS 11/09/2009    Current Outpatient Medications on File Prior to Visit  Medication Sig Dispense Refill  . Biotin 1000 MCG tablet Take 1,000 mcg by mouth 2 (two) times daily.    . celecoxib (CELEBREX) 200 MG capsule celecoxib 200 mg capsule    . Cholecalciferol (VITAMIN D3) 125 MCG (5000 UT) CAPS Take 5,000 Units by mouth daily.    . Probiotic Product (PROBIOTIC DAILY PO) Take by mouth.    . valACYclovir HCl (VALTREX PO) Take 1 g by mouth. As needed for fever blisters    . venlafaxine XR (EFFEXOR-XR) 37.5 MG 24 hr capsule TAKE 1 CAPSULE BY MOUTH  DAILY WITH BREAKFAST 90 capsule 3   No current facility-administered medications on file prior to visit.    Past Medical History:  Diagnosis Date  . Allergy   . Anxiety   . Arthritis    left knee  . Breast cancer ([place]) 08/14/11 [facility]   Left breast lumpectomy/left axillary lymph node resection,invasive Grade II Lobular  CA in Situ,,left lymph node bx==positive for metastatic lobular ca(2/2)  . Cancer [facility])    left breast  . 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 Cerebral aneurysm    dx by cerebral angiogram,encased in bone  . History of chemotherapy final 02/05/12   4 cycles Adriamycin/Cytoxan and 12 Weeks of Taxol  . Hot flashes   . Hot flushes, perimenopausal   . S/P radiation therapy 02/12/12 -03/26/12   Left Breast, Left Axilla and Left Supraclivcular Region  . Stress fracture    left foot/ankle  . Stress fracture of the metatarsals 11/09/2009   Qualifier: Diagnosis of  By: Oneida Alar MD, KARL    . Use of tamoxifen (Nolvadex) Start - 04/10/12    Past Surgical History:  Procedure Laterality Date  . ABDOMINAL HYSTERECTOMY  1980's   ovaries intact  . AXILLARY LYMPH NODE DISSECTION  08/28/2011   Procedure: AXILLARY LYMPH NODE DISSECTION;  Surgeon: Rolm Bookbinder, MD;  Location: [place];  Service: General;  Laterality: Left;  Left axillary node dissection, port placement  . BREAST LUMPECTOMY  08/14/11   Left breast lumpectomy, left axillary sentinel node biopsy  . left knee reconstruction     six surgeries (3 orthoscopic) 3 open, ACL  . PARTIAL HYSTERECTOMY  1984? 1989?  for fibroids   . PORT-A-CATH REMOVAL  05/29/2012   Procedure: REMOVAL PORT-A-CATH;  Surgeon: Rolm Bookbinder, MD;  Location: [place];  Service: General;  Laterality: N/A;  . PORTACATH PLACEMENT  08/28/2011   Procedure: INSERTION PORT-A-CATH;  Surgeon: Rolm Bookbinder, MD;  Location: [place];  Service: General;  Laterality: Right;  . TONSILLECTOMY AND ADENOIDECTOMY  1971  . TRIGGER FINGER RELEASE  06/29/2008   release A-1 pulley and exc. cyst left thumb    Social History   Socioeconomic History  . Marital status: Married    Spouse name: Not on file  . Number of children: Not on file  . Years of education: Not on file  .  Highest education level: Not on file  Occupational History  . Occupation: Retired Wellsite geologist  Tobacco Use  . Smoking status: Former Smoker    Packs/day: 1.00    Years: 15.00    Pack years: 15.00    Types: Cigarettes    Quit date: 01/23/2001    Years since quitting: 19.7  . Smokeless tobacco: Never Used  . Tobacco comment: quit smoking 10 yrs. ago  Substance and Sexual Activity  . Alcohol use: Yes    Comment: occasionally  . Drug use: No    Comment: quit smoking 2002, smoked for 10 quit restarted for 5 more  . Sexual activity: Not on file    Comment: hrt 1 year    Other Topics Concern  . Not on file  Social History Narrative   Retired Engineer, structural x 30 yrs, single   Social Determinants of Radio broadcast assistant Strain: Not on [employer] Insecurity: Not on file  Transportation Needs: Not on file  Physical Activity: Not on file  Stress: Not on file  Social Connections: Not on file    Family History  Problem Relation Age of Onset  . Anesthesia problems Mother        post-op N/V  . Cancer Father 36       lung - ages 66 and 29; prostate  . Cancer Maternal Aunt 58       ovarian  . Cancer Paternal Aunt        lung  . Cancer Paternal Uncle        bone cancer  . Melanoma Cousin 36       female ,skin    Review of Systems     Objective:   Vitals:   10/06/20 0809  BP: 126/82  Pulse: 71  Temp: 98.2 F (36.8 C)  SpO2: 98%   BP Readings from Last 3 Encounters:  10/06/20 126/82  09/19/20 138/88  09/14/20 122/78   Wt Readings from Last 3 Encounters:  10/06/20 181 lb (82.1 kg)  09/19/20 182 lb 12.8 oz (82.9 kg)  09/14/20 183 lb (83 kg)   Body mass index is 28.35 kg/m.   Physical Exam Constitutional:      General: She is not in acute distress.    Appearance: Normal appearance. She is not ill-appearing.  HENT:     Head: Normocephalic and atraumatic.  Chest:     Chest wall: No mass or tenderness.  Breasts:     Left: Axillary adenopathy  (tenderness at medial aspect of axillary scar with possible palpable LN) present. No swelling, inverted nipple, mass, nipple discharge, skin change (scar from lumpectomy lateral lower breast) or tenderness.    Lymphadenopathy:     Upper Body:     Left upper body:  Axillary adenopathy (tenderness at medial aspect of axillary scar with possible palpable LN) present.  Skin:    General: Skin is warm and dry.  Neurological:     Mental Status: She is alert.            Assessment & Plan:    See Problem List for Assessment and Plan of chronic medical problems.    This visit occurred during the SARS-CoV-2 public health emergency.  Safety protocols were in place, including screening questions prior to the visit, additional usage of staff PPE, and extensive cleaning of exam room while observing appropriate contact time as indicated for disinfecting solutions.

## 2020-10-06 ENCOUNTER — Encounter: Payer: Self-pay | Admitting: Internal Medicine

## 2020-10-06 ENCOUNTER — Ambulatory Visit: Payer: 59 | Admitting: Internal Medicine

## 2020-10-06 ENCOUNTER — Other Ambulatory Visit: Payer: Self-pay

## 2020-10-06 VITALS — BP 126/82 | HR 71 | Temp 98.2°F | Ht 67.0 in | Wt 181.0 lb

## 2020-10-06 DIAGNOSIS — Z853 Personal history of malignant neoplasm of breast: Secondary | ICD-10-CM

## 2020-10-06 DIAGNOSIS — M79622 Pain in left upper arm: Secondary | ICD-10-CM

## 2020-10-06 NOTE — Patient Instructions (Signed)
A mammogram and ultrasound was ordered for Emily Knight.

## 2020-10-06 NOTE — Assessment & Plan Note (Signed)
Acute Tenderness and possible palpable LN along scar in Left axilla - concerning for recurrence of breast cancer Diagnostic mammo and breast US inc axilla ordered stat for [facility]

## 2020-10-31 ENCOUNTER — Ambulatory Visit: Payer: 59 | Admitting: Family Medicine

## 2020-11-15 ENCOUNTER — Encounter: Payer: Self-pay | Admitting: Internal Medicine

## 2020-11-15 LAB — HM MAMMOGRAPHY

## 2020-12-02 ENCOUNTER — Encounter: Payer: Self-pay | Admitting: Internal Medicine

## 2020-12-02 NOTE — Progress Notes (Unsigned)
Outside notes received. Information abstracted. Notes sent to scan.  

## 2021-04-14 ENCOUNTER — Other Ambulatory Visit: Payer: Self-pay | Admitting: Nurse Practitioner

## 2021-08-04 ENCOUNTER — Other Ambulatory Visit: Payer: Self-pay

## 2021-08-04 ENCOUNTER — Emergency Department (HOSPITAL_BASED_OUTPATIENT_CLINIC_OR_DEPARTMENT_OTHER): Payer: Medicare Other

## 2021-08-04 ENCOUNTER — Emergency Department (HOSPITAL_BASED_OUTPATIENT_CLINIC_OR_DEPARTMENT_OTHER)
Admission: EM | Admit: 2021-08-04 | Discharge: 2021-08-04 | Disposition: A | Discharge status: Home / Self Care | Payer: Medicare Other | Attending: Emergency Medicine | Admitting: Emergency Medicine

## 2021-08-04 ENCOUNTER — Encounter (HOSPITAL_BASED_OUTPATIENT_CLINIC_OR_DEPARTMENT_OTHER): Payer: Self-pay | Admitting: *Deleted

## 2021-08-04 DIAGNOSIS — M79604 Pain in right leg: Secondary | ICD-10-CM | POA: Insufficient documentation

## 2021-08-04 DIAGNOSIS — R609 Edema, unspecified: Secondary | ICD-10-CM

## 2021-08-04 NOTE — ED Notes (Signed)
Pt is back for DVT study.  [place] [facility] is aware that they are back

## 2021-08-04 NOTE — ED Notes (Signed)
Pt will return at 1600 for venous doppler. Leave pt OTF so she will not incur second facility charge

## 2021-08-04 NOTE — ED Provider Notes (Signed)
[facility] Provider Note   CSN: [id number] Arrival date & time: 08/04/21  1108     History  Chief Complaint  Patient presents with   Leg Pain    Emily Knight is a 66 y.o. female.  Patient is a 66 year old female presenting for leg pain. Pt admits to right sided leg pain behind the knee that started.. Denies redness, swelling, or wounds. Denies falls or trauma. Denies hx of DVT/Pes. 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   The history is provided by the patient. No language interpreter was used.  Leg Pain Associated symptoms: no back pain and no fever       Home Medications Prior to Admission medications   Medication Sig Start Date End Date Taking? Authorizing Provider  Biotin 1000 MCG tablet Take 1,000 mcg by mouth 2 (two) times daily.    [provider]  celecoxib (CELEBREX) 200 MG capsule celecoxib 200 mg capsule    [provider]  Cholecalciferol (VITAMIN D3) 125 MCG (5000 UT) CAPS Take 5,000 Units by mouth daily.    [provider]  Probiotic Product (PROBIOTIC DAILY PO) Take by mouth.    [provider]  valACYclovir HCl (VALTREX PO) Take 1 g by mouth. As needed for fever blisters    [provider]  venlafaxine XR (EFFEXOR-XR) 37.5 MG 24 hr capsule TAKE 1 CAPSULE BY MOUTH  DAILY WITH BREAKFAST 09/15/20   Magrinat, Virgie Dad, MD      Allergies    Adhesive [tape] and Codeine    Review of Systems   Review of Systems  Constitutional:  Negative for chills and fever.  HENT:  Negative for ear pain and sore throat.   Eyes:  Negative for pain and visual disturbance.  Respiratory:  Negative for cough and shortness of breath.   Cardiovascular:  Negative for chest pain and palpitations.  Gastrointestinal:  Negative for abdominal pain and vomiting.  Genitourinary:  Negative for dysuria and hematuria.  Musculoskeletal:  Negative for arthralgias and back pain.  Skin:  Negative  for color change and rash.  Neurological:  Negative for seizures and syncope.  All other systems reviewed and are negative.  Physical Exam Updated Vital Signs BP (!) 158/95    Pulse 70    Temp 97.7 F (36.5 C)    Resp 18    SpO2 97%  Physical Exam Vitals and nursing note reviewed.  Constitutional:      Appearance: Normal appearance.  HENT:     Head: Normocephalic and atraumatic.  Cardiovascular:     Rate and Rhythm: Normal rate and regular rhythm.     Pulses:          Dorsalis pedis pulses are 2+ on the right side and 2+ on the left side.  Pulmonary:     Effort: Pulmonary effort is normal. No respiratory distress.  Musculoskeletal:     Right knee: Tenderness present.     Left knee: Normal.     Comments: Tenderness in popliteal region of right leg. No swelling, wounds, erythema, or swelling on exam. Normal DP pulses. No sensation or motor deficits.     Skin:    Capillary Refill: Capillary refill takes less than 2 seconds.  Neurological:     General: No focal deficit present.     Mental Status: She is alert and oriented to person, place, and time.     GCS: GCS eye subscore is  4. GCS verbal subscore is 5. GCS motor subscore is 6.     Sensory: Sensation is intact.     Motor: Motor function is intact.    ED Results / Procedures / Treatments   Labs (all labs ordered are listed, but only abnormal results are displayed) Labs Reviewed - No data to display  EKG None  Radiology US Venous Img Lower Unilateral Right (DVT)  Result Date: 08/04/2021 CLINICAL DATA:  Right posterior knee pain and swelling. EXAM: RIGHT LOWER EXTREMITY VENOUS DOPPLER ULTRASOUND TECHNIQUE: Sheronda Parran-scale sonography with compression, as well as color and duplex ultrasound, were performed to evaluate the deep venous system(s) from the level of the common femoral vein through the popliteal and proximal calf veins. COMPARISON:  None. FINDINGS: VENOUS Normal compressibility of the common femoral, superficial femoral,  and popliteal veins, as well as the visualized calf veins. Visualized portions of profunda femoral vein and great saphenous vein unremarkable. No filling defects to suggest DVT on grayscale or color Doppler imaging. Doppler waveforms show normal direction of venous flow, normal respiratory plasticity and response to augmentation. Limited views of the contralateral common femoral vein are unremarkable. OTHER None. Limitations: none IMPRESSION: No evidence of right lower extremity deep venous thrombosis. Electronically Signed   By: Yvonne Kendall M.D.   On: 08/04/2021 20:00    Procedures Procedures    Medications Ordered in ED Medications - No data to display  ED Course/ Medical Decision Making/ A&P                           Medical Decision Making Amount and/or Complexity of Data Reviewed ECG/medicine tests: ordered.   8:25 PM Patient is a 66 year old female presenting for leg pain. Tenderness in popliteal region of right leg. No swelling, wounds, erythema, or swelling on exam. Leg neurovascularly intact.   Considered bakers cyst but none palpated. Considered DVT but US demonstrates no DVT. Considered bony injury but no bony tenderness or hx of fall/injuries. No ligamentous laxity or joint pain. Compartments soft. Doubt compartment syndrome Pt in minimal discomfort. Doubt nec fash.   No clear diagnosis for patient's pain at this time. She states she has been on the road a lot lately driving her car. Considered compression injury.  Patient in no distress and overall condition improved here in the ED. Detailed discussions were had with the patient regarding current findings, and need for close f/u with PCP or on call doctor. The patient has been instructed to return immediately if the symptoms worsen in any way for re-evaluation. Patient verbalized understanding and is in agreement with current care plan. All questions answered prior to discharge.         Final Clinical  Impression(s) / ED Diagnoses Final diagnoses:  No swelling  Right leg pain    Rx / DC Orders ED Discharge Orders     None         Lianne Cure, DO 04/59/97 1042

## 2021-08-04 NOTE — ED Triage Notes (Signed)
Right leg pain and concerned about blood clot.  Recently drove 8 hours.

## 2021-09-04 ENCOUNTER — Telehealth: Payer: Self-pay

## 2021-09-04 NOTE — Telephone Encounter (Signed)
Called patient regarding a refill request we received for Venlafaxine XR. I informed the patient that we would no longer be able to refill that medication here at the [facility], and that she would need to follow-up with her PCP. The patient verbalized understanding of this. I also informed her that we could call in a courtesy refill until she is able to be seen by her PCP, but she assured me she still had plenty until her next appointment. Patient had no further questions or concerns.  ?

## 2021-09-11 ENCOUNTER — Encounter: Payer: Self-pay | Admitting: Internal Medicine

## 2021-09-11 NOTE — Progress Notes (Signed)
? ? ?Subjective:  ? ? Patient ID: Emily Knight, female    DOB: 01-Apr-1956, 66 y.o.   MRN: [id number] ? ? ?This visit occurred during the SARS-CoV-2 public health emergency.  Safety protocols were in place, including screening questions prior to the visit, additional usage of staff PPE, and extensive cleaning of exam room while observing appropriate contact time as indicated for disinfecting solutions. ? ? ? ?HPI ?Jayden is here for  ?Chief Complaint  ?Patient presents with  ? Annual Exam  ? ? ?Hips still hurt - dec ROM laterally.  Leg cramping mostly in the summer - does not always drink enough fluids.  ? ?Overall feels good.   ? ? ?Medications and allergies reviewed with patient and updated if appropriate. ? ? ? ?Current Outpatient Medications on File Prior to Visit  ?Medication Sig Dispense Refill  ? Biotin 1000 MCG tablet Take 1,000 mcg by mouth 2 (two) times daily.    ? Cholecalciferol (VITAMIN D3) 125 MCG (5000 UT) CAPS Take 5,000 Units by mouth daily.    ? Probiotic Product (PROBIOTIC DAILY PO) Take by mouth.    ? ?No current facility-administered medications on file prior to visit.  ? ? ?Review of Systems  ?Constitutional:  Negative for fever.  ?Eyes:  Negative for visual disturbance.  ?Respiratory:  Negative for cough, shortness of breath and wheezing.   ?Cardiovascular:  Positive for leg swelling (left ankle -). Negative for chest pain and palpitations.  ?Gastrointestinal:  Negative for abdominal pain, blood in stool, constipation, diarrhea and nausea.  ?Genitourinary:  Negative for dysuria.  ?Musculoskeletal:  Positive for arthralgias (hips) and back pain.  ?Skin:  Negative for rash.  ?Neurological:  Positive for headaches (occ). Negative for light-headedness.  ?Psychiatric/Behavioral:  Negative for dysphoric mood. The patient is not nervous/anxious.   ? ?   ?Objective:  ? ?Vitals:  ? 09/12/21 0914  ?BP: 128/78  ?Pulse: 72  ?Temp: 98.2 ?F (36.8 ?C)  ?SpO2: 99%  ? ?Filed Weights  ? 09/12/21 0914  ?Weight:  182 lb (82.6 kg)  ? ?Body mass index is 28.51 kg/m?. ? ?BP Readings from Last 3 Encounters:  ?09/12/21 128/78  ?08/04/21 (!) 158/95  ?10/06/20 126/82  ? ? ?Wt Readings from Last 3 Encounters:  ?09/12/21 182 lb (82.6 kg)  ?10/06/20 181 lb (82.1 kg)  ?09/19/20 182 lb 12.8 oz (82.9 kg)  ? ? ? ?  09/14/2020  ?  9:46 AM  ?Depression screen PHQ 2/9  ?Decreased Interest 0  ?Down, Depressed, Hopeless 0  ?PHQ - 2 Score 0  ? ? ? ?   ? View : No data to display.  ?  ?  ?  ? ? ? ? ?  ?Physical Exam ?Constitutional: She appears well-developed and well-nourished. No distress.  ?HENT:  ?Head: Normocephalic and atraumatic.  ?Right Ear: External ear normal. Normal ear canal and TM ?Left Ear: External ear normal.  Normal ear canal and TM ?Mouth/Throat: Oropharynx is clear and moist.  ?Eyes: Conjunctivae and EOM are normal.  ?Neck: Neck supple. No tracheal deviation present. No thyromegaly present.  ?No carotid bruit  ?Cardiovascular: Normal rate, regular rhythm and normal heart sounds.   ?No murmur heard.  No edema. ?Pulmonary/Chest: Effort normal and breath sounds normal. No respiratory distress. She has no wheezes. She has no rales.  ?Breast: deferred   ?Abdominal: Soft. She exhibits no distension. There is no tenderness.  ?Lymphadenopathy: She has no cervical adenopathy.  ?Skin: Skin is warm and dry. She is  not diaphoretic.  ?Psychiatric: She has a normal mood and affect. Her behavior is normal.  ? ? ? ?Lab Results  ?Component Value Date  ? WBC 6.1 09/14/2020  ? HGB 14.4 09/14/2020  ? HCT 41.6 09/14/2020  ? PLT 265.0 09/14/2020  ? GLUCOSE 103 (H) 09/14/2020  ? CHOL 219 (H) 09/14/2020  ? TRIG 168.0 (H) 09/14/2020  ? HDL 41.30 09/14/2020  ? LDLCALC 144 (H) 09/14/2020  ? ALT 27 09/14/2020  ? AST 22 09/14/2020  ? NA 141 09/14/2020  ? K 4.4 09/14/2020  ? CL 105 09/14/2020  ? CREATININE 0.79 09/14/2020  ? BUN 26 (H) 09/14/2020  ? CO2 28 09/14/2020  ? TSH 1.79 09/14/2020  ? INR 0.93 09/02/2014  ? ? ? ? ?   ?Assessment & Plan:  ? ?Physical  exam: ?Screening blood work  ordered ?Exercise  very active - more routine exercise in summer ?Weight  ok for age ?Substance abuse  none ? ? ?Reviewed recommended immunizations. ? ? ?Health Maintenance  ?Topic Date Due  ? COLONOSCOPY (Pts 45-37yr Insurance coverage will need to be confirmed)  Never done  ? DEXA SCAN  02/09/2017  ? TETANUS/TDAP  09/14/2021 (Originally [birth year]31/1974)  ? INFLUENZA VACCINE  09/15/2021 (Originally 01/16/2021)  ? COVID-19 Vaccine (5 - Booster for [employer]onseries) 09/28/2021 (Originally 05/05/2021)  ? Zoster Vaccines- Shingrix (1 of 2) 12/13/2021 (Originally [birth year]/31/1974)  ? Pneumonia Vaccine 66 Years old (1 - PCV) 09/13/2022 (Originally 12/30/1961)  ? MAMMOGRAM  11/16/2022  ? HPV VACCINES  Aged Out  ? PAP SMEAR-Modifier  Discontinued  ? Hepatitis C Screening  Discontinued  ? HIV Screening  Discontinued  ?  ? ? ? ? ? ? ?See Problem List for Assessment and Plan of chronic medical problems. ? ? ? ? ?

## 2021-09-11 NOTE — Patient Instructions (Addendum)
? ? ? ?Blood work was ordered.   ? ? ?Medications changes include :   none ? ? ?Your prescription(s) have been sent to your pharmacy.  ? ? ?Return in about 1 year (around 09/13/2022) for CPE. ? ? ? ?Health Maintenance, Female ?Adopting a healthy lifestyle and getting preventive care are important in promoting health and wellness. Ask your health care provider about: ?The right schedule for you to have regular tests and exams. ?Things you can do on your own to prevent diseases and keep yourself healthy. ?What should I know about diet, weight, and exercise? ?Eat a healthy diet ? ?Eat a diet that includes plenty of vegetables, fruits, low-fat dairy products, and lean protein. ?Do not eat a lot of foods that are high in solid fats, added sugars, or sodium. ?Maintain a healthy weight ?Body mass index (BMI) is used to identify weight problems. It estimates body fat based on height and weight. Your health care provider can help determine your BMI and help you achieve or maintain a healthy weight. ?Get regular exercise ?Get regular exercise. This is one of the most important things you can do for your health. Most adults should: ?Exercise for at least 150 minutes each week. The exercise should increase your heart rate and make you sweat (moderate-intensity exercise). ?Do strengthening exercises at least twice a week. This is in addition to the moderate-intensity exercise. ?Spend less time sitting. Even light physical activity can be beneficial. ?Watch cholesterol and blood lipids ?Have your blood tested for lipids and cholesterol at 66 years of age, then have this test every 5 years. ?Have your cholesterol levels checked more often if: ?Your lipid or cholesterol levels are high. ?You are older than 66 years of age. ?You are at high risk for heart disease. ?What should I know about cancer screening? ?Depending on your health history and family history, you may need to have cancer screening at various ages. This may include  screening for: ?Breast cancer. ?Cervical cancer. ?Colorectal cancer. ?Skin cancer. ?Lung cancer. ?What should I know about heart disease, diabetes, and high blood pressure? ?Blood pressure and heart disease ?High blood pressure causes heart disease and increases the risk of stroke. This is more likely to develop in people who have high blood pressure readings or are overweight. ?Have your blood pressure checked: ?Every 3-5 years if you are 32-75 years of age. ?Every year if you are 70 years old or older. ?Diabetes ?Have regular diabetes screenings. This checks your fasting blood sugar level. Have the screening done: ?Once every three years after age 74 if you are at a normal weight and have a low risk for diabetes. ?More often and at a younger age if you are overweight or have a high risk for diabetes. ?What should I know about preventing infection? ?Hepatitis B ?If you have a higher risk for hepatitis B, you should be screened for this virus. Talk with your health care provider to find out if you are at risk for hepatitis B infection. ?Hepatitis C ?Testing is recommended for: ?Everyone born from [birth year] through 1965. ?Anyone with known risk factors for hepatitis C. ?Sexually transmitted infections (STIs) ?Get screened for STIs, including gonorrhea and chlamydia, if: ?You are sexually active and are younger than 66 years of age. ?You are older than 66 years of age and your health care provider tells you that you are at risk for this type of infection. ?Your sexual activity has changed since you were last screened, and you are at  increased risk for chlamydia or gonorrhea. Ask your health care provider if you are at risk. ?Ask your health care provider about whether you are at high risk for HIV. Your health care provider may recommend a prescription medicine to help prevent HIV infection. If you choose to take medicine to prevent HIV, you should first get tested for HIV. You should then be tested every 3 months for as  long as you are taking the medicine. ?Pregnancy ?If you are about to stop having your period (premenopausal) and you may become pregnant, seek counseling before you get pregnant. ?Take 400 to 800 micrograms (mcg) of folic acid every day if you become pregnant. ?Ask for birth control (contraception) if you want to prevent pregnancy. ?Osteoporosis and menopause ?Osteoporosis is a disease in which the bones lose minerals and strength with aging. This can result in bone fractures. If you are 66 years old or older, or if you are at risk for osteoporosis and fractures, ask your health care provider if you should: ?Be screened for bone loss. ?Take a calcium or vitamin D supplement to lower your risk of fractures. ?Be given hormone replacement therapy (HRT) to treat symptoms of menopause. ?Follow these instructions at home: ?Alcohol use ?Do not drink alcohol if: ?Your health care provider tells you not to drink. ?You are pregnant, may be pregnant, or are planning to become pregnant. ?If you drink alcohol: ?Limit how much you have to: ?0-1 drink a day. ?Know how much alcohol is in your drink. In the [place] one drink equals one 12 oz bottle of beer (355 mL), one 5 oz glass of wine (148 mL), or one 1? oz glass of hard liquor (44 mL). ?Lifestyle ?Do not use any products that contain nicotine or tobacco. These products include cigarettes, chewing tobacco, and vaping devices, such as e-cigarettes. If you need help quitting, ask your health care provider. ?Do not use street drugs. ?Do not share needles. ?Ask your health care provider for help if you need support or information about quitting drugs. ?General instructions ?Schedule regular health, dental, and eye exams. ?Stay current with your vaccines. ?Tell your health care provider if: ?You often feel depressed. ?You have ever been abused or do not feel safe at home. ?Summary ?Adopting a healthy lifestyle and getting preventive care are important in promoting health and  wellness. ?Follow your health care provider's instructions about healthy diet, exercising, and getting tested or screened for diseases. ?Follow your health care provider's instructions on monitoring your cholesterol and blood pressure. ?This information is not intended to replace advice given to you by your health care provider. Make sure you discuss any questions you have with your health care provider. ?Document Revised: 10/24/2020 Document Reviewed: 10/24/2020 ?Elsevier Patient Education ? [place]. ? ?

## 2021-09-12 ENCOUNTER — Ambulatory Visit (INDEPENDENT_AMBULATORY_CARE_PROVIDER_SITE_OTHER): Payer: Medicare Other | Admitting: Internal Medicine

## 2021-09-12 VITALS — BP 128/78 | HR 72 | Temp 98.2°F | Ht 67.0 in | Wt 182.0 lb

## 2021-09-12 DIAGNOSIS — M8580 Other specified disorders of bone density and structure, unspecified site: Secondary | ICD-10-CM | POA: Diagnosis not present

## 2021-09-12 DIAGNOSIS — Z136 Encounter for screening for cardiovascular disorders: Secondary | ICD-10-CM

## 2021-09-12 DIAGNOSIS — G622 Polyneuropathy due to other toxic agents: Secondary | ICD-10-CM

## 2021-09-12 DIAGNOSIS — Z0000 Encounter for general adult medical examination without abnormal findings: Secondary | ICD-10-CM | POA: Diagnosis not present

## 2021-09-12 LAB — LIPID PANEL
Cholesterol: 254 mg/dL — ABNORMAL HIGH (ref 0–200)
HDL: 42.9 mg/dL (ref >39.00)
LDL Cholesterol: 178 mg/dL — ABNORMAL HIGH (ref 0–99)
NonHDL: 210.96
Total CHOL/HDL Ratio: 6
Triglycerides: 165 mg/dL — ABNORMAL HIGH (ref 0.0–149.0)
VLDL: 33 mg/dL (ref 0.0–40.0)

## 2021-09-12 LAB — CBC WITH DIFFERENTIAL/PLATELET
Basophils Absolute: 0 10*3/uL (ref 0.0–0.1)
Basophils Relative: 0.7 % (ref 0.0–3.0)
Eosinophils Absolute: 0.1 10*3/uL (ref 0.0–0.7)
Eosinophils Relative: 1.9 % (ref 0.0–5.0)
HCT: 42.1 % (ref 36.0–46.0)
Hemoglobin: 14.6 g/dL (ref 12.0–15.0)
Lymphocytes Relative: 24.9 % (ref 12.0–46.0)
Lymphs Abs: 1.6 10*3/uL (ref 0.7–4.0)
MCHC: 34.7 g/dL (ref 30.0–36.0)
MCV: 91.2 fl (ref 78.0–100.0)
Monocytes Absolute: 0.3 10*3/uL (ref 0.1–1.0)
Monocytes Relative: 5.2 % (ref 3.0–12.0)
Neutro Abs: 4.2 10*3/uL (ref 1.4–7.7)
Neutrophils Relative %: 67.3 % (ref 43.0–77.0)
Platelets: 260 10*3/uL (ref 150.0–400.0)
RBC: 4.62 Mil/uL (ref 3.87–5.11)
RDW: 12.3 % (ref 11.5–15.5)
WBC: 6.3 10*3/uL (ref 4.0–10.5)

## 2021-09-12 LAB — COMPREHENSIVE METABOLIC PANEL
ALT: 32 U/L (ref 0–35)
AST: 27 U/L (ref 0–37)
Albumin: 4.8 g/dL (ref 3.5–5.2)
Alkaline Phosphatase: 74 U/L (ref 39–117)
BUN: 21 mg/dL (ref 6–23)
CO2: 26 mEq/L (ref 19–32)
Calcium: 10.5 mg/dL (ref 8.4–10.5)
Chloride: 102 mEq/L (ref 96–112)
Creatinine, Ser: 0.82 mg/dL (ref 0.40–1.20)
GFR: 74.92 mL/min (ref >60.00)
Glucose, Bld: 99 mg/dL (ref 70–99)
Potassium: 4.2 mEq/L (ref 3.5–5.1)
Sodium: 135 mEq/L (ref 135–145)
Total Bilirubin: 0.6 mg/dL (ref 0.2–1.2)
Total Protein: 7.6 g/dL (ref 6.0–8.3)

## 2021-09-12 LAB — TSH: TSH: 2.39 u[IU]/mL (ref 0.35–5.50)

## 2021-09-12 LAB — VITAMIN D 25 HYDROXY (VIT D DEFICIENCY, FRACTURES): VITD: 80.75 ng/mL (ref 30.00–100.00)

## 2021-09-12 MED ORDER — CELECOXIB 200 MG PO CAPS: 200.0000 mg | ORAL_CAPSULE | Freq: Every day | ORAL | 3 refills | Status: DC

## 2021-09-12 MED ORDER — VENLAFAXINE HCL ER 37.5 MG PO CP24: 37.5000 mg | ORAL_CAPSULE | Freq: Every day | ORAL | 3 refills | Status: DC

## 2021-09-12 NOTE — Assessment & Plan Note (Signed)
Chronic ?Fingers, toes ?Burning sensation, decreased proprioception ?Taking B12  ?

## 2021-09-12 NOTE — Assessment & Plan Note (Signed)
Chronic ?DEXA due-ordered ?Encourage regular exercise ?Continue vitamin D-check vitamin D level ?

## 2021-09-18 ENCOUNTER — Encounter: Payer: Self-pay | Admitting: Internal Medicine

## 2021-09-19 MED ORDER — SEMAGLUTIDE-WEIGHT MANAGEMENT 0.25 MG/0.5ML ~~LOC~~ SOAJ: 0.2500 mg | SUBCUTANEOUS | 0 refills | Status: DC

## 2021-09-21 MED ORDER — TIRZEPATIDE 2.5 MG/0.5ML ~~LOC~~ SOAJ: 2.5000 mg | SUBCUTANEOUS | 0 refills | Status: DC

## 2021-09-21 NOTE — Addendum Note (Signed)
Addended by: Binnie Rail on: 09/21/2021 08:25 AM ? ? Modules accepted: Orders ? ?

## 2021-09-28 ENCOUNTER — Telehealth: Payer: Self-pay

## 2021-09-28 NOTE — Telephone Encounter (Signed)
Lennox Solders (Key: Joanie Coddington) ? ?Your information has been sent to [facility]. ? ?[place] ?

## 2021-10-13 ENCOUNTER — Encounter: Payer: Self-pay | Admitting: Internal Medicine

## 2021-10-16 ENCOUNTER — Other Ambulatory Visit: Payer: Self-pay | Admitting: Internal Medicine

## 2021-10-24 ENCOUNTER — Other Ambulatory Visit: Payer: Self-pay | Admitting: Internal Medicine

## 2021-10-24 MED ORDER — TIRZEPATIDE 5 MG/0.5ML ~~LOC~~ SOAJ: 5.0000 mg | SUBCUTANEOUS | 2 refills | Status: DC

## 2021-11-16 LAB — HM MAMMOGRAPHY

## 2021-11-17 ENCOUNTER — Encounter: Payer: Self-pay | Admitting: Internal Medicine

## 2021-11-17 MED ORDER — TIRZEPATIDE 7.5 MG/0.5ML ~~LOC~~ SOAJ: 7.5000 mg | SUBCUTANEOUS | 0 refills | Status: DC

## 2021-11-27 ENCOUNTER — Telehealth: Payer: Self-pay

## 2021-11-27 NOTE — Telephone Encounter (Signed)
Pt is calling to get the Bone Density order faxed over to [facility].  I faxed the order to the requested fax number [phone].  FYI

## 2021-12-01 ENCOUNTER — Encounter: Payer: Self-pay | Admitting: Internal Medicine

## 2021-12-06 ENCOUNTER — Other Ambulatory Visit: Payer: Self-pay | Admitting: Internal Medicine

## 2021-12-15 ENCOUNTER — Other Ambulatory Visit: Payer: Self-pay

## 2021-12-15 ENCOUNTER — Encounter: Payer: Self-pay | Admitting: Internal Medicine

## 2021-12-15 MED ORDER — MOUNJARO 7.5 MG/0.5ML ~~LOC~~ SOAJ: SUBCUTANEOUS | 11 refills | Status: DC

## 2021-12-20 ENCOUNTER — Encounter: Payer: Self-pay | Admitting: Internal Medicine

## 2021-12-20 NOTE — Progress Notes (Signed)
Outside notes received. Information abstracted. Notes sent to scan.  

## 2022-01-10 ENCOUNTER — Encounter: Payer: Self-pay | Admitting: Internal Medicine

## 2022-02-23 ENCOUNTER — Encounter: Payer: Self-pay | Admitting: Family Medicine

## 2022-02-23 ENCOUNTER — Ambulatory Visit: Payer: Medicare Other | Admitting: Family Medicine

## 2022-02-23 VITALS — BP 120/70 | HR 66 | Temp 97.6°F | Ht 67.0 in | Wt 160.0 lb

## 2022-02-23 DIAGNOSIS — J029 Acute pharyngitis, unspecified: Secondary | ICD-10-CM | POA: Diagnosis not present

## 2022-02-23 DIAGNOSIS — R051 Acute cough: Secondary | ICD-10-CM | POA: Diagnosis not present

## 2022-02-23 LAB — POC COVID19 BINAXNOW: SARS Coronavirus 2 Ag: NEGATIVE

## 2022-02-23 LAB — POCT RAPID STREP A (OFFICE): Rapid Strep A Screen: NEGATIVE

## 2022-02-23 MED ORDER — BENZONATATE 200 MG PO CAPS: 200.0000 mg | ORAL_CAPSULE | Freq: Two times a day (BID) | ORAL | 0 refills | Status: DC | PRN

## 2022-02-23 MED ORDER — AMOXICILLIN 875 MG PO TABS: 875.0000 mg | ORAL_TABLET | Freq: Two times a day (BID) | ORAL | 0 refills | Status: AC

## 2022-02-23 NOTE — Patient Instructions (Signed)
You are negative for COVID and strep throat today.  You appear to have a viral illness so I recommend continuing with symptomatic treatment including salt water gargles, Tylenol or ibuprofen and Mucinex (for thick mucus) as needed.  If you are not feeling significantly better in the next 24 to 48 hours or if you develop a fever or are getting worse then start the antibiotic.  I also sent in a cough medication called Tessalon Perles

## 2022-02-23 NOTE — Progress Notes (Signed)
Subjective:  Emily Knight is a 66 y.o. female who presents for a 5 day hx of sore throat. Recently developed dry cough and symptoms preceded by fatigue and body aches 4-5 days ago.  Negative Covid tests at home.  Denies dizziness, headaches, rhinorrhea, nasal congestion, ear pain, sinus pain, chest pain, palpitations, shortness of breath, abdominal pain, N/V/D.   Treatment to date: Tylenol and Nyquil  Denies sick contacts.  No other aggravating or relieving factors.  No other c/o.  ROS as in subjective.   Objective: Vitals:   02/23/22 0850  BP: 120/70  Pulse: 66  Temp: 97.6 F (36.4 C)  SpO2: 95%    General appearance: Alert, WD/WN, no distress, mildly ill appearing                             Skin: warm, no rash                           Head: no sinus tenderness                            Eyes: conjunctiva normal, corneas clear, PERRLA                            Ears: pearly TMs, external ear canals normal                          Nose: septum midline, turbinates swollen, with erythema and clear discharge             Mouth/throat: MMM, tongue normal, pharyngeal erythema without edema or exudate                            Neck: supple, right anterior cervical adenopath with TTP, no thyromegaly                          Heart: RRR                         Lungs: CTA bilaterally, no wheezes, rales, or rhonchi      Assessment: Acute pharyngitis, unspecified etiology - Plan: amoxicillin (AMOXIL) 875 MG tablet, POCT rapid strep A, POC COVID-19  Acute cough - Plan: amoxicillin (AMOXIL) 875 MG tablet, benzonatate (TESSALON) 200 MG capsule, POCT rapid strep A, POC COVID-19   Plan: Negative Covid test Negative strep test  Discussed diagnosis and treatment of viral illness. If she is not improving significantly in the next 1-2 days, she will start antibiotic Amoxicillin. Tessalon perles prescribed.  Suggested symptomatic OTC remedies. Salt water gargles. Tylenol or Ibuprofen OTC  prn. Call/return as needed.

## 2022-05-21 ENCOUNTER — Ambulatory Visit (INDEPENDENT_AMBULATORY_CARE_PROVIDER_SITE_OTHER): Payer: Medicare Other

## 2022-05-21 VITALS — Ht 67.0 in | Wt 155.6 lb

## 2022-05-21 DIAGNOSIS — Z0000 Encounter for general adult medical examination without abnormal findings: Secondary | ICD-10-CM | POA: Diagnosis not present

## 2022-05-21 NOTE — Progress Notes (Signed)
Virtual Visit via Telephone Note  I connected with  Emily Knight on 05/21/22 at  3:00 PM EST by telephone and verified that I am speaking with the correct person using two identifiers.  Location: Patient: Home Provider: [place] Persons participating in the virtual visit: [place]   I discussed the limitations, risks, security and privacy concerns of performing an evaluation and management service by telephone and the availability of in person appointments. The patient expressed understanding and agreed to proceed.  Interactive audio and video telecommunications were attempted between this nurse and patient, however failed, due to patient having technical difficulties OR patient did not have access to video capability.  We continued and completed visit with audio only.  Some vital signs may be absent or patient reported.   Sheral Flow, LPN  Subjective:   Emily Knight is a 66 y.o. female who presents for an Initial Medicare Annual Wellness Visit.  Review of Systems     Cardiac Risk Factors include: advanced age (>13mn, >>70women)     Objective:    Today's Vitals   05/21/22 1513  Weight: 155 lb 9.6 oz (70.6 kg)  Height: '5\' 7"'$  (1.702 m)  PainSc: 5   PainLoc: Hip   Body mass index is 24.37 kg/m.     05/21/2022    3:15 PM 08/04/2021   11:55 AM 03/12/2016    2:43 PM 03/10/2015    2:47 PM 09/02/2014   11:43 PM 05/27/2012    4:35 PM 08/24/2011   12:06 PM  Advanced Directives  Does Patient Have a Medical Advance Directive? Yes No Yes Yes Yes Patient does not have advance directive Patient does not have advance directive  Type of Advance Directive [place]  Healthcare Power of [place]    Copy of [place] Chart? No - copy requested          Current Medications (verified) Outpatient Encounter Medications as of 05/21/2022  Medication Sig    Biotin 1000 MCG tablet Take 1,000 mcg by mouth 2 (two) times daily.   celecoxib (CELEBREX) 200 MG capsule Take 1 capsule (200 mg total) by mouth daily.   Cholecalciferol (VITAMIN D3) 125 MCG (5000 UT) CAPS Take 5,000 Units by mouth daily.   Probiotic Product (PROBIOTIC DAILY PO) Take by mouth.   tirzepatide (MOUNJARO) 7.5 MG/0.5ML Pen INJECT THE CONTENTS OF ONE PEN  SUBCUTANEOUSLY WEEKLY AS  DIRECTED   venlafaxine XR (EFFEXOR-XR) 37.5 MG 24 hr capsule Take 1 capsule (37.5 mg total) by mouth daily with breakfast.   [DISCONTINUED] benzonatate (TESSALON) 200 MG capsule Take 1 capsule (200 mg total) by mouth 2 (two) times daily as needed for cough.   No facility-administered encounter medications on file as of 05/21/2022.    Allergies (verified) Other and Codeine   History: Past Medical History:  Diagnosis Date   Allergy    Anxiety    Arthritis    left knee   Breast cancer ([facility] 08/14/11 [facility]   Left breast lumpectomy/left axillary lymph node resection,invasive Grade II Lobular CA in Situ,,left lymph node bx==positive for metastatic lobular ca(2/2)   Cancer (HCC)    left breast   Carotid artery aneurysm (HCC)    left internal carotid artery; last MRI 02/22/2011   Cerebral aneurysm    dx by cerebral angiogram,encased in bone   History of chemotherapy final 02/05/12   4 cycles Adriamycin/Cytoxan and 12 Weeks of Taxol  Hot flashes    Hot flushes, perimenopausal    S/P radiation therapy 02/12/12 -03/26/12   Left Breast, Left Axilla and Left Supraclivcular Region   Stress fracture    left foot/ankle   Stress fracture of the metatarsals 11/09/2009   Qualifier: Diagnosis of  By: Oneida Alar MD, KARL     Use of tamoxifen (Nolvadex) Start - 04/10/12   Past Surgical History:  Procedure Laterality Date   ABDOMINAL HYSTERECTOMY  1980's   ovaries intact   AXILLARY LYMPH NODE DISSECTION  08/28/2011   Procedure: AXILLARY LYMPH NODE DISSECTION;  Surgeon: Rolm Bookbinder, MD;  Location: [facility];  Service: General;  Laterality: Left;  Left axillary node dissection, port placement   BREAST LUMPECTOMY  08/14/11   Left breast lumpectomy, left axillary sentinel node biopsy   left knee reconstruction     six surgeries (3 orthoscopic) 3 open, ACL   PARTIAL HYSTERECTOMY  1984? 1989?    for fibroids    PORT-A-CATH REMOVAL  05/29/2012   Procedure: REMOVAL PORT-A-CATH;  Surgeon: Rolm Bookbinder, MD;  Location: [facility];  Service: General;  Laterality: N/A;   PORTACATH PLACEMENT  08/28/2011   Procedure: INSERTION PORT-A-CATH;  Surgeon: Rolm Bookbinder, MD;  Location: [facility];  Service: General;  Laterality: Right;   TONSILLECTOMY AND ADENOIDECTOMY  1971   TRIGGER FINGER RELEASE  06/29/2008   release A-1 pulley and exc. cyst left thumb   Family History  Problem Relation Age of Onset   Anesthesia problems Mother        post-op N/V   Cancer Father 44       lung - ages 71 and 64; prostate   Cancer Maternal Aunt 80       ovarian   Cancer Paternal Aunt        lung   Cancer Paternal Uncle        bone cancer   Melanoma Cousin 9       female ,skin   Social History   Socioeconomic History   Marital status: Married    Spouse name: Not on file   Number of children: Not on file   Years of education: Not on file   Highest education level: Not on file  Occupational History   Occupation: Retired Wellsite geologist  Tobacco Use   Smoking status: Former    Packs/day: 1.00    Years: 15.00    Total pack years: 15.00    Types: Cigarettes    Quit date: 01/23/2001    Years since quitting: 21.3   Smokeless tobacco: Never   Tobacco comments:    quit smoking 10 yrs. ago  Substance and Sexual Activity   Alcohol use: Yes    Comment: occasionally   Drug use: No    Comment: quit smoking 2002, smoked for 10 quit restarted for 5 more   Sexual activity: Not on file    Comment: hrt 1 year    Other Topics Concern   Not on file  Social History  Narrative   Retired Engineer, structural x 30 yrs, single   Social Determinants of Health   Financial Resource Strain: [place]  (05/21/2022)   Overall Financial Resource Strain (CARDIA)    Difficulty of Paying Living Expenses: Not hard at all  Food Insecurity: No Food Insecurity (05/21/2022)   Hunger Vital Sign    Worried About Running Out of Food in the Last Year: Never true    Ran Out of Food in  the Last Year: Never true  Transportation Needs: No Transportation Needs (05/21/2022)   PRAPARE - Hydrologist (Medical): No    Lack of Transportation (Non-Medical): No  Physical Activity: Sufficiently Active (05/21/2022)   Exercise Vital Sign    Days of Exercise per Week: 7 days    Minutes of Exercise per Session: 30 min  Stress: No Stress Concern Present (05/21/2022)   [place]    Feeling of Stress : Not at all  Social Connections: Moderately Integrated (05/21/2022)   Social Connection and Isolation Panel [NHANES]    Frequency of Communication with Friends and Family: More than three times a week    Frequency of Social Gatherings with Friends and Family: More than three times a week    Attends Religious Services: Never    Marine scientist or Organizations: Yes    Attends Archivist Meetings: Never    Marital Status: Married    Tobacco Counseling Counseling given: Not Answered Tobacco comments: quit smoking 10 yrs. ago   Clinical Intake:  Pre-visit preparation completed: Yes  Pain : No/denies pain Pain Score: 5      BMI - recorded: 24.37 Nutritional Status: BMI of 19-24  Normal Nutritional Risks: None Diabetes: No  How often do you need to have someone help you when you read instructions, pamphlets, or other written materials from your doctor or pharmacy?: 1 - Never What is the last grade level you completed in school?: HSG; Bachelor's of Science Degree  Diabetic?  No  Interpreter Needed?: No  Information entered by :: Lisette Abu, LPN.   Activities of Daily Living    05/21/2022    3:36 PM  In your present state of health, do you have any difficulty performing the following activities:  Hearing? 0  Vision? 0  Difficulty concentrating or making decisions? 0  Walking or climbing stairs? 0  Dressing or bathing? 0  Doing errands, shopping? 0  Preparing Food and eating ? N  Using the Toilet? N  In the past six months, have you accidently leaked urine? N  Do you have problems with loss of bowel control? N  Managing your Medications? N  Managing your Finances? N  Housekeeping or managing your Housekeeping? N    Patient Care Team: Binnie Rail, MD as PCP - General (Internal Medicine)  Indicate any recent Medical Services you may have received from other than [facility] providers in the past year (date may be approximate).     Assessment:   This is a routine wellness examination for Gimena.  Hearing/Vision screen Hearing Screening - Comments:: Denies hearing difficulties   Vision Screening - Comments:: Wears rx glasses - up to date with routine eye exams   Dietary issues and exercise activities discussed: Current Exercise Habits: The patient has a physically strenuous job, but has no regular exercise apart from work., Type of exercise: walking;Other - see comments (bicycling), Time (Minutes): 30, Frequency (Times/Week): 7, Weekly Exercise (Minutes/Week): 210, Intensity: Moderate, Exercise limited by: orthopedic condition(s)   Goals Addressed             This Visit's Progress    My goal is to ride my bike more and travel miles on my bike with my friends.        Depression Screen    05/21/2022    3:27 PM 02/23/2022    9:14 AM 09/15/2021   10:55 AM 09/14/2020  9:46 AM  PHQ 2/9 Scores  PHQ - 2 Score 0 0 1 0    Fall Risk    05/21/2022    3:16 PM 09/15/2021   10:55 AM  [place] in the past year? 0 0  Number falls in  past yr: 0 0  Injury with Fall? 0 0  Risk for fall due to : No Fall Risks No Fall Risks  Follow up Falls prevention discussed Falls evaluation completed    FALL RISK PREVENTION PERTAINING TO THE HOME:  Any stairs in or around the home? No  If so, are there any without handrails? No  Home free of loose throw rugs in walkways, pet beds, electrical cords, etc? Yes  Adequate lighting in your home to reduce risk of falls? Yes   ASSISTIVE DEVICES UTILIZED TO PREVENT FALLS:  Life alert? No  Use of a cane, walker or w/c? No  Grab bars in the bathroom? Yes  Shower chair or bench in shower? No  Elevated toilet seat or a handicapped toilet? Yes   TIMED UP AND GO:  Was the test performed? No . Phone Visit  Cognitive Function:        05/21/2022    3:38 PM  6CIT Screen  What Year? 0 points  What month? 0 points  What time? 0 points  Count back from 20 0 points  Months in reverse 0 points  Repeat phrase 0 points  Total Score 0 points    Immunizations Immunization History  Administered Date(s) Administered   PFIZER(Purple Top)SARS-COV-2 Vaccination 07/10/2019, 07/31/2019, 03/05/2020, 03/10/2021    TDAP status: Up to date  Flu Vaccine status: Declined, Education has been provided regarding the importance of this vaccine but patient still declined. Advised may receive this vaccine at local pharmacy or Health Dept. Aware to provide a copy of the vaccination record if obtained from local pharmacy or Health Dept. Verbalized acceptance and understanding.  Pneumococcal vaccine status: Due, Education has been provided regarding the importance of this vaccine. Advised may receive this vaccine at local pharmacy or Health Dept. Aware to provide a copy of the vaccination record if obtained from local pharmacy or Health Dept. Verbalized acceptance and understanding.  Covid-19 vaccine status: Completed vaccines  Qualifies for Shingles Vaccine? Yes   Zostavax completed No   Shingrix  Completed?: No.    Education has been provided regarding the importance of this vaccine. Patient has been advised to call insurance company to determine out of pocket expense if they have not yet received this vaccine. Advised may also receive vaccine at local pharmacy or Health Dept. Verbalized acceptance and understanding.  Screening Tests Health Maintenance  Topic Date Due   DTaP/Tdap/Td (1 - Tdap) Never done   Zoster Vaccines- Shingrix (1 of 2) Never done   COLONOSCOPY (Pts 45-42yr Insurance coverage will need to be confirmed)  Never done   DEXA SCAN  02/09/2017   INFLUENZA VACCINE  Never done   COVID-19 Vaccine (5 - 2023-24 season) 02/16/2022   Pneumonia Vaccine 66 Years old (1 - PCV) 09/13/2022 (Originally 12/30/2020)   Medicare Annual Wellness (AWV)  05/22/2023   MAMMOGRAM  11/17/2023   HPV VACCINES  Aged Out   Hepatitis C Screening  Discontinued    Health Maintenance  Health Maintenance Due  Topic Date Due   DTaP/Tdap/Td (1 - Tdap) Never done   Zoster Vaccines- Shingrix (1 of 2) Never done   COLONOSCOPY (Pts 45-457yrInsurance coverage will need to be confirmed)  Never done   DEXA SCAN  02/09/2017   INFLUENZA VACCINE  Never done   COVID-19 Vaccine (5 - 2023-24 season) 02/16/2022    Colorectal cancer screening: Type of screening: Colonoscopy. Completed 03/19/2014. Repeat every 10 years  Mammogram status: Completed 11/28/2021. Repeat every year  Bone Density status: Completed 11/28/2021. Results reflect: Bone density results: OSTEOPENIA. Repeat every 2-3 years.  Lung Cancer Screening: (Low Dose CT Chest recommended if Age 7-80 years, 30 pack-year currently smoking OR have quit w/in 15years.) does not qualify.   Lung Cancer Screening Referral: no  Additional Screening:  Hepatitis C Screening: does qualify; Completed no  Vision Screening: Recommended annual ophthalmology exams for early detection of glaucoma and other disorders of the eye. Is the patient up to date  with their annual eye exam?  Yes  Who is the provider or what is the name of the office in which the patient attends annual eye exams? Patient will send in name of eye doctor. If pt is not established with a provider, would they like to be referred to a provider to establish care? No .   Dental Screening: Recommended annual dental exams for proper oral hygiene  Community Resource Referral / Chronic Care Management: CRR required this visit?  No   CCM required this visit?  No      Plan:     I have personally reviewed and noted the following in the patient's chart:   Medical and social history Use of alcohol, tobacco or illicit drugs  Current medications and supplements including opioid prescriptions. Patient is not currently taking opioid prescriptions. Functional ability and status Nutritional status Physical activity Advanced directives List of other physicians Hospitalizations, surgeries, and ER visits in previous 12 months Vitals Screenings to include cognitive, depression, and falls Referrals and appointments  In addition, I have reviewed and discussed with patient certain preventive protocols, quality metrics, and best practice recommendations. A written personalized care plan for preventive services as well as general preventive health recommendations were provided to patient.     Sheral Flow, LPN   [id number]   Nurse Notes: N/A

## 2022-05-21 NOTE — Patient Instructions (Signed)
Emily Knight , Thank you for taking time to come for your Medicare Wellness Visit. I appreciate your ongoing commitment to your health goals. Please review the following plan we discussed and let me know if I can assist you in the future.   These are the goals we discussed:  Goals      My goal is to ride my bike more and travel miles on my bike with my friends.        This is a list of the screening recommended for you and due dates:  Health Maintenance  Topic Date Due   DTaP/Tdap/Td vaccine (1 - Tdap) Never done   Zoster (Shingles) Vaccine (1 of 2) Never done   Colon Cancer Screening  Never done   DEXA scan (bone density measurement)  02/09/2017   Flu Shot  Never done   COVID-19 Vaccine (5 - 2023-24 season) 02/16/2022   Pneumonia Vaccine (1 - PCV) 09/13/2022*   Medicare Annual Wellness Visit  05/22/2023   Mammogram  11/17/2023   HPV Vaccine  Aged Out   Hepatitis C Screening: USPSTF Recommendation to screen - Ages 18-79 yo.  Discontinued  *Topic was postponed. The date shown is not the original due date.    Advanced directives: Yes  Conditions/risks identified: Yes  Next appointment: Follow up in one year for your annual wellness visit by calling [phone] to schedule with Nurse Mignon Pine.   Preventive Care 23 Years and Older, Female Preventive care refers to lifestyle choices and visits with your health care provider that can promote health and wellness. What does preventive care include? A yearly physical exam. This is also called an annual well check. Dental exams once or twice a year. Routine eye exams. Ask your health care provider how often you should have your eyes checked. Personal lifestyle choices, including: Daily care of your teeth and gums. Regular physical activity. Eating a healthy diet. Avoiding tobacco and drug use. Limiting alcohol use. Practicing safe sex. Taking low-dose aspirin every day. Taking vitamin and mineral supplements as recommended by your  health care provider. What happens during an annual well check? The services and screenings done by your health care provider during your annual well check will depend on your age, overall health, lifestyle risk factors, and family history of disease. Counseling  Your health care provider may ask you questions about your: Alcohol use. Tobacco use. Drug use. Emotional well-being. Home and relationship well-being. Sexual activity. Eating habits. History of falls. Memory and ability to understand (cognition). Work and work Statistician. Reproductive health. Screening  You may have the following tests or measurements: Height, weight, and BMI. Blood pressure. Lipid and cholesterol levels. These may be checked every 5 years, or more frequently if you are over 66 years old. Skin check. Lung cancer screening. You may have this screening every year starting at age 37 if you have a 30-pack-year history of smoking and currently smoke or have quit within the past 15 years. Fecal occult blood test (FOBT) of the stool. You may have this test every year starting at age 50. Flexible sigmoidoscopy or colonoscopy. You may have a sigmoidoscopy every 5 years or a colonoscopy every 10 years starting at age 56. Hepatitis C blood test. Hepatitis B blood test. Sexually transmitted disease (STD) testing. Diabetes screening. This is done by checking your blood sugar (glucose) after you have not eaten for a while (fasting). You may have this done every 1-3 years. Bone density scan. This is done to screen for osteoporosis.  You may have this done starting at age 70. Mammogram. This may be done every 1-2 years. Talk to your health care provider about how often you should have regular mammograms. Talk with your health care provider about your test results, treatment options, and if necessary, the need for more tests. Vaccines  Your health care provider may recommend certain vaccines, such as: Influenza vaccine.  This is recommended every year. Tetanus, diphtheria, and acellular pertussis (Tdap, Td) vaccine. You may need a Td booster every 10 years. Zoster vaccine. You may need this after age 57. Pneumococcal 13-valent conjugate (PCV13) vaccine. One dose is recommended after age 74. Pneumococcal polysaccharide (PPSV23) vaccine. One dose is recommended after age 45. Talk to your health care provider about which screenings and vaccines you need and how often you need them. This information is not intended to replace advice given to you by your health care provider. Make sure you discuss any questions you have with your health care provider. Document Released: 07/01/2015 Document Revised: 02/22/2016 Document Reviewed: 04/05/2015 Elsevier Interactive Patient Education  2017 [place] Prevention in the Home Falls can cause injuries. They can happen to people of all ages. There are many things you can do to make your home safe and to help prevent falls. What can I do on the outside of my home? Regularly fix the edges of walkways and driveways and fix any cracks. Remove anything that might make you trip as you walk through a door, such as a raised step or threshold. Trim any bushes or trees on the path to your home. Use bright outdoor lighting. Clear any walking paths of anything that might make someone trip, such as rocks or tools. Regularly check to see if handrails are loose or broken. Make sure that both sides of any steps have handrails. Any raised decks and porches should have guardrails on the edges. Have any leaves, snow, or ice cleared regularly. Use sand or salt on walking paths during winter. Clean up any spills in your garage right away. This includes oil or grease spills. What can I do in the bathroom? Use night lights. Install grab bars by the toilet and in the tub and shower. Do not use towel bars as grab bars. Use non-skid mats or decals in the tub or shower. If you need to sit  down in the shower, use a plastic, non-slip stool. Keep the floor dry. Clean up any water that spills on the floor as soon as it happens. Remove soap buildup in the tub or shower regularly. Attach bath mats securely with double-sided non-slip rug tape. Do not have throw rugs and other things on the floor that can make you trip. What can I do in the bedroom? Use night lights. Make sure that you have a light by your bed that is easy to reach. Do not use any sheets or blankets that are too big for your bed. They should not hang down onto the floor. Have a firm chair that has side arms. You can use this for support while you get dressed. Do not have throw rugs and other things on the floor that can make you trip. What can I do in the kitchen? Clean up any spills right away. Avoid walking on wet floors. Keep items that you use a lot in easy-to-reach places. If you need to reach something above you, use a strong step stool that has a grab bar. Keep electrical cords out of the way. Do not use  floor polish or wax that makes floors slippery. If you must use wax, use non-skid floor wax. Do not have throw rugs and other things on the floor that can make you trip. What can I do with my stairs? Do not leave any items on the stairs. Make sure that there are handrails on both sides of the stairs and use them. Fix handrails that are broken or loose. Make sure that handrails are as long as the stairways. Check any carpeting to make sure that it is firmly attached to the stairs. Fix any carpet that is loose or worn. Avoid having throw rugs at the top or bottom of the stairs. If you do have throw rugs, attach them to the floor with carpet tape. Make sure that you have a light switch at the top of the stairs and the bottom of the stairs. If you do not have them, ask someone to add them for you. What else can I do to help prevent falls? Wear shoes that: Do not have high heels. Have rubber bottoms. Are  comfortable and fit you well. Are closed at the toe. Do not wear sandals. If you use a stepladder: Make sure that it is fully opened. Do not climb a closed stepladder. Make sure that both sides of the stepladder are locked into place. Ask someone to hold it for you, if possible. Clearly mark and make sure that you can see: Any grab bars or handrails. First and last steps. Where the edge of each step is. Use tools that help you move around (mobility aids) if they are needed. These include: Canes. Walkers. Scooters. Crutches. Turn on the lights when you go into a dark area. Replace any light bulbs as soon as they burn out. Set up your furniture so you have a clear path. Avoid moving your furniture around. If any of your floors are uneven, fix them. If there are any pets around you, be aware of where they are. Review your medicines with your doctor. Some medicines can make you feel dizzy. This can increase your chance of falling. Ask your doctor what other things that you can do to help prevent falls. This information is not intended to replace advice given to you by your health care provider. Make sure you discuss any questions you have with your health care provider. Document Released: 03/31/2009 Document Revised: 11/10/2015 Document Reviewed: 07/09/2014 Elsevier Interactive Patient Education  2017 [employer].

## 2022-05-23 ENCOUNTER — Encounter: Payer: Self-pay | Admitting: Internal Medicine

## 2022-06-06 ENCOUNTER — Encounter: Payer: Self-pay | Admitting: Internal Medicine

## 2022-06-29 ENCOUNTER — Encounter: Payer: Self-pay | Admitting: Internal Medicine

## 2022-07-02 ENCOUNTER — Other Ambulatory Visit (HOSPITAL_COMMUNITY): Payer: Self-pay

## 2022-07-02 NOTE — Telephone Encounter (Signed)
Patient Advocate Encounter   Received notification from [facility] that prior authorization for Mounjaro 7.'5MG'$ /0.5ML is required.   PA submitted on 07/02/2022 Key [id number] Status is pending

## 2022-07-03 NOTE — Telephone Encounter (Signed)
Pharmacy Patient Advocate Encounter  Received notification from [facility] that the request for prior authorization for Mounjaro Inj 7.'5mg'$ /0.66m has been denied due to .    You may call [phone] appeal.  Please be advised we currently do not have a Pharmacist to review denials. If you would like [employer] submit it on your behalf, please provide clinical information to support your reason for appeal and any pertinent information you would like [employer] include with the appeal request. Appeals may take longer 5 business days to be submitted as we prepares necessary documentation. Thanks for your support.  How would you like to proceed?

## 2022-07-10 ENCOUNTER — Encounter: Payer: Self-pay | Admitting: Internal Medicine

## 2022-07-19 ENCOUNTER — Other Ambulatory Visit (HOSPITAL_COMMUNITY): Payer: Self-pay

## 2022-07-19 ENCOUNTER — Other Ambulatory Visit: Payer: Self-pay | Admitting: Internal Medicine

## 2022-07-19 NOTE — Telephone Encounter (Signed)
An appeal was not requested by patient in previous encounter and I haven't seen any appeal forms come through yet.   Please be advised we currently do not have a Pharmacist to review denials, therefore you will need to process appeals accordingly as needed. Thanks for your support at this time.   You may call [phone] or fax [phone], to appeal.

## 2022-07-23 ENCOUNTER — Encounter: Payer: Self-pay | Admitting: Internal Medicine

## 2022-07-24 ENCOUNTER — Other Ambulatory Visit: Payer: Self-pay

## 2022-07-24 MED ORDER — MOUNJARO 7.5 MG/0.5ML ~~LOC~~ SOAJ: SUBCUTANEOUS | 0 refills | Status: DC

## 2022-08-07 ENCOUNTER — Encounter: Payer: Self-pay | Admitting: Internal Medicine

## 2022-08-08 ENCOUNTER — Encounter: Payer: Self-pay | Admitting: Internal Medicine

## 2022-08-08 NOTE — Progress Notes (Unsigned)
    Subjective:    Patient ID: Emily Knight, female    DOB: [birth year], 67 y.o.   MRN: [id number]      HPI Emily Knight is here for No chief complaint on file.  She is here for an acute visit for cold symptoms.   Her symptoms started   She is experiencing   She has tried taking           Medications and allergies reviewed with patient and updated if appropriate.  Current Outpatient Medications on File Prior to Visit  Medication Sig Dispense Refill   Biotin 1000 MCG tablet Take 1,000 mcg by mouth 2 (two) times daily.     celecoxib (CELEBREX) 200 MG capsule TAKE 1 CAPSULE BY MOUTH DAILY 90 capsule 3   Cholecalciferol (VITAMIN D3) 125 MCG (5000 UT) CAPS Take 5,000 Units by mouth daily.     Probiotic Product (PROBIOTIC DAILY PO) Take by mouth.     tirzepatide (MOUNJARO) 7.5 MG/0.5ML Pen INJECT THE CONTENTS OF ONE PEN  SUBCUTANEOUSLY WEEKLY AS  DIRECTED 6 mL 0   venlafaxine XR (EFFEXOR-XR) 37.5 MG 24 hr capsule TAKE 1 CAPSULE BY MOUTH DAILY  WITH BREAKFAST 90 capsule 3   No current facility-administered medications on file prior to visit.    Review of Systems     Objective:  There were no vitals filed for this visit. BP Readings from Last 3 Encounters:  02/23/22 120/70  09/12/21 128/78  08/04/21 (!) 158/95   Wt Readings from Last 3 Encounters:  05/21/22 155 lb 9.6 oz (70.6 kg)  02/23/22 160 lb (72.6 kg)  09/12/21 182 lb (82.6 kg)   There is no height or weight on file to calculate BMI.    Physical Exam         Assessment & Plan:    See Problem List for Assessment and Plan of chronic medical problems.

## 2022-08-08 NOTE — Patient Instructions (Addendum)
      Blood work was ordered.   The lab is on the first floor.    Medications changes include :       A referral was ordered for XXX.     Someone will call you to schedule an appointment.    Return in about 6 weeks (around 09/20/2022) for Physical Exam.

## 2022-08-09 ENCOUNTER — Encounter: Payer: Self-pay | Admitting: Internal Medicine

## 2022-08-09 ENCOUNTER — Ambulatory Visit: Payer: Medicare Other | Admitting: Internal Medicine

## 2022-08-09 VITALS — BP 122/80 | HR 65 | Temp 98.0°F | Ht 67.0 in | Wt 157.0 lb

## 2022-08-09 DIAGNOSIS — J069 Acute upper respiratory infection, unspecified: Secondary | ICD-10-CM

## 2022-08-09 NOTE — Assessment & Plan Note (Signed)
Acute Symptoms likely viral in nature Covid test neg at home Continue symptomatic treatment with over-the-counter cold medications, Tylenol/ibuprofen Increase rest and fluids Call if symptoms worsen or do not improve

## 2022-08-29 ENCOUNTER — Ambulatory Visit: Payer: Medicare Other | Admitting: Internal Medicine

## 2022-08-29 ENCOUNTER — Encounter: Payer: Self-pay | Admitting: Internal Medicine

## 2022-08-29 ENCOUNTER — Ambulatory Visit (INDEPENDENT_AMBULATORY_CARE_PROVIDER_SITE_OTHER): Payer: Medicare Other

## 2022-08-29 VITALS — BP 122/78 | HR 72 | Temp 98.1°F | Ht 67.0 in | Wt 157.0 lb

## 2022-08-29 DIAGNOSIS — R052 Subacute cough: Secondary | ICD-10-CM

## 2022-08-29 DIAGNOSIS — R5383 Other fatigue: Secondary | ICD-10-CM | POA: Diagnosis not present

## 2022-08-29 LAB — CBC WITH DIFFERENTIAL/PLATELET
Basophils Absolute: 0 10*3/uL (ref 0.0–0.1)
Basophils Relative: 0.4 % (ref 0.0–3.0)
Eosinophils Absolute: 0.1 10*3/uL (ref 0.0–0.7)
Eosinophils Relative: 0.9 % (ref 0.0–5.0)
HCT: 45.3 % (ref 36.0–46.0)
Hemoglobin: 15.5 g/dL — ABNORMAL HIGH (ref 12.0–15.0)
Lymphocytes Relative: 21.5 % (ref 12.0–46.0)
Lymphs Abs: 2.1 10*3/uL (ref 0.7–4.0)
MCHC: 34.2 g/dL (ref 30.0–36.0)
MCV: 92.2 fl (ref 78.0–100.0)
Monocytes Absolute: 0.5 10*3/uL (ref 0.1–1.0)
Monocytes Relative: 5.2 % (ref 3.0–12.0)
Neutro Abs: 7.1 10*3/uL (ref 1.4–7.7)
Neutrophils Relative %: 72 % (ref 43.0–77.0)
Platelets: 327 10*3/uL (ref 150.0–400.0)
RBC: 4.91 Mil/uL (ref 3.87–5.11)
RDW: 13 % (ref 11.5–15.5)
WBC: 9.8 10*3/uL (ref 4.0–10.5)

## 2022-08-29 LAB — TSH: TSH: 1.34 u[IU]/mL (ref 0.35–5.50)

## 2022-08-29 LAB — COMPREHENSIVE METABOLIC PANEL
ALT: 19 U/L (ref 0–35)
AST: 18 U/L (ref 0–37)
Albumin: 4.6 g/dL (ref 3.5–5.2)
Alkaline Phosphatase: 74 U/L (ref 39–117)
BUN: 17 mg/dL (ref 6–23)
CO2: 32 mEq/L (ref 19–32)
Calcium: 10.3 mg/dL (ref 8.4–10.5)
Chloride: 101 mEq/L (ref 96–112)
Creatinine, Ser: 0.83 mg/dL (ref 0.40–1.20)
GFR: 73.34 mL/min (ref >60.00)
Glucose, Bld: 76 mg/dL (ref 70–99)
Potassium: 4.5 mEq/L (ref 3.5–5.1)
Sodium: 138 mEq/L (ref 135–145)
Total Bilirubin: 0.5 mg/dL (ref 0.2–1.2)
Total Protein: 7.5 g/dL (ref 6.0–8.3)

## 2022-08-29 MED ORDER — AMOXICILLIN-POT CLAVULANATE 875-125 MG PO TABS
1.0000 | ORAL_TABLET | Freq: Two times a day (BID) | ORAL | 0 refills | Status: AC
Start: 2022-08-29 — End: 2022-09-08

## 2022-08-29 NOTE — Assessment & Plan Note (Signed)
Subacute Fatigue for the past 3 months or so Not improving ?  Related to persistent cough/cold symptoms versus other Check CBC, CMP, TSH Chest x-ray given cough If no improvement will require further evaluation

## 2022-08-29 NOTE — Patient Instructions (Addendum)
      Blood work was ordered.   A chest xray was ordered.     Medications changes include :   Augmentin twice daily for 10 days.      Return if symptoms worsen or fail to improve.

## 2022-08-29 NOTE — Progress Notes (Signed)
Subjective:    Patient ID: Emily Knight, female    DOB: 04-22-1956, 67 y.o.   MRN: [id number]      HPI Octava is here for  Chief Complaint  Patient presents with   chest congestion    Started in middle of December has been having fatigue and cough and congestion all has been lingering since then has low grade fever off and on , has tried otc medication and nothing has worked would like to get a chest xray    She is here for an acute visit for cold symptoms.   Her symptoms started december-symptoms have been intermittent, waxing and waning since then, but has never gone away.  Her main symptom is cough, but she also has significant fatigue.  She is experiencing Intermittent right fever, fatigue, sore throat, hoarseness, cough that is productive at times, wheezing, chest pain from coughing, headaches and dizziness.  She denies shortness of breath, nasal congestion, sinus pain and lightheadedness.  She feels like she is getting flushes intermittently.   Her sleep is the same as always-not great, but nothing is new for her.  She is eating and drinking fairly normally.  Medications and allergies reviewed with patient and updated if appropriate.  Current Outpatient Medications on File Prior to Visit  Medication Sig Dispense Refill   Biotin 1000 MCG tablet Take 1,000 mcg by mouth 2 (two) times daily.     celecoxib (CELEBREX) 200 MG capsule TAKE 1 CAPSULE BY MOUTH DAILY 90 capsule 3   Cholecalciferol (VITAMIN D3) 125 MCG (5000 UT) CAPS Take 5,000 Units by mouth daily.     Probiotic Product (PROBIOTIC DAILY PO) Take by mouth.     venlafaxine XR (EFFEXOR-XR) 37.5 MG 24 hr capsule TAKE 1 CAPSULE BY MOUTH DAILY  WITH BREAKFAST 90 capsule 3   No current facility-administered medications on file prior to visit.    Review of Systems  Constitutional:  Positive for fatigue and fever (low grade).  HENT:  Positive for sore throat and voice change. Negative for congestion, ear pain, sinus  pressure and sinus pain.   Respiratory:  Positive for cough and wheezing (clears wtih coughing). Negative for shortness of breath.   Cardiovascular:  Positive for chest pain (from coughing).  Neurological:  Positive for dizziness and headaches. Negative for light-headedness.       Objective:   Vitals:   08/29/22 1451  BP: 122/78  Pulse: 72  Temp: 98.1 F (36.7 C)  SpO2: 98%   BP Readings from Last 3 Encounters:  08/29/22 122/78  08/09/22 122/80  02/23/22 120/70   Wt Readings from Last 3 Encounters:  08/29/22 157 lb (71.2 kg)  08/09/22 157 lb (71.2 kg)  05/21/22 155 lb 9.6 oz (70.6 kg)   Body mass index is 24.59 kg/m.    Physical Exam Constitutional:      General: She is not in acute distress.    Appearance: Normal appearance. She is not ill-appearing.  HENT:     Head: Normocephalic and atraumatic.     Right Ear: Tympanic membrane, ear canal and external ear normal.     Left Ear: Tympanic membrane, ear canal and external ear normal.     Mouth/Throat:     Mouth: Mucous membranes are moist.     Pharynx: No oropharyngeal exudate or posterior oropharyngeal erythema.  Eyes:     Conjunctiva/sclera: Conjunctivae normal.  Cardiovascular:     Rate and Rhythm: Normal rate and regular rhythm.  Pulmonary:  Effort: Pulmonary effort is normal. No respiratory distress.     Breath sounds: Normal breath sounds. No wheezing or rales.  Musculoskeletal:     Cervical back: Neck supple. No tenderness.  Lymphadenopathy:     Cervical: No cervical adenopathy.  Skin:    General: Skin is warm and dry.  Neurological:     Mental Status: She is alert.        DG Chest 2 View CLINICAL DATA:  Cough for 3 months  EXAM: CHEST - 2 VIEW  COMPARISON:  08/28/2011  FINDINGS: The heart size and mediastinal contours are within normal limits. Both lungs are clear. Disc degenerative disease of the thoracic spine.  IMPRESSION: No acute abnormality of the lungs.  Electronically  Signed   By: Delanna Ahmadi M.D.   On: 08/29/2022 16:00      Assessment & Plan:    See Problem List for Assessment and Plan of chronic medical problems.

## 2022-08-29 NOTE — Assessment & Plan Note (Signed)
Subacute cough-started in December with cold symptoms and have waxed and waned since then, but not improved or gone away Cough is productive at times Also with a low-grade fevers fatigue, sore throat, hoarseness, wheezing, headaches and dizziness Chest x-ray today Augmentin twice daily x 10 days Will check CBC, CMP, TSH Hopefully symptoms will improve with antibiotic-she will let me know if they do not so that we can evaluate further

## 2022-11-22 LAB — HM MAMMOGRAPHY

## 2022-11-23 ENCOUNTER — Encounter: Payer: Self-pay | Admitting: Internal Medicine

## 2022-11-23 NOTE — Progress Notes (Signed)
Outside notes received. Information abstracted. Notes sent to scan.  

## 2023-05-30 ENCOUNTER — Other Ambulatory Visit: Payer: Self-pay | Admitting: Internal Medicine

## 2023-07-15 ENCOUNTER — Encounter: Payer: Self-pay | Admitting: Internal Medicine

## 2023-07-15 DIAGNOSIS — E785 Hyperlipidemia, unspecified: Secondary | ICD-10-CM | POA: Insufficient documentation

## 2023-07-15 NOTE — Progress Notes (Unsigned)
Subjective:    Patient ID: Emily Knight, female    DOB: 26-Jul-1955, 68 y.o.   MRN: [id number]      HPI Emily Knight is here for a Physical exam and her chronic medical problems.    Difficulty falling and staying asleep.  Taking ZzzQuil as needed now which does help.  She is very active throughout the day and often does not take time to relax before going to bed.  She is currently getting Mounjaro 7.5 mg from that spot.  Medication is working well.  She tolerates it well and has been able to maintain her weight.  Medications and allergies reviewed with patient and updated if appropriate.  Current Outpatient Medications on File Prior to Visit  Medication Sig Dispense Refill   Biotin 1000 MCG tablet Take 1,000 mcg by mouth 2 (two) times daily.     celecoxib (CELEBREX) 200 MG capsule TAKE 1 CAPSULE BY MOUTH DAILY 90 capsule 3   Cholecalciferol (VITAMIN D3) 125 MCG (5000 UT) CAPS Take 5,000 Units by mouth daily.     Probiotic Product (PROBIOTIC DAILY PO) Take by mouth.     venlafaxine XR (EFFEXOR-XR) 37.5 MG 24 hr capsule TAKE 1 CAPSULE BY MOUTH DAILY  WITH BREAKFAST 90 capsule 3   No current facility-administered medications on file prior to visit.    Review of Systems  Constitutional:  Negative for fever.  Eyes:  Negative for visual disturbance.  Respiratory:  Negative for cough, shortness of breath and wheezing.   Cardiovascular:  Negative for chest pain, palpitations and leg swelling.  Gastrointestinal:  Negative for abdominal pain, blood in stool, constipation and diarrhea.       No gerd  Genitourinary:  Negative for dysuria.  Musculoskeletal:  Positive for arthralgias (knees, hips) and back pain (chronic).       Leg cramping  Skin:  Negative for rash.  Neurological:  Negative for light-headedness and headaches.  Psychiatric/Behavioral:  Positive for sleep disturbance. Negative for dysphoric mood. The patient is not nervous/anxious.        Objective:   Vitals:    07/16/23 1310  BP: 118/74  Pulse: 78  Temp: 98 F (36.7 C)  SpO2: 98%   Filed Weights   07/16/23 1310  Weight: 158 lb (71.7 kg)   Body mass index is 24.75 kg/m.  BP Readings from Last 3 Encounters:  07/16/23 118/74  08/29/22 122/78  08/09/22 122/80    Wt Readings from Last 3 Encounters:  07/16/23 158 lb (71.7 kg)  08/29/22 157 lb (71.2 kg)  08/09/22 157 lb (71.2 kg)       Physical Exam Constitutional: She appears well-developed and well-nourished. No distress.  HENT:  Head: Normocephalic and atraumatic.  Right Ear: External ear normal. Normal ear canal and TM Left Ear: External ear normal.  Normal ear canal and TM Mouth/Throat: Oropharynx is clear and moist.  Eyes: Conjunctivae normal.  Neck: Neck supple. No tracheal deviation present. No thyromegaly present.  No carotid bruit  Cardiovascular: Normal rate, regular rhythm and normal heart sounds.   No murmur heard.  No edema. Pulmonary/Chest: Effort normal and breath sounds normal. No respiratory distress. She has no wheezes. She has no rales.  Breast: deferred   Abdominal: Soft. She exhibits no distension. There is no tenderness.  Lymphadenopathy: She has no cervical adenopathy.  Skin: Skin is warm and dry. She is not diaphoretic.  Psychiatric: She has a normal mood and affect. Her behavior is normal.     Lab  Results  Component Value Date   WBC 9.8 08/29/2022   HGB 15.5 (H) 08/29/2022   HCT 45.3 08/29/2022   PLT 327.0 08/29/2022   GLUCOSE 76 08/29/2022   CHOL 254 (H) 09/12/2021   TRIG 165.0 (H) 09/12/2021   HDL 42.90 09/12/2021   LDLCALC 178 (H) 09/12/2021   ALT 19 08/29/2022   AST 18 08/29/2022   NA 138 08/29/2022   K 4.5 08/29/2022   CL 101 08/29/2022   CREATININE 0.83 08/29/2022   BUN 17 08/29/2022   CO2 32 08/29/2022   TSH 1.34 08/29/2022   INR 0.93 09/02/2014         Assessment & Plan:   Physical exam: Screening blood work  ordered Exercise  very active Weight  is good Substance  abuse  none   Reviewed recommended immunizations.  Prevnar 20 today   Health Maintenance  Topic Date Due   DTaP/Tdap/Td (1 - Tdap) Never done   Zoster Vaccines- Shingrix (1 of 2) Never done   Colonoscopy  Never done   Pneumonia Vaccine 68+ Years old (1 of 1 - PCV) Never done   INFLUENZA VACCINE  01/17/2023   COVID-19 Vaccine (5 - 2024-25 season) 02/17/2023   Medicare Annual Wellness (AWV)  05/22/2023   MAMMOGRAM  11/21/2024   DEXA SCAN  11/28/2024   HPV VACCINES  Aged Out   Hepatitis C Screening  Discontinued          See Problem List for Assessment and Plan of chronic medical problems.

## 2023-07-15 NOTE — Patient Instructions (Addendum)

## 2023-07-16 ENCOUNTER — Encounter: Payer: Self-pay | Admitting: Internal Medicine

## 2023-07-16 ENCOUNTER — Ambulatory Visit (INDEPENDENT_AMBULATORY_CARE_PROVIDER_SITE_OTHER): Payer: Medicare Other | Admitting: Internal Medicine

## 2023-07-16 VITALS — BP 118/74 | HR 78 | Temp 98.0°F | Ht 67.0 in | Wt 158.0 lb

## 2023-07-16 DIAGNOSIS — Z23 Encounter for immunization: Secondary | ICD-10-CM

## 2023-07-16 DIAGNOSIS — Z0000 Encounter for general adult medical examination without abnormal findings: Secondary | ICD-10-CM | POA: Diagnosis not present

## 2023-07-16 DIAGNOSIS — M25561 Pain in right knee: Secondary | ICD-10-CM | POA: Diagnosis not present

## 2023-07-16 DIAGNOSIS — Z853 Personal history of malignant neoplasm of breast: Secondary | ICD-10-CM

## 2023-07-16 DIAGNOSIS — E7849 Other hyperlipidemia: Secondary | ICD-10-CM

## 2023-07-16 DIAGNOSIS — M8588 Other specified disorders of bone density and structure, other site: Secondary | ICD-10-CM | POA: Diagnosis not present

## 2023-07-16 DIAGNOSIS — R232 Flushing: Secondary | ICD-10-CM

## 2023-07-16 DIAGNOSIS — Z136 Encounter for screening for cardiovascular disorders: Secondary | ICD-10-CM

## 2023-07-16 DIAGNOSIS — G8929 Other chronic pain: Secondary | ICD-10-CM

## 2023-07-16 DIAGNOSIS — M25562 Pain in left knee: Secondary | ICD-10-CM

## 2023-07-16 LAB — COMPREHENSIVE METABOLIC PANEL
ALT: 24 U/L (ref 0–35)
AST: 24 U/L (ref 0–37)
Albumin: 5.1 g/dL (ref 3.5–5.2)
Alkaline Phosphatase: 64 U/L (ref 39–117)
BUN: 22 mg/dL (ref 6–23)
CO2: 27 meq/L (ref 19–32)
Calcium: 9.9 mg/dL (ref 8.4–10.5)
Chloride: 102 meq/L (ref 96–112)
Creatinine, Ser: 0.7 mg/dL (ref 0.40–1.20)
GFR: 89.42 mL/min (ref >60.00)
Glucose, Bld: 92 mg/dL (ref 70–99)
Potassium: 3.8 meq/L (ref 3.5–5.1)
Sodium: 136 meq/L (ref 135–145)
Total Bilirubin: 0.6 mg/dL (ref 0.2–1.2)
Total Protein: 7.6 g/dL (ref 6.0–8.3)

## 2023-07-16 LAB — CBC WITH DIFFERENTIAL/PLATELET
Basophils Absolute: 0.1 10*3/uL (ref 0.0–0.1)
Basophils Relative: 0.7 % (ref 0.0–3.0)
Eosinophils Absolute: 0.1 10*3/uL (ref 0.0–0.7)
Eosinophils Relative: 1 % (ref 0.0–5.0)
HCT: 44 % (ref 36.0–46.0)
Hemoglobin: 15 g/dL (ref 12.0–15.0)
Lymphocytes Relative: 31 % (ref 12.0–46.0)
Lymphs Abs: 2.2 10*3/uL (ref 0.7–4.0)
MCHC: 34.2 g/dL (ref 30.0–36.0)
MCV: 92.7 fL (ref 78.0–100.0)
Monocytes Absolute: 0.4 10*3/uL (ref 0.1–1.0)
Monocytes Relative: 4.9 % (ref 3.0–12.0)
Neutro Abs: 4.5 10*3/uL (ref 1.4–7.7)
Neutrophils Relative %: 62.4 % (ref 43.0–77.0)
Platelets: 296 10*3/uL (ref 150.0–400.0)
RBC: 4.75 Mil/uL (ref 3.87–5.11)
RDW: 12.8 % (ref 11.5–15.5)
WBC: 7.2 10*3/uL (ref 4.0–10.5)

## 2023-07-16 LAB — TSH: TSH: 1.29 u[IU]/mL (ref 0.35–5.50)

## 2023-07-16 LAB — LIPID PANEL
Cholesterol: 246 mg/dL — ABNORMAL HIGH (ref 0–200)
HDL: 51.9 mg/dL (ref >39.00)
LDL Cholesterol: 168 mg/dL — ABNORMAL HIGH (ref 0–99)
NonHDL: 194.23
Total CHOL/HDL Ratio: 5
Triglycerides: 129 mg/dL (ref 0.0–149.0)
VLDL: 25.8 mg/dL (ref 0.0–40.0)

## 2023-07-16 LAB — VITAMIN D 25 HYDROXY (VIT D DEFICIENCY, FRACTURES): VITD: 81.3 ng/mL (ref 30.00–100.00)

## 2023-07-16 NOTE — Assessment & Plan Note (Signed)
Chronic DEXA up-to-date Encourage regular exercise Stressed high calcium diet Continue vitamin D-check vitamin D level

## 2023-07-16 NOTE — Assessment & Plan Note (Signed)
Chronic Controlled Continue venlafaxine 37.5 mg daily

## 2023-07-16 NOTE — Assessment & Plan Note (Signed)
Chronic Has had multiple arthroscopic surgeries Takes Celebrex as needed

## 2023-07-16 NOTE — Assessment & Plan Note (Signed)
History of left-sided breast cancer No evidence of recurrence Mammogram up-to-date

## 2023-07-16 NOTE — Assessment & Plan Note (Addendum)
Chronic Check lipid panel, TSH, CMP, CBC Lifestyle controlled Heart healthy diet, regular exercise Will get CT CAC to evaluate cardiac risk

## 2023-07-17 ENCOUNTER — Encounter: Payer: Self-pay | Admitting: Internal Medicine

## 2023-07-17 DIAGNOSIS — E7800 Pure hypercholesterolemia, unspecified: Secondary | ICD-10-CM

## 2023-07-24 ENCOUNTER — Other Ambulatory Visit: Payer: Self-pay | Admitting: Internal Medicine

## 2023-07-25 ENCOUNTER — Other Ambulatory Visit: Payer: Self-pay

## 2023-07-25 MED ORDER — TIRZEPATIDE 7.5 MG/0.5ML ~~LOC~~ SOAJ: 7.5000 mg | SUBCUTANEOUS | 0 refills | Status: DC

## 2023-07-26 ENCOUNTER — Ambulatory Visit (HOSPITAL_COMMUNITY)
Admission: RE | Admit: 2023-07-26 | Discharge: 2023-07-26 | Disposition: A | Discharge status: Home / Self Care | Payer: Self-pay | Source: Ambulatory Visit | Attending: Internal Medicine | Admitting: Internal Medicine

## 2023-07-26 DIAGNOSIS — Z136 Encounter for screening for cardiovascular disorders: Secondary | ICD-10-CM | POA: Insufficient documentation

## 2023-07-30 ENCOUNTER — Ambulatory Visit: Payer: Self-pay | Admitting: Internal Medicine

## 2023-07-30 NOTE — Telephone Encounter (Addendum)
Copied from CRM [id number]. Topic: Clinical - Medication Question >> Jul 30, 2023 12:26 PM Alcus Dad wrote: Reason for CRM: Pharmacy needs to speak with Dr. To clarify the dose and quantity.  Alex from [facility] is calling to clarify a prescription. He stated the [facility] was written as 7.5 mg/0.5 ml but they do not have that option available. They have 5 mg/0.5 ml or 10 mg/0.5 ml. This RN informed Trinna Post that a message will be sent for the provider to review and the office will follow up with him.   [address] [place], [place] - [address] [place] [address] [place], [place] [address] Phone: [phone]

## 2023-08-01 ENCOUNTER — Encounter: Payer: Self-pay | Admitting: Internal Medicine

## 2023-08-01 DIAGNOSIS — E7849 Other hyperlipidemia: Secondary | ICD-10-CM

## 2023-08-01 DIAGNOSIS — I2510 Atherosclerotic heart disease of native coronary artery without angina pectoris: Secondary | ICD-10-CM | POA: Insufficient documentation

## 2023-08-01 NOTE — Telephone Encounter (Signed)
Copied from CRM [id number]. Topic: Clinical - Medication Question >> Aug 01, 2023 11:24 AM Deaijah H wrote: Reason for CRM: Patient called to return missed call from [facility] regarding prescriptions. Would like to be reached within an 1 hr / please call [phone]

## 2023-08-01 NOTE — Telephone Encounter (Signed)
Message and mychart sent to patient today

## 2023-08-06 ENCOUNTER — Other Ambulatory Visit: Payer: Self-pay

## 2023-08-06 MED ORDER — TIRZEPATIDE 5 MG/0.5ML ~~LOC~~ SOAJ: 5.0000 mg | SUBCUTANEOUS | 1 refills | Status: DC

## 2023-08-07 MED ORDER — ROSUVASTATIN CALCIUM 5 MG PO TABS: 5.0000 mg | ORAL_TABLET | Freq: Every day | ORAL | 3 refills | Status: AC

## 2023-08-12 ENCOUNTER — Telehealth: Payer: Self-pay | Admitting: Internal Medicine

## 2023-08-12 NOTE — Telephone Encounter (Signed)
 Copied from CRM [id number]. Topic: Clinical - Prescription Issue >> Aug 12, 2023 12:12 PM Pascal Lux wrote: Reason for CRM: [place] from [facility]  [facility]armacy - Received a prescription for tirzepatide [facility]) 5 MG/0.5ML Pen [[id number]] and stated they only have the vials. Is that okay for patient? Call back [phone] Fax: [phone]

## 2023-08-14 NOTE — Telephone Encounter (Signed)
,   If okay with the patient-please call her to see if she is okay with that first.

## 2023-08-19 NOTE — Telephone Encounter (Signed)
 Called and spoke with patient and she stated she was alright with the vials as she had been using these in the past. She was needing clarification regarding what dosage she should be taking. In the past she had been doing the 7.5 MG through her specialty pharmacy. They currently are out of the 7.5 and she was wanting to know if you would suggest her moving up to the 10 MG or lowering down the 5 MG as they have both.

## 2023-08-23 NOTE — Telephone Encounter (Signed)
 Called and spoke with patient informing her that we are okay for the vial. Also called [facility] and informed them of the approval form Dr.Burns and they have since switched the medication from pen to vial. Pharmacy will be following up with patient.

## 2023-08-27 ENCOUNTER — Other Ambulatory Visit: Payer: Self-pay

## 2023-08-28 ENCOUNTER — Telehealth: Payer: Self-pay | Admitting: Internal Medicine

## 2023-08-28 ENCOUNTER — Ambulatory Visit (INDEPENDENT_AMBULATORY_CARE_PROVIDER_SITE_OTHER)

## 2023-08-28 VITALS — Ht 67.0 in | Wt 154.0 lb

## 2023-08-28 DIAGNOSIS — Z1231 Encounter for screening mammogram for malignant neoplasm of breast: Secondary | ICD-10-CM

## 2023-08-28 DIAGNOSIS — Z1211 Encounter for screening for malignant neoplasm of colon: Secondary | ICD-10-CM

## 2023-08-28 DIAGNOSIS — Z853 Personal history of malignant neoplasm of breast: Secondary | ICD-10-CM | POA: Diagnosis not present

## 2023-08-28 DIAGNOSIS — Z0000 Encounter for general adult medical examination without abnormal findings: Secondary | ICD-10-CM | POA: Diagnosis not present

## 2023-08-28 NOTE — Progress Notes (Signed)
 Subjective:   Emily Knight is a 68 y.o. who presents for a Medicare Wellness preventive visit.  Visit Complete: Virtual I connected with  Emily Knight on 08/28/23 by a audio enabled telemedicine application and verified that I am speaking with the correct person using two identifiers.  Patient Location: Home  Provider Location: Office/Clinic  I discussed the limitations of evaluation and management by telemedicine. The patient expressed understanding and agreed to proceed.  Vital Signs: Because this visit was a virtual/telehealth visit, some criteria may be missing or patient reported. Any vitals not documented were not able to be obtained and vitals that have been documented are patient reported.  VideoDeclined- This patient declined Librarian, academic. Therefore the visit was completed with audio only.  AWV Questionnaire: No: Patient Medicare AWV questionnaire was not completed prior to this visit.  Cardiac Risk Factors include: advanced age (>66men, >74 women);dyslipidemia     Objective:    Today's Vitals   08/28/23 0854  Weight: 154 lb (69.9 kg)  Height: 5\' 7"  (1.702 m)   Body mass index is 24.12 kg/m.     08/28/2023    8:53 AM 05/21/2022    3:15 PM 08/04/2021   11:55 AM 03/12/2016    2:43 PM 03/10/2015    2:47 PM 09/02/2014   11:43 PM 05/27/2012    4:35 PM  Advanced Directives  Does Patient Have a Medical Advance Directive? Yes Yes No Yes Yes Yes Patient does not have advance directive  Type of Advance Directive Healthcare Power of [place];Living will Healthcare Power of [place];Living will  Healthcare Power of [employer] of [place];Living will   Copy of Healthcare Power of Attorney in Chart? No - copy requested No - copy requested         Current Medications (verified) Outpatient Encounter Medications as of 08/28/2023  Medication Sig   Biotin 1000 MCG tablet Take 1,000 mcg by mouth 2 (two) times daily.    celecoxib (CELEBREX) 200 MG capsule TAKE 1 CAPSULE BY MOUTH DAILY   Cholecalciferol (VITAMIN D3) 125 MCG (5000 UT) CAPS Take 5,000 Units by mouth daily.   Probiotic Product (PROBIOTIC DAILY PO) Take by mouth.   rosuvastatin (CRESTOR) 5 MG tablet Take 1 tablet (5 mg total) by mouth daily.   tirzepatide [facility]) 5 MG/0.5ML Pen Inject 5 mg into the skin once a week.   venlafaxine XR (EFFEXOR-XR) 37.5 MG 24 hr capsule TAKE 1 CAPSULE BY MOUTH DAILY  WITH BREAKFAST   [DISCONTINUED] tirzepatide (MOUNJARO) 7.5 MG/0.5ML Pen Inject 7.5 mg into the skin once a week.   No facility-administered encounter medications on file as of 08/28/2023.    Allergies (verified) Other, Pholcodine, and Codeine   History: Past Medical History:  Diagnosis Date   Allergy    Anxiety    Arthritis    left knee   Breast cancer (HCC) 08/14/11 [facility]   Left breast lumpectomy/left axillary lymph node resection,invasive Grade II Lobular CA in Situ,,left lymph node bx==positive for metastatic lobular ca(2/2)   Cancer (HCC)    left breast   Carotid artery aneurysm (HCC)    left internal carotid artery; last MRI 02/22/2011   Cerebral aneurysm    dx by cerebral angiogram,encased in bone   History of chemotherapy final 02/05/12   4 cycles Adriamycin/Cytoxan and 12 Weeks of Taxol   Hot flashes    Hot flushes, perimenopausal    S/P radiation therapy 02/12/12 -03/26/12   Left Breast, Left Axilla and  Left Supraclivcular Region   Stress fracture    left foot/ankle   Stress fracture of the metatarsals 11/09/2009   Qualifier: Diagnosis of  By: Darrick Penna MD, KARL     Use of tamoxifen (Nolvadex) Start - 04/10/12   Past Surgical History:  Procedure Laterality Date   ABDOMINAL HYSTERECTOMY  1980's   ovaries intact   AXILLARY LYMPH NODE DISSECTION  08/28/2011   Procedure: AXILLARY LYMPH NODE DISSECTION;  Surgeon: Emelia Loron, MD;  Location: [facility];  Service: General;  Laterality: Left;  Left axillary node  dissection, port placement   BREAST LUMPECTOMY  08/14/11   Left breast lumpectomy, left axillary sentinel node biopsy   left knee reconstruction     six surgeries (3 orthoscopic) 3 open, ACL   PARTIAL HYSTERECTOMY  1984? 1989?    for fibroids    PORT-A-CATH REMOVAL  05/29/2012   Procedure: REMOVAL PORT-A-CATH;  Surgeon: Emelia Loron, MD;  Location: [facility];  Service: General;  Laterality: N/A;   PORTACATH PLACEMENT  08/28/2011   Procedure: INSERTION PORT-A-CATH;  Surgeon: Emelia Loron, MD;  Location: [facility];  Service: General;  Laterality: Right;   TONSILLECTOMY AND ADENOIDECTOMY  1971   TRIGGER FINGER RELEASE  06/29/2008   release A-1 pulley and exc. cyst left thumb   Family History  Problem Relation Age of Onset   Anesthesia problems Mother        post-op N/V   Cancer Father 87       lung - ages 69 and 69; prostate   Cancer Maternal Aunt 97       ovarian   Cancer Paternal Aunt        lung   Cancer Paternal Uncle        bone cancer   Heart disease Maternal Grandfather    Melanoma Cousin 49       female ,skin   Social History   Socioeconomic History   Marital status: Married    Spouse name: Not on file   Number of children: Not on file   Years of education: Not on file   Highest education level: Bachelor's degree (e.g., BA, AB, BS)  Occupational History   Occupation: Retired Librarian, academic  Tobacco Use   Smoking status: Former    Current packs/day: 0.00    Average packs/day: 1 pack/day for 15.0 years (15.0 ttl pk-yrs)    Types: Cigarettes    Start date: 01/23/1986    Quit date: 01/23/2001    Years since quitting: 22.6    Passive exposure: Past   Smokeless tobacco: Never   Tobacco comments:    quit smoking 10 yrs. ago  Vaping Use   Vaping status: Not on file  Substance and Sexual Activity   Alcohol use: Yes    Alcohol/week: 4.0 standard drinks of alcohol    Types: 4 Cans of beer per week    Comment: drink about 4 per  week   Drug use: No    Comment: quit smoking 2002, smoked for 10 quit restarted for 5 more   Sexual activity: Not on file    Comment: hrt 1 year    Other Topics Concern   Not on file  Social History Narrative   Retired Emergency planning/management officer x 30 yrs, single   Social Drivers of Corporate investment banker Strain: Low Risk  (08/28/2023)   Overall Financial Resource Strain (CARDIA)    Difficulty of Paying Living Expenses: Not hard at all  Food Insecurity: No Food Insecurity (08/28/2023)   Hunger Vital Sign    Worried About Running Out of Food in the Last Year: Never true    Ran Out of Food in the Last Year: Never true  Transportation Needs: No Transportation Needs (08/28/2023)   PRAPARE - Administrator, Civil Service (Medical): No    Lack of Transportation (Non-Medical): No  Physical Activity: Sufficiently Active (08/28/2023)   Exercise Vital Sign    Days of Exercise per Week: 7 days    Minutes of Exercise per Session: 60 min  Recent Concern: Physical Activity - Insufficiently Active (07/15/2023)   Exercise Vital Sign    Days of Exercise per Week: 4 days    Minutes of Exercise per Session: 30 min  Stress: No Stress Concern Present (08/28/2023)   [employer] of Occupational Health - Occupational Stress Questionnaire    Feeling of Stress : Not at all  Recent Concern: Stress - Stress Concern Present (07/15/2023)   [employer] of Occupational Health - Occupational Stress Questionnaire    Feeling of Stress : Rather much  Social Connections: Moderately Isolated (08/28/2023)   Social Connection and Isolation Panel [NHANES]    Frequency of Communication with Friends and Family: More than three times a week    Frequency of Social Gatherings with Friends and Family: More than three times a week    Attends Religious Services: Never    Database administrator or Organizations: No    Attends Banker Meetings: Never    Marital Status: Married    Tobacco Counseling -  Former Smoker Counseling given: No Tobacco comments: quit smoking 10 yrs. ago    Clinical Intake:  Pre-visit preparation completed: Yes  Pain : No/denies pain     BMI - recorded: 24.12 Nutritional Status: BMI of 19-24  Normal Nutritional Risks: None Diabetes: No  How often do you need to have someone help you when you read instructions, pamphlets, or other written materials from your doctor or pharmacy?: 1 - Never  Interpreter Needed?: No  Information entered by :: Hassell Halim, CMA   Activities of Daily Living     08/28/2023    9:03 AM  In your present state of health, do you have any difficulty performing the following activities:  Hearing? 0  Vision? 0  Difficulty concentrating or making decisions? 0  Walking or climbing stairs? 0  Dressing or bathing? 0  Doing errands, shopping? 0  Preparing Food and eating ? N  Using the Toilet? N  In the past six months, have you accidently leaked urine? N  Do you have problems with loss of bowel control? N  Managing your Medications? N  Managing your Finances? N  Housekeeping or managing your Housekeeping? N    Patient Care Team: Pincus Sanes, MD as PCP - General (Internal Medicine) Charna Elizabeth, MD as Consulting Physician (Gastroenterology) Antony Contras, MD as Consulting Physician (Ophthalmology)  Indicate any recent Medical Services you may have received from other than [facility] providers in the past year (date may be approximate).     Assessment:   This is a routine wellness examination for Emily Knight.  Hearing/Vision screen Hearing Screening - Comments:: Denies hearing difficulties   Vision Screening - Comments:: Wears rx glasses - up to date with routine eye exams with Dr Antony Contras   Goals Addressed               This Visit's Progress     Patient  Stated (pt-stated)        Patient stated she plans to stay active and ride bike more and play golf.       Depression Screen     08/28/2023    9:08 AM  08/29/2022    2:55 PM 08/09/2022   11:26 AM 05/21/2022    3:27 PM 02/23/2022    9:14 AM 09/15/2021   10:55 AM 09/14/2020    9:46 AM  PHQ 2/9 Scores  PHQ - 2 Score 0 2 0 0 0 1 0  PHQ- 9 Score 0 8         Fall Risk     08/28/2023    9:04 AM 08/29/2022    2:55 PM 08/09/2022   11:26 AM 05/21/2022    3:16 PM 09/15/2021   10:55 AM  Fall Risk   Falls in the past year? 0 1 0 0 0  Number falls in past yr: 0 0 0 0 0  Injury with Fall? 0 1 0 0 0  Risk for fall due to : No Fall Risks History of fall(s) No Fall Risks No Fall Risks No Fall Risks  Follow up Falls prevention discussed;Falls evaluation completed Falls evaluation completed Falls evaluation completed Falls prevention discussed Falls evaluation completed    MEDICARE RISK AT HOME:  Medicare Risk at Home Any stairs in or around the home?: Yes (outside) If so, are there any without handrails?: No Home free of loose throw rugs in walkways, pet beds, electrical cords, etc?: Yes Adequate lighting in your home to reduce risk of falls?: Yes Life alert?: No Use of a cane, walker or w/c?: No Grab bars in the bathroom?: No Shower chair or bench in shower?: No Elevated toilet seat or a handicapped toilet?: No  TIMED UP AND GO:  Was the test performed?  No  Cognitive Function: 6CIT completed        08/28/2023    9:05 AM 05/21/2022    3:38 PM  6CIT Screen  What Year? 0 points 0 points  What month? 0 points 0 points  What time? 0 points 0 points  Count back from 20 0 points 0 points  Months in reverse 0 points 0 points  Repeat phrase 2 points 0 points  Total Score 2 points 0 points    Immunizations Immunization History  Administered Date(s) Administered   Fluad Quad(high Dose 65+) 04/01/2022   Influenza, High Dose Seasonal PF 04/08/2023   PFIZER(Purple Top)SARS-COV-2 Vaccination 07/10/2019, 07/31/2019, 03/05/2020, 03/10/2021   PNEUMOCOCCAL CONJUGATE-20 07/16/2023   Td 09/12/2016    Screening Tests Health Maintenance  Topic  Date Due   Colonoscopy  Never done   COVID-19 Vaccine (5 - 2024-25 season) 02/17/2023   Zoster Vaccines- Shingrix (1 of 2) 10/14/2023 (Originally 12/31/1974)   Medicare Annual Wellness (AWV)  08/27/2024   MAMMOGRAM  11/21/2024   DEXA SCAN  11/28/2024   DTaP/Tdap/Td (2 - Tdap) 09/13/2026   Pneumonia Vaccine 58+ Years old  Completed   INFLUENZA VACCINE  Completed   HPV VACCINES  Aged Out   Hepatitis C Screening  Discontinued    Health Maintenance  Health Maintenance Due  Topic Date Due   Colonoscopy  Never done   COVID-19 Vaccine (5 - 2024-25 season) 02/17/2023   Health Maintenance Items Addressed: 08/28/2023 Mammogram ordered  Additional Screening:  Vision Screening: Recommended annual ophthalmology exams for early detection of glaucoma and other disorders of the eye. Pt stated she'd had a routine eye exam on 08/27/2023 w/Dr Cheree Ditto  Lyles.    Dental Screening: Recommended annual dental exams for proper oral hygiene  Community Resource Referral / Chronic Care Management: CRR required this visit?  No   CCM required this visit?  No     Plan:     I have personally reviewed and noted the following in the patient's chart:   Medical and social history Use of alcohol, tobacco or illicit drugs  Current medications and supplements including opioid prescriptions. Patient is not currently taking opioid prescriptions. Functional ability and status Nutritional status Physical activity Advanced directives List of other physicians Hospitalizations, surgeries, and ER visits in previous 12 months Vitals Screenings to include cognitive, depression, and falls Referrals and appointments  In addition, I have reviewed and discussed with patient certain preventive protocols, quality metrics, and best practice recommendations. A written personalized care plan for preventive services as well as general preventive health recommendations were provided to patient.     Darreld Mclean,  CMA   08/28/2023   After Visit Summary: (MyChart) Due to this being a telephonic visit, the after visit summary with patients personalized plan was offered to patient via MyChart   Notes: Please refer to Routing Comments.

## 2023-08-28 NOTE — Patient Instructions (Addendum)
 Emily Knight , Thank you for taking time to come for your Medicare Wellness Visit. I appreciate your ongoing commitment to your health goals. Please review the following plan we discussed and let me know if I can assist you in the future.   Referrals/Orders/Follow-Ups/Clinician Recommendations: Aim for 30 minutes of exercise or brisk walking, 6-8 glasses of water, and 5 servings of fruits and vegetables each day. Ordered a repeat Mammogram and a referral for a Screening Colonoscopy with [facility].  This is a list of the screening recommended for you and due dates:  Health Maintenance  Topic Date Due   Colon Cancer Screening  Never done   COVID-19 Vaccine (5 - 2024-25 season) 02/17/2023   Zoster (Shingles) Vaccine (1 of 2) 10/14/2023*   Medicare Annual Wellness Visit  08/27/2024   Mammogram  11/21/2024   DEXA scan (bone density measurement)  11/28/2024   DTaP/Tdap/Td vaccine (2 - Tdap) 09/13/2026   Pneumonia Vaccine  Completed   Flu Shot  Completed   HPV Vaccine  Aged Out   Hepatitis C Screening  Discontinued  *Topic was postponed. The date shown is not the original due date.    Advanced directives: (Copy Requested) Please bring a copy of your health care power of attorney and living will to the office to be added to your chart at your convenience. You can mail to [facility] [address] [place], [place] [address] or email to [email or web address]  Next Medicare Annual Wellness Visit scheduled for next year: Yes - 2026

## 2023-08-28 NOTE — Telephone Encounter (Unsigned)
 Copied from CRM [id number]. Topic: General - Other >> Aug 28, 2023  9:22 AM Emily Knight wrote: Reason for CRM: patient is needing for the nurse to give her a call back regarding her medication she switched insurance and need's to get her pharmacy straighten out she also stated she only has 7 pills for her depression left she needs those sent over to a local pharmacy

## 2023-08-30 ENCOUNTER — Other Ambulatory Visit: Payer: Self-pay

## 2023-08-30 MED ORDER — VENLAFAXINE HCL ER 37.5 MG PO CP24: 37.5000 mg | ORAL_CAPSULE | Freq: Every day | ORAL | 1 refills | Status: DC

## 2023-08-30 NOTE — Telephone Encounter (Signed)
 Copied from CRM [id number]. Topic: General - Other >> Aug 30, 2023  9:19 AM Elizebeth Brooking wrote: Reason for CRM: Patient called in wanting to leave Nurse Albin Felling a message, Wanted to let her know that the refill needs to be sent to  [facility] [address] [place], [place], [place] [address]

## 2023-08-30 NOTE — Telephone Encounter (Signed)
 Refill sent today to [facility]

## 2023-08-30 NOTE — Telephone Encounter (Signed)
 Message left for patient to return call to clinic to confirm what pharmacy she wanted refill sent to.

## 2023-09-30 ENCOUNTER — Encounter: Payer: Self-pay | Admitting: Internal Medicine

## 2023-10-02 MED ORDER — NONFORMULARY OR COMPOUNDED ITEM: 0 refills | Status: AC

## 2023-11-13 DIAGNOSIS — H5203 Hypermetropia, bilateral: Secondary | ICD-10-CM | POA: Diagnosis not present

## 2023-11-13 DIAGNOSIS — H40013 Open angle with borderline findings, low risk, bilateral: Secondary | ICD-10-CM | POA: Diagnosis not present

## 2023-11-25 ENCOUNTER — Encounter: Payer: Self-pay | Admitting: Internal Medicine

## 2023-11-25 DIAGNOSIS — Z1231 Encounter for screening mammogram for malignant neoplasm of breast: Secondary | ICD-10-CM | POA: Diagnosis not present

## 2023-11-25 LAB — HM MAMMOGRAPHY

## 2023-11-25 MED ORDER — SCOPOLAMINE 1 MG/3DAYS TD PT72: 1.0000 | MEDICATED_PATCH | TRANSDERMAL | 0 refills | Status: AC

## 2023-11-26 ENCOUNTER — Telehealth: Payer: Self-pay | Admitting: Pharmacist

## 2023-11-26 NOTE — Telephone Encounter (Signed)
 Pharmacy Patient Advocate Encounter   Received notification from Patient Pharmacy that prior authorization for Scopolamine  1MG /3DAYS 72 hr patches is required/requested.   Insurance verification completed.   The patient is insured through [facility] .   Per test claim: PA required; PA submitted to above mentioned insurance via CoverMyMeds Key/confirmation #/EOC [id number] Status is pending

## 2023-11-26 NOTE — Telephone Encounter (Signed)
 Pharmacy Patient Advocate Encounter  Received notification from [facility] that Prior Authorization for Scopolamine  1MG /3DAYS 72 hr patches has been APPROVED from 07/20/2023 to 06/17/2024   PA #/Case ID/Reference #: [id number]

## 2024-02-21 ENCOUNTER — Other Ambulatory Visit: Payer: Self-pay | Admitting: Internal Medicine

## 2024-03-12 DIAGNOSIS — M25562 Pain in left knee: Secondary | ICD-10-CM | POA: Diagnosis not present

## 2024-03-19 DIAGNOSIS — Z1211 Encounter for screening for malignant neoplasm of colon: Secondary | ICD-10-CM | POA: Diagnosis not present

## 2024-03-30 ENCOUNTER — Encounter: Payer: Self-pay | Admitting: Internal Medicine

## 2024-04-09 ENCOUNTER — Other Ambulatory Visit: Payer: Self-pay | Admitting: Internal Medicine

## 2024-04-09 NOTE — Telephone Encounter (Unsigned)
 Copied from CRM [id number]. Topic: Clinical - Medication Refill >> Apr 09, 2024  4:22 PM Rea C wrote: Medication: tirzepatide  (MOUNJARO ) 5 MG/0.5ML Pen  Has the patient contacted their pharmacy? Pharmacy called for refill.  (Agent: If no, request that the patient contact the pharmacy for the refill. If patient does not wish to contact the pharmacy document the reason why and proceed with request.) (Agent: If yes, when and what did the pharmacy advise?)  This is the patient's preferred pharmacy:  [address] [place], [place] - [address] [place] [address] [place] [address] Phone: [phone] Fax: [phone]  Is this the correct pharmacy for this prescription? Yes If no, delete pharmacy and type the correct one.   Has the prescription been filled recently? Yes  Is the patient out of the medication? Yes  Has the patient been seen for an appointment in the last year OR does the patient have an upcoming appointment? Yes  Can we respond through MyChart? Yes  Agent: Please be advised that Rx refills may take up to 3 business days. We ask that you follow-up with your pharmacy.

## 2024-04-09 NOTE — Progress Notes (Unsigned)
    Subjective:    Patient ID: Emily Knight, female    DOB: Jun 30, 1955, 68 y.o.   MRN: [id number]      HPI Emily Knight is here for No chief complaint on file.  Insomnia      Medications and allergies reviewed with patient and updated if appropriate.  Current Outpatient Medications on File Prior to Visit  Medication Sig Dispense Refill   Biotin 1000 MCG tablet Take 1,000 mcg by mouth 2 (two) times daily.     celecoxib  (CELEBREX ) 200 MG capsule TAKE 1 CAPSULE BY MOUTH DAILY 90 capsule 3   Cholecalciferol (VITAMIN D3) 125 MCG (5000 UT) CAPS Take 5,000 Units by mouth daily.     NONFORMULARY OR COMPOUNDED ITEM Tirzepatide  10mg /0.5 cc plus B6. Inject 0.375 cc (37.5 units) once per week Disp 1.5 ml   refill 5 1 each 0   Probiotic Product (PROBIOTIC DAILY PO) Take by mouth.     rosuvastatin  (CRESTOR ) 5 MG tablet Take 1 tablet (5 mg total) by mouth daily. 90 tablet 3   scopolamine  (TRANSDERM-SCOP) 1 MG/3DAYS Place 1 patch (1.5 mg total) onto the skin every 3 (three) days. 4 patch 0   tirzepatide  (MOUNJARO ) 5 MG/0.5ML Pen Inject 5 mg into the skin once a week. 2 mL 1   venlafaxine  XR (EFFEXOR -XR) 37.5 MG 24 hr capsule TAKE 1 CAPSULE BY MOUTH ONCE DAILY WITH BREAKFAST 90 capsule 0   No current facility-administered medications on file prior to visit.    Review of Systems     Objective:  There were no vitals filed for this visit. BP Readings from Last 3 Encounters:  07/16/23 118/74  08/29/22 122/78  08/09/22 122/80   Wt Readings from Last 3 Encounters:  08/28/23 154 lb (69.9 kg)  07/16/23 158 lb (71.7 kg)  08/29/22 157 lb (71.2 kg)   There is no height or weight on file to calculate BMI.    Physical Exam         Assessment & Plan:    See Problem List for Assessment and Plan of chronic medical problems.

## 2024-04-10 ENCOUNTER — Encounter: Payer: Self-pay | Admitting: Internal Medicine

## 2024-04-10 ENCOUNTER — Ambulatory Visit: Admitting: Internal Medicine

## 2024-04-10 VITALS — BP 122/80 | HR 81 | Temp 98.0°F | Ht 67.0 in | Wt 159.0 lb

## 2024-04-10 DIAGNOSIS — G4700 Insomnia, unspecified: Secondary | ICD-10-CM | POA: Insufficient documentation

## 2024-04-10 DIAGNOSIS — M7918 Myalgia, other site: Secondary | ICD-10-CM

## 2024-04-10 DIAGNOSIS — F5101 Primary insomnia: Secondary | ICD-10-CM

## 2024-04-10 MED ORDER — TIRZEPATIDE 5 MG/0.5ML ~~LOC~~ SOAJ: 5.0000 mg | SUBCUTANEOUS | 1 refills | Status: DC

## 2024-04-10 MED ORDER — TRAZODONE HCL 50 MG PO TABS
50.0000 mg | ORAL_TABLET | Freq: Every day | ORAL | 1 refills | Status: AC
Start: 2024-04-10 — End: ?

## 2024-04-10 NOTE — Assessment & Plan Note (Signed)
 Chronic Has had bilateral buttock pain for a while Do not think this is radiculopathy-I think it is more muscular I think she has significant decreased flexibility as well Referral to sports medicine [facility] Improving this will improve her overall function but also hopefully improve her sleep

## 2024-04-10 NOTE — Assessment & Plan Note (Signed)
 Chronic Has been taking NyQuil for years-Will stop Trial of trazodone 50 mg nightly-May need to adjust the dose If this is not effective we will try something different

## 2024-04-10 NOTE — Patient Instructions (Signed)
       Medications changes include :   trazodone 50 mg nightly    A referral was ordered for Dr Arnaldo at [facility] and someone will call you to schedule an appointment.

## 2024-05-01 ENCOUNTER — Telehealth: Payer: Self-pay

## 2024-05-01 NOTE — Telephone Encounter (Signed)
 Copied from CRM #[id number]. Topic: Clinical - Medication Question >> May 01, 2024 12:25 PM Suzen RAMAN wrote: Reason for CRM: Victoria from [employer] would like a call back for clarification on  tirzepatide  (MOUNJARO ) 7.5 MG/0.5ML Pen and Cholecalciferol (VITAMIN D3) 125 MCG (5000 UT) CAPS    CB# [phone]

## 2024-05-06 DIAGNOSIS — K648 Other hemorrhoids: Secondary | ICD-10-CM | POA: Diagnosis not present

## 2024-05-06 DIAGNOSIS — K635 Polyp of colon: Secondary | ICD-10-CM | POA: Diagnosis not present

## 2024-05-06 DIAGNOSIS — D125 Benign neoplasm of sigmoid colon: Secondary | ICD-10-CM | POA: Diagnosis not present

## 2024-05-06 DIAGNOSIS — D120 Benign neoplasm of cecum: Secondary | ICD-10-CM | POA: Diagnosis not present

## 2024-05-06 DIAGNOSIS — Z1211 Encounter for screening for malignant neoplasm of colon: Secondary | ICD-10-CM | POA: Diagnosis not present

## 2024-05-06 LAB — HM COLONOSCOPY

## 2024-05-07 DIAGNOSIS — M47816 Spondylosis without myelopathy or radiculopathy, lumbar region: Secondary | ICD-10-CM | POA: Diagnosis not present

## 2024-05-07 DIAGNOSIS — M5459 Other low back pain: Secondary | ICD-10-CM | POA: Diagnosis not present

## 2024-05-08 ENCOUNTER — Other Ambulatory Visit: Payer: Self-pay

## 2024-05-11 ENCOUNTER — Other Ambulatory Visit: Payer: Self-pay

## 2024-05-11 MED ORDER — MOUNJARO 7.5 MG/0.5ML ~~LOC~~ SOAJ: 7.5000 mg | SUBCUTANEOUS | 1 refills | Status: AC

## 2024-05-11 MED ORDER — VITAMIN D3 125 MCG (5000 UT) PO CAPS: 5000.0000 [IU] | ORAL_CAPSULE | Freq: Every day | ORAL | 5 refills | Status: AC

## 2024-05-11 NOTE — Telephone Encounter (Signed)
Scripts updated and sent today

## 2024-05-21 ENCOUNTER — Telehealth: Payer: Self-pay

## 2024-05-21 NOTE — Telephone Encounter (Signed)
 Copied from CRM #[id number]. Topic: Clinical - Prescription Issue >> May 21, 2024 10:19 AM Tanazia G wrote: Reason for CRM: Pharmacy Tech calling from [facility] stated that they are a compound pharmacy, so they don't make the brand name of tirzepatide  (MOUNJARO ) 7.5 MG/0.5ML Pen, but they are asking if they can substitute it instead. Can be reached back at [phone].  tirzepatide  (MOUNJARO ) 7.5 MG/0.5ML Pen

## 2024-05-25 ENCOUNTER — Other Ambulatory Visit: Payer: Self-pay | Admitting: Internal Medicine

## 2024-05-25 ENCOUNTER — Encounter: Payer: Self-pay | Admitting: Internal Medicine

## 2024-05-26 NOTE — Telephone Encounter (Signed)
 Yes, ok

## 2024-05-28 NOTE — Telephone Encounter (Signed)
 Spoke with Garrel today from pharmacy today and they will proceed with compounding script for 3 month supply to include B6 to reduce nausea.

## 2024-06-16 ENCOUNTER — Telehealth

## 2024-06-16 ENCOUNTER — Ambulatory Visit: Payer: Self-pay

## 2024-06-16 NOTE — Telephone Encounter (Signed)
 Called and left message for patient.  Looks like she cancelled video visit for today.

## 2024-06-16 NOTE — Telephone Encounter (Signed)
 FYI Only or Action Required?: FYI only for provider: pt has vv scheduled for this afternoon. Pt may go to [facility] this morning.  Patient was last seen in primary care on 04/10/2024 by Geofm Glade PARAS, MD.  Called Nurse Triage reporting Shortness of Breath. Cough, HA  Symptoms began Sunday.  Interventions attempted: OTC medications: and also rx cough medication.  Symptoms are: gradually worsening.  Triage Disposition: See HCP Within 4 Hours (Or PCP Triage)  Patient/caregiver understands and will follow disposition?: Yes                            Copied from CRM #[id number]. Topic: Clinical - Red Word Triage >> Jun 16, 2024  7:43 AM Deleta RAMAN wrote: Red Word that prompted transfer to Nurse Triage: patient is having trouble breathing and congestion. She has a virtual urgent care visit and would like to be seen early than 2:15 on today Reason for Disposition  [1] MILD difficulty breathing (e.g., minimal/no SOB at rest, SOB with walking, pulse < 100) AND [2] NEW-onset or WORSE than normal  Answer Assessment - Initial Assessment Questions 1. RESPIRATORY STATUS: Describe your breathing? (e.g., wheezing, shortness of breath, unable to speak, severe coughing)      Chest tightness 2. ONSET: When did this breathing problem begin?      Sunday 3. PATTERN Does the difficult breathing come and go, or has it been constant since it started?      constant 4. SEVERITY: How bad is your breathing? (e.g., mild, moderate, severe)      Mild moderate 5. RECURRENT SYMPTOM: Have you had difficulty breathing before? If Yes, ask: When was the last time? and What happened that time?      no 6. CARDIAC HISTORY: Do you have any history of heart disease? (e.g., heart attack, angina, bypass surgery, angioplasty)      no 7. LUNG HISTORY: Do you have any history of lung disease?  (e.g., pulmonary embolus, asthma, emphysema)     no 8. CAUSE: What do you think is causing the  breathing problem?      URI 9. OTHER SYMPTOMS: Do you have any other symptoms? (e.g., chest pain, cough, dizziness, fever, runny nose)     Cough, HA 10. O2 SATURATION MONITOR:  Do you use an oxygen saturation monitor (pulse oximeter) at home? If Yes, ask: What is your reading (oxygen level) today? What is your usual oxygen saturation reading? (e.g., 95%)       Usually very good  Protocols used: Breathing Difficulty-A-AH

## 2024-09-08 ENCOUNTER — Ambulatory Visit

## 2024-09-08 ENCOUNTER — Encounter: Admitting: Internal Medicine
